# Patient Record
Sex: Female | Born: 1935 | Race: White | Hispanic: Refuse to answer | Smoking: Former smoker
Health system: Southern US, Community
[De-identification: ages and names within clinical notes are randomized; demographics above are authoritative.]

## PROBLEM LIST (undated history)

## (undated) DIAGNOSIS — I509 Heart failure, unspecified: Secondary | ICD-10-CM

## (undated) DIAGNOSIS — I2510 Atherosclerotic heart disease of native coronary artery without angina pectoris: Secondary | ICD-10-CM

## (undated) DIAGNOSIS — I482 Chronic atrial fibrillation, unspecified: Secondary | ICD-10-CM

## (undated) DIAGNOSIS — M792 Neuralgia and neuritis, unspecified: Secondary | ICD-10-CM

## (undated) DIAGNOSIS — I219 Acute myocardial infarction, unspecified: Secondary | ICD-10-CM

## (undated) DIAGNOSIS — I10 Essential (primary) hypertension: Secondary | ICD-10-CM

## (undated) DIAGNOSIS — M1990 Unspecified osteoarthritis, unspecified site: Secondary | ICD-10-CM

## (undated) DIAGNOSIS — E785 Hyperlipidemia, unspecified: Secondary | ICD-10-CM

## (undated) HISTORY — DX: Acute myocardial infarction, unspecified: I21.9

## (undated) HISTORY — PX: LUMBAR SPINE SURGERY: SHX701

## (undated) HISTORY — DX: Chronic atrial fibrillation, unspecified: I48.20

## (undated) HISTORY — DX: Atherosclerotic heart disease of native coronary artery without angina pectoris: I25.10

## (undated) HISTORY — DX: Heart failure, unspecified: I50.9

## (undated) HISTORY — PX: APPENDECTOMY: SHX54

---

## 2003-12-30 ENCOUNTER — Ambulatory Visit: Payer: Self-pay | Admitting: General Practice

## 2004-02-16 ENCOUNTER — Other Ambulatory Visit: Payer: Self-pay

## 2004-03-08 ENCOUNTER — Inpatient Hospital Stay: Payer: Self-pay | Admitting: General Practice

## 2004-06-10 ENCOUNTER — Inpatient Hospital Stay: Payer: Self-pay | Admitting: General Practice

## 2004-07-04 ENCOUNTER — Emergency Department: Payer: Self-pay | Admitting: Emergency Medicine

## 2005-03-04 ENCOUNTER — Ambulatory Visit: Payer: Self-pay | Admitting: Internal Medicine

## 2008-02-27 ENCOUNTER — Emergency Department: Payer: Self-pay | Admitting: Unknown Physician Specialty

## 2008-10-20 ENCOUNTER — Emergency Department: Payer: Self-pay | Admitting: Emergency Medicine

## 2009-03-31 ENCOUNTER — Emergency Department: Payer: Self-pay | Admitting: Emergency Medicine

## 2011-07-11 ENCOUNTER — Emergency Department: Payer: Self-pay | Admitting: Emergency Medicine

## 2011-07-11 LAB — CBC WITH DIFFERENTIAL/PLATELET
Basophil %: 0.5 %
HCT: 39.4 % (ref 35.0–47.0)
HGB: 13.5 g/dL (ref 12.0–16.0)
MCH: 31.4 pg (ref 26.0–34.0)
MCHC: 34.3 g/dL (ref 32.0–36.0)
Monocyte %: 9 %
Neutrophil #: 8 10*3/uL — ABNORMAL HIGH (ref 1.4–6.5)
Neutrophil %: 71.7 %
Platelet: 213 10*3/uL (ref 150–440)
RDW: 14.3 % (ref 11.5–14.5)

## 2011-07-11 LAB — BASIC METABOLIC PANEL
Anion Gap: 6 — ABNORMAL LOW (ref 7–16)
Co2: 28 mmol/L (ref 21–32)
Creatinine: 0.76 mg/dL (ref 0.60–1.30)
EGFR (African American): >60
EGFR (Non-African Amer.): >60
Glucose: 99 mg/dL (ref 65–99)
Osmolality: 279 (ref 275–301)
Potassium: 3.4 mmol/L — ABNORMAL LOW (ref 3.5–5.1)
Sodium: 139 mmol/L (ref 136–145)

## 2013-01-04 ENCOUNTER — Emergency Department: Payer: Self-pay | Admitting: Emergency Medicine

## 2015-04-19 ENCOUNTER — Inpatient Hospital Stay
Admission: EM | Admit: 2015-04-19 | Discharge: 2015-04-25 | DRG: 281 | Disposition: A | Discharge status: Home Health | Payer: Medicare Other | Attending: Internal Medicine | Admitting: Internal Medicine

## 2015-04-19 ENCOUNTER — Encounter: Payer: Self-pay | Admitting: Internal Medicine

## 2015-04-19 ENCOUNTER — Emergency Department: Payer: Medicare Other

## 2015-04-19 DIAGNOSIS — E669 Obesity, unspecified: Secondary | ICD-10-CM | POA: Diagnosis present

## 2015-04-19 DIAGNOSIS — Z66 Do not resuscitate: Secondary | ICD-10-CM | POA: Diagnosis present

## 2015-04-19 DIAGNOSIS — I482 Chronic atrial fibrillation: Secondary | ICD-10-CM | POA: Diagnosis present

## 2015-04-19 DIAGNOSIS — I2510 Atherosclerotic heart disease of native coronary artery without angina pectoris: Secondary | ICD-10-CM | POA: Diagnosis present

## 2015-04-19 DIAGNOSIS — I481 Persistent atrial fibrillation: Secondary | ICD-10-CM | POA: Diagnosis present

## 2015-04-19 DIAGNOSIS — R778 Other specified abnormalities of plasma proteins: Secondary | ICD-10-CM

## 2015-04-19 DIAGNOSIS — I214 Non-ST elevation (NSTEMI) myocardial infarction: Secondary | ICD-10-CM | POA: Diagnosis present

## 2015-04-19 DIAGNOSIS — I110 Hypertensive heart disease with heart failure: Secondary | ICD-10-CM | POA: Diagnosis present

## 2015-04-19 DIAGNOSIS — I5021 Acute systolic (congestive) heart failure: Secondary | ICD-10-CM | POA: Diagnosis present

## 2015-04-19 DIAGNOSIS — I509 Heart failure, unspecified: Secondary | ICD-10-CM | POA: Insufficient documentation

## 2015-04-19 DIAGNOSIS — I5043 Acute on chronic combined systolic (congestive) and diastolic (congestive) heart failure: Secondary | ICD-10-CM | POA: Diagnosis present

## 2015-04-19 DIAGNOSIS — Z87891 Personal history of nicotine dependence: Secondary | ICD-10-CM | POA: Diagnosis not present

## 2015-04-19 DIAGNOSIS — I447 Left bundle-branch block, unspecified: Secondary | ICD-10-CM | POA: Diagnosis present

## 2015-04-19 DIAGNOSIS — G4730 Sleep apnea, unspecified: Secondary | ICD-10-CM | POA: Diagnosis present

## 2015-04-19 DIAGNOSIS — I4819 Other persistent atrial fibrillation: Secondary | ICD-10-CM | POA: Diagnosis present

## 2015-04-19 DIAGNOSIS — E785 Hyperlipidemia, unspecified: Secondary | ICD-10-CM | POA: Diagnosis present

## 2015-04-19 DIAGNOSIS — I480 Paroxysmal atrial fibrillation: Secondary | ICD-10-CM | POA: Diagnosis present

## 2015-04-19 DIAGNOSIS — I5022 Chronic systolic (congestive) heart failure: Secondary | ICD-10-CM

## 2015-04-19 DIAGNOSIS — E876 Hypokalemia: Secondary | ICD-10-CM | POA: Clinically undetermined

## 2015-04-19 DIAGNOSIS — Z7901 Long term (current) use of anticoagulants: Secondary | ICD-10-CM

## 2015-04-19 DIAGNOSIS — I10 Essential (primary) hypertension: Secondary | ICD-10-CM | POA: Diagnosis present

## 2015-04-19 DIAGNOSIS — R7989 Other specified abnormal findings of blood chemistry: Secondary | ICD-10-CM

## 2015-04-19 DIAGNOSIS — Z6833 Body mass index (BMI) 33.0-33.9, adult: Secondary | ICD-10-CM

## 2015-04-19 DIAGNOSIS — I255 Ischemic cardiomyopathy: Secondary | ICD-10-CM | POA: Diagnosis not present

## 2015-04-19 DIAGNOSIS — I5030 Unspecified diastolic (congestive) heart failure: Secondary | ICD-10-CM

## 2015-04-19 DIAGNOSIS — I5033 Acute on chronic diastolic (congestive) heart failure: Secondary | ICD-10-CM

## 2015-04-19 DIAGNOSIS — I5041 Acute combined systolic (congestive) and diastolic (congestive) heart failure: Secondary | ICD-10-CM | POA: Diagnosis present

## 2015-04-19 HISTORY — DX: Essential (primary) hypertension: I10

## 2015-04-19 HISTORY — DX: Unspecified osteoarthritis, unspecified site: M19.90

## 2015-04-19 HISTORY — DX: Neuralgia and neuritis, unspecified: M79.2

## 2015-04-19 HISTORY — DX: Hyperlipidemia, unspecified: E78.5

## 2015-04-19 LAB — FIBRIN DERIVATIVES D-DIMER (ARMC ONLY): Fibrin derivatives D-dimer (ARMC): 708 — ABNORMAL HIGH (ref 0–499)

## 2015-04-19 LAB — COMPREHENSIVE METABOLIC PANEL
ALT: 10 U/L — ABNORMAL LOW (ref 14–54)
ANION GAP: 11 (ref 5–15)
AST: 17 U/L (ref 15–41)
Albumin: 3.8 g/dL (ref 3.5–5.0)
Alkaline Phosphatase: 68 U/L (ref 38–126)
BUN: 13 mg/dL (ref 6–20)
CALCIUM: 9.3 mg/dL (ref 8.9–10.3)
CHLORIDE: 108 mmol/L (ref 101–111)
CO2: 22 mmol/L (ref 22–32)
Creatinine, Ser: 0.64 mg/dL (ref 0.44–1.00)
Glucose, Bld: 108 mg/dL — ABNORMAL HIGH (ref 65–99)
Potassium: 3.3 mmol/L — ABNORMAL LOW (ref 3.5–5.1)
SODIUM: 141 mmol/L (ref 135–145)
Total Bilirubin: 1.2 mg/dL (ref 0.3–1.2)
Total Protein: 7.7 g/dL (ref 6.5–8.1)

## 2015-04-19 LAB — CBC WITH DIFFERENTIAL/PLATELET
BASOS ABS: 0.1 10*3/uL (ref 0–0.1)
BASOS PCT: 1 %
EOS PCT: 2 %
Eosinophils Absolute: 0.2 10*3/uL (ref 0–0.7)
HCT: 42.6 % (ref 35.0–47.0)
Hemoglobin: 14.1 g/dL (ref 12.0–16.0)
LYMPHS PCT: 26 %
Lymphs Abs: 2.5 10*3/uL (ref 1.0–3.6)
MCH: 30.6 pg (ref 26.0–34.0)
MCHC: 33.1 g/dL (ref 32.0–36.0)
MCV: 92.2 fL (ref 80.0–100.0)
MONO ABS: 0.7 10*3/uL (ref 0.2–0.9)
Monocytes Relative: 8 %
NEUTROS ABS: 6.2 10*3/uL (ref 1.4–6.5)
Neutrophils Relative %: 63 %
PLATELETS: 277 10*3/uL (ref 150–440)
RBC: 4.62 MIL/uL (ref 3.80–5.20)
RDW: 16.1 % — AB (ref 11.5–14.5)
WBC: 9.6 10*3/uL (ref 3.6–11.0)

## 2015-04-19 LAB — PROTIME-INR
INR: 3.32
PROTHROMBIN TIME: 33 s — AB (ref 11.4–15.0)

## 2015-04-19 LAB — BRAIN NATRIURETIC PEPTIDE: B NATRIURETIC PEPTIDE 5: 664 pg/mL — AB (ref 0.0–100.0)

## 2015-04-19 LAB — TROPONIN I: TROPONIN I: 0.15 ng/mL — AB (ref <0.031)

## 2015-04-19 LAB — DIGOXIN LEVEL: DIGOXIN LVL: 0.4 ng/mL — AB (ref 0.8–2.0)

## 2015-04-19 MED ORDER — ACETAMINOPHEN 650 MG RE SUPP: 650.0000 mg | Freq: Four times a day (QID) | RECTAL | Status: DC | PRN

## 2015-04-19 MED ORDER — POTASSIUM CHLORIDE CRYS ER 20 MEQ PO TBCR
40.0000 meq | EXTENDED_RELEASE_TABLET | ORAL | Status: AC
  Administered 2015-04-19 – 2015-04-20 (×2): 40 meq via ORAL
  Filled 2015-04-19 (×2): qty 2

## 2015-04-19 MED ORDER — ASPIRIN 81 MG PO CHEW
324.0000 mg | CHEWABLE_TABLET | Freq: Once | ORAL | Status: AC
  Administered 2015-04-19: 324 mg via ORAL
  Filled 2015-04-19: qty 4

## 2015-04-19 MED ORDER — SODIUM CHLORIDE 0.9% FLUSH
3.0000 mL | Freq: Two times a day (BID) | INTRAVENOUS | Status: DC
  Administered 2015-04-19 – 2015-04-21 (×4): 3 mL via INTRAVENOUS

## 2015-04-19 MED ORDER — DIGOXIN 125 MCG PO TABS
0.1250 mg | ORAL_TABLET | Freq: Every day | ORAL | Status: DC
  Administered 2015-04-19 – 2015-04-23 (×5): 0.125 mg via ORAL
  Filled 2015-04-19 (×5): qty 1

## 2015-04-19 MED ORDER — POTASSIUM CHLORIDE CRYS ER 20 MEQ PO TBCR
20.0000 meq | EXTENDED_RELEASE_TABLET | Freq: Two times a day (BID) | ORAL | Status: AC
  Administered 2015-04-20 (×2): 20 meq via ORAL
  Filled 2015-04-19 (×2): qty 1

## 2015-04-19 MED ORDER — SODIUM CHLORIDE 0.9 % IV SOLN: 250.0000 mL | INTRAVENOUS | Status: DC | PRN

## 2015-04-19 MED ORDER — SODIUM CHLORIDE 0.9% FLUSH: 3.0000 mL | INTRAVENOUS | Status: DC | PRN

## 2015-04-19 MED ORDER — WARFARIN SODIUM 2 MG PO TABS: 2.0000 mg | ORAL_TABLET | Freq: Once | ORAL | Status: DC

## 2015-04-19 MED ORDER — FUROSEMIDE 10 MG/ML IJ SOLN
40.0000 mg | Freq: Two times a day (BID) | INTRAMUSCULAR | Status: DC
  Administered 2015-04-20: 40 mg via INTRAVENOUS
  Filled 2015-04-19: qty 4

## 2015-04-19 MED ORDER — POLYETHYLENE GLYCOL 3350 17 G PO PACK: 17.0000 g | PACK | Freq: Every day | ORAL | Status: DC | PRN

## 2015-04-19 MED ORDER — NITROGLYCERIN 2 % TD OINT
0.5000 [in_us] | TOPICAL_OINTMENT | Freq: Once | TRANSDERMAL | Status: AC
  Administered 2015-04-19: 0.5 [in_us] via TOPICAL
  Filled 2015-04-19: qty 1

## 2015-04-19 MED ORDER — ACETAMINOPHEN 325 MG PO TABS
650.0000 mg | ORAL_TABLET | Freq: Four times a day (QID) | ORAL | Status: DC | PRN
  Administered 2015-04-21 – 2015-04-25 (×6): 650 mg via ORAL
  Filled 2015-04-19 (×6): qty 2

## 2015-04-19 MED ORDER — ASPIRIN EC 81 MG PO TBEC: 81.0000 mg | DELAYED_RELEASE_TABLET | Freq: Every day | ORAL | Status: DC

## 2015-04-19 MED ORDER — ONDANSETRON HCL 4 MG PO TABS
4.0000 mg | ORAL_TABLET | Freq: Four times a day (QID) | ORAL | Status: DC | PRN
Start: 2015-04-19 — End: 2015-04-25

## 2015-04-19 MED ORDER — ONDANSETRON HCL 4 MG/2ML IJ SOLN: 4.0000 mg | Freq: Four times a day (QID) | INTRAMUSCULAR | Status: DC | PRN

## 2015-04-19 MED ORDER — TRIAMTERENE-HCTZ 37.5-25 MG PO TABS
2.0000 | ORAL_TABLET | Freq: Every day | ORAL | Status: DC
  Administered 2015-04-19: 2 via ORAL
  Filled 2015-04-19 (×2): qty 2

## 2015-04-19 MED ORDER — FUROSEMIDE 10 MG/ML IJ SOLN
20.0000 mg | Freq: Once | INTRAMUSCULAR | Status: AC
  Administered 2015-04-19: 20 mg via INTRAVENOUS
  Filled 2015-04-19: qty 4

## 2015-04-19 MED ORDER — SODIUM CHLORIDE 0.9% FLUSH
3.0000 mL | Freq: Two times a day (BID) | INTRAVENOUS | Status: DC
Start: 2015-04-19 — End: 2015-04-25
  Administered 2015-04-21 – 2015-04-24 (×5): 3 mL via INTRAVENOUS

## 2015-04-19 MED ORDER — TRIAMTERENE-HCTZ 37.5-25 MG PO TABS: 2.0000 | ORAL_TABLET | Freq: Every day | ORAL | Status: DC

## 2015-04-19 MED ORDER — ALBUTEROL SULFATE (2.5 MG/3ML) 0.083% IN NEBU
5.0000 mg | INHALATION_SOLUTION | Freq: Once | RESPIRATORY_TRACT | Status: AC
  Administered 2015-04-19: 5 mg via RESPIRATORY_TRACT
  Filled 2015-04-19: qty 6

## 2015-04-19 MED ORDER — TRIAMTERENE-HCTZ 37.5-25 MG PO TABS
1.0000 | ORAL_TABLET | Freq: Every day | ORAL | Status: DC
  Filled 2015-04-19: qty 1

## 2015-04-19 MED ORDER — METOPROLOL SUCCINATE ER 50 MG PO TB24
50.0000 mg | ORAL_TABLET | Freq: Every day | ORAL | Status: DC
  Administered 2015-04-20 – 2015-04-22 (×3): 50 mg via ORAL
  Filled 2015-04-19 (×3): qty 1

## 2015-04-19 MED ORDER — BISACODYL 10 MG RE SUPP: 10.0000 mg | Freq: Every day | RECTAL | Status: DC | PRN

## 2015-04-19 NOTE — ED Notes (Signed)
CXR report reviewed.

## 2015-04-19 NOTE — H&P (Signed)
[facility] - [place] at [facility]   PATIENT NAME: Kathy Lowery    MR#:  [id number]  DATE OF BIRTH:  1924-04-03  DATE OF ADMISSION:  04/19/2015  PRIMARY CARE PHYSICIAN: Danella Penton., MD   REQUESTING/REFERRING PHYSICIAN: Dr. Darnelle Catalan  CHIEF COMPLAINT:   Chief Complaint  Patient presents with  . Cough    HISTORY OF PRESENT ILLNESS:  Kathy Lowery  is a 82 y.o. female with a known history of atrial fibrillation, hypertension presents to the emergency room complaining of worsening lower extremity swelling and shortness of breath. Patient has also had cough. She complains of pleuritic chest pain on coughing. Patient has had chronic swelling in her legs left greater than right. Presently the both the same and have worsened over the past few weeks. She has started feeling more short of breath since Thursday. She has been trying to eat healthy with a lot of soups but chose to use canned [employer] which have high sodium content. Not on Lasix at home. Also her blood pressure has been elevated with systolic greater than 200.  PAST MEDICAL HISTORY:   Past Medical History  Diagnosis Date  . HTN (hypertension)   . Hyperlipidemia   . A-fib (HCC)   . Peripheral neuralgia   . Arthritis     PAST SURGICAL HISTORY:   Past Surgical History  Procedure Laterality Date  . Appendectomy    . Lumbar spine surgery      SOCIAL HISTORY:   Social History  Substance Use Topics  . Smoking status: Former Games developer  . Smokeless tobacco: Not on file  . Alcohol Use: Not on file    FAMILY HISTORY:   Family History  Problem Relation Age of Onset  . CAD Father   . Hypertension Father     DRUG ALLERGIES:  No Known Allergies  REVIEW OF SYSTEMS:   Review of Systems  Constitutional: Positive for malaise/fatigue. Negative for fever, chills and weight loss.  HENT: Negative for hearing loss and nosebleeds.   Eyes: Negative for blurred vision, double vision and pain.   Respiratory: Positive for cough and shortness of breath. Negative for hemoptysis, sputum production and wheezing.   Cardiovascular: Positive for orthopnea and leg swelling. Negative for chest pain and palpitations.  Gastrointestinal: Negative for nausea, vomiting, abdominal pain, diarrhea and constipation.  Genitourinary: Negative for dysuria and hematuria.  Musculoskeletal: Negative for myalgias, back pain and falls.  Skin: Negative for rash.  Neurological: Positive for weakness. Negative for dizziness, tremors, sensory change, speech change, focal weakness, seizures and headaches.  Endo/Heme/Allergies: Does not bruise/bleed easily.  Psychiatric/Behavioral: Negative for depression and memory loss. The patient is not nervous/anxious.     MEDICATIONS AT HOME:   Prior to Admission medications   Not on File      VITAL SIGNS:  Blood pressure 155/106, pulse 103, temperature 97.9 F (36.6 C), temperature source Oral, resp. rate 27, height  (1.6 m), weight 81.647 kg (180 lb), SpO2 87 %.  PHYSICAL EXAMINATION:  Physical Exam  GENERAL:  82 y.o.-year-old patient lying in the bed with no acute distress.  EYES: Pupils equal, round, reactive to light and accommodation. No scleral icterus. Extraocular muscles intact.  HEENT: Head atraumatic, normocephalic. Oropharynx and nasopharynx clear. No oropharyngeal erythema, moist oral mucosa  NECK:  Supple, no jugular venous distention. No thyroid enlargement, no tenderness.  LUNGS: Normal breath sounds bilaterally, no wheezing, rales, rhonchi. No use of accessory muscles of respiration.  CARDIOVASCULAR: S1, S2 normal. No  murmurs, rubs, or gallops.  ABDOMEN: Soft, nontender, nondistended. Bowel sounds present. No organomegaly or mass.  EXTREMITIES: No pedal edema, cyanosis, or clubbing. + 2 pedal & radial pulses b/l.   NEUROLOGIC: Cranial nerves II through XII are intact. No focal Motor or sensory deficits appreciated b/l PSYCHIATRIC: The patient  is alert and oriented x 3. Good affect.  SKIN: No obvious rash, lesion, or ulcer.   LABORATORY PANEL:   CBC  Recent Labs Lab 04/19/15 1932  WBC 9.6  HGB 14.1  HCT 42.6  PLT 277   ------------------------------------------------------------------------------------------------------------------  Chemistries   Recent Labs Lab 04/19/15 1932  NA 141  K 3.3*  CL 108  CO2 22  GLUCOSE 108*  BUN 13  CREATININE 0.64  CALCIUM 9.3  AST 17  ALT 10*  ALKPHOS 68  BILITOT 1.2   ------------------------------------------------------------------------------------------------------------------  Cardiac Enzymes  Recent Labs Lab 04/19/15 1932  TROPONINI 0.15*   ------------------------------------------------------------------------------------------------------------------  RADIOLOGY:  Dg Chest 2 View  04/19/2015  CLINICAL DATA:  Dry cough and sinus pressure as well as weakness. EXAM: CHEST  2 VIEW COMPARISON:  None. FINDINGS: Lungs are adequately inflated and demonstrate small bilateral pleural effusions likely with associated bibasilar atelectasis. There is mild prominence of the perihilar markings suggesting mild degree of vascular congestion. Mild to moderate cardiomegaly. Calcified plaque is present over the thoracoabdominal aorta. There are mild degenerate changes of the spine. Minimal anterior wedging of a vertebrae at the thoracolumbar junction likely chronic. IMPRESSION: Moderate cardiomegaly with mild vascular congestion and small bilateral pleural effusions likely with associated bibasilar atelectasis. Electronically Signed   By: Elberta Fortis M.D.   On: 04/19/2015 17:28     IMPRESSION AND PLAN:   * Acute on chronic congestive heart failure. Diastolic versus systolic. Ordered echocardiogram. - IV Lasix, Beta blockers - Input and Output - Counseled to limit fluids and Salt - Monitor Bun/Cr and Potassium -Cardiology follow up after discharge  * Elevated troponin  likely from CHF. No chest pain Echocardiogram ordered. 2 more sets of troponin.  * Accelerated hypertension likely from CHF. Continue patient's metoprolol and Maxzide. Nitropaste applied which will help. May need to add lisinopril if blood pressure is still elevated.  * Atrial fibrillation, rate controlled Continue digoxin. Warfarin. Metoprolol.  * DVT prophylaxis She is on warfarin  All the records are reviewed and case discussed with ED provider. Management plans discussed with the patient, family and they are in agreement.  CODE STATUS: DNR  TOTAL TIME TAKING CARE OF THIS PATIENT: 45 minutes.    Milagros Loll R M.D on 04/19/2015 at 9:08 PM  Between 7am to 6pm - Pager - [phone]  After 6pm go to [email or web address] - password [email or web address]  [place] [facility]  Office  [phone]  CC: Primary care physician; Danella Penton., MD   Note: This dictation was prepared with Dragon dictation along with smaller phrase technology. Any transcriptional errors that result from this process are unintentional.

## 2015-04-19 NOTE — ED Notes (Signed)
Daughter [facility] Kashina Mecum) called for update, her number is [phone]

## 2015-04-19 NOTE — ED Notes (Signed)
Called lab to add on bloodwork per MD order

## 2015-04-19 NOTE — ED Provider Notes (Signed)
[facility] Emergency Department Provider Note  ____________________________________________  Time seen: Approximately 7:27 PM  I have reviewed the triage vital signs and the nursing notes.   HISTORY  Chief Complaint Cough    HPI Kathy Lowery is a 82 y.o. female who complains of a dry cough for several days. She is getting a little bit short of breath as well. In her legs are swelling. The right one particularly has swollen up more than before. They're both swollen about the same amount now. She says she can feel her chest. She denies any chest pain tightness or discomfort.  Past medical history includes hypertension No past medical history on file.  There are no active problems to display for this patient.   No past surgical history on file.  No current outpatient prescriptions on file.  Allergies Review of patient's allergies indicates no known allergies.  No family history on file.  Social History Social History  Substance Use Topics  . Smoking status: Not on file  . Smokeless tobacco: Not on file  . Alcohol Use: Not on file   patient does not smoke  Review of Systems Constitutional: No fever/chills Eyes: No visual changes. ENT: No sore throat. Cardiovascular: Denies chest pain. Respiratory:shortness of breath. Gastrointestinal: No abdominal pain.  No nausea, no vomiting.  No diarrhea.  No constipation. Genitourinary: Negative for dysuria. Musculoskeletal: Negative for back pain. Skin: Negative for rash. Neurological: Negative for headaches, focal weakness or numbness.  10-point ROS otherwise negative.  ____________________________________________   PHYSICAL EXAM:  VITAL SIGNS: ED Triage Vitals  Enc Vitals Group     BP 04/19/15 1641 206/78 mmHg     Pulse Rate 04/19/15 1641 57     Resp 04/19/15 1641 22     Temp 04/19/15 1641 97.9 F (36.6 C)     Temp Source 04/19/15 1641 Oral     SpO2 04/19/15 1641 95 %     Weight 04/19/15  1641 180 lb (81.647 kg)     Height 04/19/15 1641  (1.6 m)     Head Cir --      Peak Flow --      Pain Score 04/19/15 1643 4     Pain Loc --      Pain Edu? --      Excl. in GC? --     Constitutional: Alert and oriented. Well appearing and in no acute distress. Eyes: Conjunctivae are normal. PERRL. EOMI. Head: Atraumatic. Nose: No congestion/rhinnorhea. Mouth/Throat: Mucous membranes are moist.  Oropharynx non-erythematous. Neck: No stridor.  Cardiovascular: Normal rate, regular rhythm. Grossly normal heart sounds.  Good peripheral circulation. Respiratory: Normal respiratory effort.  No retractions. Lungs CTAB. Gastrointestinal: Soft and nontender. No distention. No abdominal bruits. No CVA tenderness. Musculoskeletal: No lower extremity tenderness nor edema.  No joint effusions. Neurologic:  Normal speech and language. No gross focal neurologic deficits are appreciated. No gait instability. Skin:  Skin is warm, dry and intact. No rash noted. Psychiatric: Mood and affect are normal. Speech and behavior are normal.  ____________________________________________   LABS (all labs ordered are listed, but only abnormal results are displayed)  Labs Reviewed  COMPREHENSIVE METABOLIC PANEL - Abnormal; Notable for the following:    Potassium 3.3 (*)    Glucose, Bld 108 (*)    ALT 10 (*)    All other components within normal limits  BRAIN NATRIURETIC PEPTIDE - Abnormal; Notable for the following:    B Natriuretic Peptide 664.0 (*)    All  other components within normal limits  TROPONIN I - Abnormal; Notable for the following:    Troponin I 0.15 (*)    All other components within normal limits  CBC WITH DIFFERENTIAL/PLATELET - Abnormal; Notable for the following:    RDW 16.1 (*)    All other components within normal limits  PROTIME-INR - Abnormal; Notable for the following:    Prothrombin Time 33.0 (*)    All other components within normal limits  FIBRIN DERIVATIVES D-DIMER  ([facility] ONLY)  DIGOXIN LEVEL   ____________________________________________  EKG EKG read and interpreted by me shows A. fib with controlled ventricular response. Heart rate of 59. Left axis. EKG looks similar to prior however there is new ST segment depression with T-wave inversion in V6. ____________________________________________  RADIOLOGY  Chest x-ray read by radiology as pulmonary vascular congestion with some cardiomegaly___________________________________________   PROCEDURES   ____________________________________________   INITIAL IMPRESSION / ASSESSMENT AND PLAN / ED COURSE  Pertinent labs & imaging results that were available during my care of the patient were reviewed by me and considered in my medical decision making (see chart for details).  ____________________________________________   FINAL CLINICAL IMPRESSION(S) / ED DIAGNOSES  Final diagnoses:  Acute systolic congestive heart failure (HCC)  Elevated troponin I level      Arnaldo Natal, MD 04/19/15 2035

## 2015-04-19 NOTE — ED Notes (Signed)
Called Tele nurse for report x 1.

## 2015-04-19 NOTE — ED Notes (Signed)
Pt here via taxi from [facility] [place], states that she has a dry cough, pressure in her sinuses, feeling weak, states that when its time for her discharge she will take the taxi back

## 2015-04-20 ENCOUNTER — Encounter: Payer: Self-pay | Admitting: Physician Assistant

## 2015-04-20 DIAGNOSIS — I509 Heart failure, unspecified: Secondary | ICD-10-CM

## 2015-04-20 DIAGNOSIS — I214 Non-ST elevation (NSTEMI) myocardial infarction: Secondary | ICD-10-CM

## 2015-04-20 LAB — MAGNESIUM: Magnesium: 1.5 mg/dL — ABNORMAL LOW (ref 1.7–2.4)

## 2015-04-20 LAB — BASIC METABOLIC PANEL
ANION GAP: 9 (ref 5–15)
BUN: 13 mg/dL (ref 6–20)
CALCIUM: 9.1 mg/dL (ref 8.9–10.3)
CO2: 23 mmol/L (ref 22–32)
Chloride: 108 mmol/L (ref 101–111)
Creatinine, Ser: 0.77 mg/dL (ref 0.44–1.00)
Glucose, Bld: 120 mg/dL — ABNORMAL HIGH (ref 65–99)
POTASSIUM: 3.2 mmol/L — AB (ref 3.5–5.1)
Sodium: 140 mmol/L (ref 135–145)

## 2015-04-20 LAB — MRSA PCR SCREENING: MRSA BY PCR: NEGATIVE

## 2015-04-20 LAB — TROPONIN I
Troponin I: 1.05 ng/mL — ABNORMAL HIGH (ref <0.031)
Troponin I: 0.95 ng/mL — ABNORMAL HIGH (ref <0.031)

## 2015-04-20 MED ORDER — LISINOPRIL 5 MG PO TABS
5.0000 mg | ORAL_TABLET | Freq: Every day | ORAL | Status: DC
  Administered 2015-04-20 – 2015-04-21 (×2): 5 mg via ORAL
  Filled 2015-04-20 (×2): qty 1

## 2015-04-20 MED ORDER — FUROSEMIDE 10 MG/ML IJ SOLN
20.0000 mg | Freq: Two times a day (BID) | INTRAMUSCULAR | Status: DC
  Administered 2015-04-20 – 2015-04-22 (×5): 20 mg via INTRAVENOUS
  Filled 2015-04-20 (×5): qty 2

## 2015-04-20 MED ORDER — ATORVASTATIN CALCIUM 20 MG PO TABS
20.0000 mg | ORAL_TABLET | Freq: Every day | ORAL | Status: DC
  Administered 2015-04-20 – 2015-04-21 (×2): 20 mg via ORAL
  Filled 2015-04-20 (×2): qty 1

## 2015-04-20 MED ORDER — WARFARIN - PHARMACIST DOSING INPATIENT
Freq: Every day | Status: DC
  Administered 2015-04-20 – 2015-04-21 (×2)

## 2015-04-20 MED ORDER — WARFARIN SODIUM 1 MG PO TABS
1.0000 mg | ORAL_TABLET | Freq: Once | ORAL | Status: AC
  Administered 2015-04-20: 1 mg via ORAL
  Filled 2015-04-20: qty 1

## 2015-04-20 MED ORDER — MAGNESIUM OXIDE 400 (241.3 MG) MG PO TABS
400.0000 mg | ORAL_TABLET | Freq: Two times a day (BID) | ORAL | Status: DC
  Administered 2015-04-20 – 2015-04-25 (×11): 400 mg via ORAL
  Filled 2015-04-20 (×11): qty 1

## 2015-04-20 MED ORDER — MAGNESIUM SULFATE 2 GM/50ML IV SOLN
2.0000 g | Freq: Once | INTRAVENOUS | Status: AC
  Administered 2015-04-20: 2 g via INTRAVENOUS
  Filled 2015-04-20: qty 50

## 2015-04-20 NOTE — Progress Notes (Signed)
Pt admitted to room 242. A&Ox4, VSS, no complaints of pain. Pt oriented to unit and room, educated on call bell, telephone, bed alarm, and need to call nursing before up out of bed. Telemetry verified with [place], [place]. Skin assessed with Deatra James, RN. RN will monitor and continue to treat per MD orders. Syliva Overman, RN

## 2015-04-20 NOTE — Progress Notes (Signed)
Pt's troponin elevated to 1.05. MD Dr. Elpidio Anis notified, orders placed for cardiology consult. RN will continue to monitor. Syliva Overman, RN

## 2015-04-20 NOTE — Consult Note (Signed)
ANTICOAGULATION CONSULT NOTE - Initial Consult  Pharmacy Consult for Warfarin Indication: atrial fibrillation  No Known Allergies  Patient Measurements: Height:  (160 cm) Weight: 194 lb 11.2 oz (88.315 kg) IBW/kg (Calculated) : 52.4  Vital Signs: Temp: 97.7 F (36.5 C) (02/20 1142) Temp Source: Oral (02/20 0408) BP: 171/79 mmHg (02/20 1142) Pulse Rate: 65 (02/20 1142)  Labs:  Recent Labs  04/19/15 1932 04/20/15 0052 04/20/15 0558  HGB 14.1  --   --   HCT 42.6  --   --   PLT 277  --   --   LABPROT 33.0*  --   --   INR 3.32  --   --   CREATININE 0.64 0.77  --   TROPONINI 0.15* 1.05* 0.95*    Estimated Creatinine Clearance: 51.3 mL/min (by C-G formula based on Cr of 0.77).   Medical History: Past Medical History  Diagnosis Date  . HTN (hypertension)   . Hyperlipidemia   . PAF (paroxysmal atrial fibrillation) (HCC)     a. on Coumadin; b. CHADS2VASc = 5 (CHF, HTN, age x 2, female)  . Peripheral neuralgia   . Arthritis     Medications:  Scheduled:  . atorvastatin  20 mg Oral q1800  . digoxin  0.125 mg Oral Daily  . furosemide  20 mg Intravenous BID  . lisinopril  5 mg Oral Daily  . magnesium oxide  400 mg Oral BID  . metoprolol succinate  50 mg Oral Daily  . sodium chloride flush  3 mL Intravenous Q12H  . sodium chloride flush  3 mL Intravenous Q12H  . warfarin  2 mg Oral ONCE-1800  . Warfarin - Pharmacist Dosing Inpatient   Does not apply q1800    Assessment: Kathy Lowery is an 82 yo female admitted for increased SOB and LE edema 2/2 suspected CHF exacerbation. She is on warfarin for Afib. Pharmacy consulted to dose warfarin in this patient.  PTA patient on warfarin  daily.  Goal of Therapy:  INR 2-3 Monitor platelets by anticoagulation protocol: Yes   Plan:  INR on admission is supratherapeutic at 3.32. Patient is not on any antibiotics or other medications that could be causing this elevation in INR.   Will initiate warfarin  for tonight and  check INR in AM.  Cy Blamer 04/20/2015,2:39 PM

## 2015-04-20 NOTE — Consult Note (Signed)
Cardiology Consultation Note  Patient ID: Kathy Lowery, MRN: [id number], DOB/AGE: 1925-02-13 82 y.o. Admit date: 04/19/2015   Date of Consult: 04/20/2015 Primary Physician: Danella Penton., MD Primary Cardiologist: New to [facility]  Chief Complaint: SOB Reason for Consult: Elevated troponin and CHF exacerbation   HPI: 82 y.o. female with h/o PAF on Coumadin, presumed chronic diastolic CHF, HTN, HLD, and obesity who presented to [facility] on 2/19 with worsening SOB.   She has been followed by her PCP for her PAF. She is unable to tell me exactly when she was diagnosed with Afib, but reports when she gets upset she will go into Afib. He follows her INR. No prior echocardiograms, stress tests, or cardiac catheterizations for review. Her last documented weight at her PCP office was 192 pounds. She has been sleeping in a recliner for the past 1-2 years secondary to orthopnea. She notes intermittent increasing lower extremity edema that never fully resolves. She previously ate canned chicken noodle soup almost daily as she reports this to be the Jewish penicillin. She has most recently started to make her own chicken noodle soup, though still adds some salt. She does not eat out at restaurants. She does not weight herself at home. She has not had any chest pain throughout any of the above. Her PCP has been managing her orthopnea and lower extremity swelling with HCTZ. She was prior smoker at 1 ppd from age 59 until age 74. She denies any illegal drug abuse and rarely drank wine, but does not drink etoh any longer. She tries to avoid stairs. She walks with a walker. She denies early satiety. Her lower extremity swelling has been getting worse and she developed a dry, nonproductive cough prompting her to come to [facility]. She also notes unexpected weight loss. She does not check her BP at home.   Upon the patient's arrival to [facility] they were found to have a troponin of 0.15-->1.05-->0.95, BNP 664, D-dimer 708, K+ 3.3-->3.2,  Mg++ 1.5, digoxin 0.4, INR 3.32. Blood pressure was found to be 206/78 upon her arrival tot he ED which has improved to 117/92 at time of cardiology consult. She was admitted with a weight of 180 pounds. ECG showed Afib with slow ventricular response, 59 bpm, LBBB (old), CXR showed moderate cardiomegaly with mild vascular congestion and small bilateral pleural effusion likely associated with bibasilar atelectasis. She received 20 mg of IV Lasix x 2 with a UOP of 2825 mL for the admission thus far. Echo is pending.   Past Medical History  Diagnosis Date  . HTN (hypertension)   . Hyperlipidemia   . PAF (paroxysmal atrial fibrillation) (HCC)     a. on Coumadin; b. CHADS2VASc = 5 (CHF, HTN, age x 2, female)  . Peripheral neuralgia   . Arthritis       Most Recent Cardiac Studies: None.   Surgical History:  Past Surgical History  Procedure Laterality Date  . Appendectomy    . Lumbar spine surgery       Home Meds: Prior to Admission medications   Medication Sig Start Date End Date Taking? Authorizing Provider  digoxin (LANOXIN) 0.125 MG tablet Take 0.125 mg by mouth daily.   Yes Historical Provider, MD  gemfibrozil (LOPID) 600 MG tablet Take 600 mg by mouth 2 (two) times daily before a meal.   Yes Historical Provider, MD  metoprolol succinate (TOPROL-XL) 50 MG 24 hr tablet Take 50 mg by mouth daily. Take with or immediately following a meal.   Yes  Historical Provider, MD  triamterene-hydrochlorothiazide (MAXZIDE-25) 37.5-25 MG tablet Take 1 tablet by mouth daily.   Yes Historical Provider, MD  warfarin (COUMADIN) 2 MG tablet Take 2 mg by mouth daily.   Yes Historical Provider, MD    Inpatient Medications:  . aspirin EC  81 mg Oral Daily  . digoxin  0.125 mg Oral Daily  . furosemide  40 mg Intravenous BID  . metoprolol succinate  50 mg Oral Daily  . potassium chloride  20 mEq Oral BID  . sodium chloride flush  3 mL Intravenous Q12H  . sodium chloride flush  3 mL Intravenous Q12H  .  triamterene-hydrochlorothiazide  2 tablet Oral Daily  . warfarin  2 mg Oral ONCE-1800      Allergies: No Known Allergies  Social History   Social History  . Marital Status: Widowed    Spouse Name: N/A  . Number of Children: N/A  . Years of Education: N/A   Occupational History  . Not on file.   Social History Main Topics  . Smoking status: Former Games developer  . Smokeless tobacco: Not on file  . Alcohol Use: Not on file  . Drug Use: Not on file  . Sexual Activity: Not on file   Other Topics Concern  . Not on file   Social History Narrative     Family History  Problem Relation Age of Onset  . CAD Father   . Hypertension Father   . Tuberculosis Mother      Review of Systems: Review of Systems  Constitutional: Positive for weight loss and malaise/fatigue. Negative for fever, chills and diaphoresis.  HENT: Negative for congestion.   Eyes: Negative for discharge and redness.  Respiratory: Positive for cough, shortness of breath and wheezing. Negative for hemoptysis and sputum production.   Cardiovascular: Positive for palpitations, orthopnea, leg swelling and PND. Negative for chest pain and claudication.  Gastrointestinal: Negative for heartburn, nausea, vomiting and abdominal pain.  Musculoskeletal: Negative for myalgias and falls.  Skin: Negative for rash.  Neurological: Positive for weakness. Negative for dizziness, tingling, tremors, sensory change, speech change, focal weakness, seizures, loss of consciousness and headaches.  Endo/Heme/Allergies: Does not bruise/bleed easily.  Psychiatric/Behavioral: Negative for substance abuse. The patient is not nervous/anxious.   All other systems reviewed and are negative.   Labs:  Recent Labs  04/19/15 1932 04/20/15 0052 04/20/15 0558  TROPONINI 0.15* 1.05* 0.95*   Lab Results  Component Value Date   WBC 9.6 04/19/2015   HGB 14.1 04/19/2015   HCT 42.6 04/19/2015   MCV 92.2 04/19/2015   PLT 277 04/19/2015      Recent Labs Lab 04/19/15 1932 04/20/15 0052  NA 141 140  K 3.3* 3.2*  CL 108 108  CO2 22 23  BUN 13 13  CREATININE 0.64 0.77  CALCIUM 9.3 9.1  PROT 7.7  --   BILITOT 1.2  --   ALKPHOS 68  --   ALT 10*  --   AST 17  --   GLUCOSE 108* 120*   No results found for: CHOL, HDL, LDLCALC, TRIG No results found for: DDIMER  Radiology/Studies:  Dg Chest 2 View  04/19/2015  CLINICAL DATA:  Dry cough and sinus pressure as well as weakness. EXAM: CHEST  2 VIEW COMPARISON:  None. FINDINGS: Lungs are adequately inflated and demonstrate small bilateral pleural effusions likely with associated bibasilar atelectasis. There is mild prominence of the perihilar markings suggesting mild degree of vascular congestion. Mild to moderate cardiomegaly. Calcified plaque  is present over the thoracoabdominal aorta. There are mild degenerate changes of the spine. Minimal anterior wedging of a vertebrae at the thoracolumbar junction likely chronic. IMPRESSION: Moderate cardiomegaly with mild vascular congestion and small bilateral pleural effusions likely with associated bibasilar atelectasis. Electronically Signed   By: Elberta Fortis M.D.   On: 04/19/2015 17:28    EKG: Afib with slow ventricular response, 59 bpm, LBBB,   Weights: Filed Weights   04/19/15 1641 04/19/15 2326 04/20/15 0500  Weight: 180 lb (81.647 kg) 194 lb 11.2 oz (88.315 kg) 194 lb 11.2 oz (88.315 kg)     Physical Exam: Blood pressure 117/92, pulse 66, temperature 97.9 F (36.6 C), temperature source Oral, resp. rate 20, height 5\' 3"  (1.6 m), weight 194 lb 11.2 oz (88.315 kg), SpO2 92 %. Body mass index is 34.5 kg/(m^2). General: Well developed, well nourished, in no acute distress. Head: Normocephalic, atraumatic, sclera non-icteric, no xanthomas, nares are without discharge.  Neck: Negative for carotid bruits. JVD elevated. Lungs: Decreased breath sounds bilaterally with crackles along the bilateral bases. Breathing is  unlabored. Heart: Afib with bradycardic rate, with S1 S2. No murmurs, rubs, or gallops appreciated. Abdomen: Soft, non-tender, non-distended with normoactive bowel sounds. No hepatomegaly. No rebound/guarding. No obvious abdominal masses. Msk:  Strength and tone appear normal for age. Extremities: No clubbing or cyanosis. 1+ bilateral edema.  Distal pedal pulses are 2+ and equal bilaterally. Neuro: Alert and oriented X 3. No facial asymmetry. No focal deficit. Moves all extremities spontaneously. Psych:  Responds to questions appropriately with a normal affect.    Assessment and Plan:   1. Acute on chronic diastolic CHF: -She has diuresed 2825 mL so far with IV Lasix 20 mg x 2. She is scheduled to received IV Lasix 40 mg bid at this time, agree with this dose as she continues to be volume overloaded.  -Echo is pending to evaluate LV systolic function, wall motion, and right-sided pressure -Continue Toprol 50 mg daily -Would avoid Maxzide usage given she is on Lasix in an effort to avoid worsening hypokalemia -Will consult nutrition  -Daily weights and strict Is and Os  2. Elevated troponin: -Peak of 1.05, asymptomatic -No prior ischemic evaluation -Echo pending as above -She will need a cardiac catheterization -She has received full-dose aspirin x 1, can discontinue aspirin 81 mg as she is on Coumadin   3. PAF: -Rate controlled -Likely in the setting of her volume overload  -Continue Toprol-XL 50 mg and low-dose digoxin daily for rate control -Coumadin per pharmacy  -CHADS2VASC at least 5 (CHF, HTN, age x 2, female)  4. Accelerated HTN: -Improved -Avoid Maxzide usage as above. Will replace with lisinopril 5 mg daily -Otherwise continue Toprol-XL as above  5. Hypokalemia: -Replete to 4.0  6. Hypomagnesemia: -Replete to 2.0  7. Obesity: -Possible sleep apnea. She would benefit from an outpatient sleep study -These are likely also driving her PAF   Elinor Dodge,  PA-C Pager: [phone] 04/20/2015, 8:45 AM

## 2015-04-20 NOTE — Care Management Note (Signed)
Case Management Note  Patient Details  Name: Kathy Lowery MRN: [id number] Date of Birth: 09/22/1924  Subjective/Objective:  RNCM assessment for discharge planning. Met with patient at bedside. CHF. Not on home O2. PCP is Dr. Miller, seen in the last 3 weeks. Patient lives at [facility]. She states she is independent, active within the facility. She has a nurse that can come and check on her within the facility and a clinic that is open to the patients. She does not drive. Her daughter is a retired nurse. Offered her a HH RN to come and do further education and follow up on CHF. She declined at this time. RNCM left contact card. Denies issues obtaining medications, copays or transportation.                   Action/Plan: No needs anticipated at this time.  Expected Discharge Date:                  Expected Discharge Plan:  Home/Self Care  In-House Referral:     Discharge planning Services  CM Consult  Post Acute Care Choice:  Home Health Choice offered to:  Patient  DME Arranged:    DME Agency:     HH Arranged:  Patient Refused HH Agency:     Status of Service:  In process, will continue to follow  Medicare Important Message Given:    Date Medicare IM Given:    Medicare IM give by:    Date Additional Medicare IM Given:    Additional Medicare Important Message give by:     If discussed at Long Length of Stay Meetings, dates discussed:    Additional Comments:   M , RN 04/20/2015, 3:21 PM  

## 2015-04-20 NOTE — Progress Notes (Signed)
CSW was informed by RN during progression that patient is from [facility]. CSW contacted Sue Lush- admissions coordinator at [facility]. Per Sue Lush patient is from [facility]. There are currently no CSW needs at this moment. CSW is signing off. CSW is available if a need were to arise.   Woodroe Mode, MSW, LCSW-A Clinical Social Work Department [phone]

## 2015-04-21 ENCOUNTER — Inpatient Hospital Stay (HOSPITAL_COMMUNITY)
Admit: 2015-04-21 | Discharge: 2015-04-21 | Disposition: A | Discharge status: Home / Self Care | Payer: Medicare Other | Attending: Internal Medicine | Admitting: Internal Medicine

## 2015-04-21 DIAGNOSIS — I5021 Acute systolic (congestive) heart failure: Secondary | ICD-10-CM

## 2015-04-21 LAB — BASIC METABOLIC PANEL
ANION GAP: 8 (ref 5–15)
BUN: 19 mg/dL (ref 6–20)
CALCIUM: 9 mg/dL (ref 8.9–10.3)
CO2: 26 mmol/L (ref 22–32)
CREATININE: 0.88 mg/dL (ref 0.44–1.00)
Chloride: 105 mmol/L (ref 101–111)
GFR calc Af Amer: >60 mL/min (ref >60)
GFR, EST NON AFRICAN AMERICAN: 57 mL/min — AB (ref >60)
GLUCOSE: 94 mg/dL (ref 65–99)
Potassium: 2.9 mmol/L — CL (ref 3.5–5.1)
Sodium: 139 mmol/L (ref 135–145)

## 2015-04-21 LAB — MAGNESIUM: MAGNESIUM: 1.9 mg/dL (ref 1.7–2.4)

## 2015-04-21 LAB — POTASSIUM: Potassium: 3.4 mmol/L — ABNORMAL LOW (ref 3.5–5.1)

## 2015-04-21 LAB — PROTIME-INR
INR: 2.26
Prothrombin Time: 24.7 seconds — ABNORMAL HIGH (ref 11.4–15.0)

## 2015-04-21 MED ORDER — POTASSIUM CHLORIDE CRYS ER 20 MEQ PO TBCR
40.0000 meq | EXTENDED_RELEASE_TABLET | Freq: Once | ORAL | Status: AC
  Administered 2015-04-21: 40 meq via ORAL
  Filled 2015-04-21: qty 2

## 2015-04-21 MED ORDER — WARFARIN SODIUM 1 MG PO TABS
1.0000 mg | ORAL_TABLET | Freq: Every day | ORAL | Status: AC
  Administered 2015-04-21: 1 mg via ORAL
  Filled 2015-04-21: qty 1

## 2015-04-21 MED ORDER — LOSARTAN POTASSIUM 25 MG PO TABS
25.0000 mg | ORAL_TABLET | Freq: Every day | ORAL | Status: DC
  Administered 2015-04-21 – 2015-04-25 (×5): 25 mg via ORAL
  Filled 2015-04-21 (×6): qty 1

## 2015-04-21 MED ORDER — POTASSIUM CHLORIDE CRYS ER 20 MEQ PO TBCR
40.0000 meq | EXTENDED_RELEASE_TABLET | Freq: Two times a day (BID) | ORAL | Status: AC
  Administered 2015-04-21 – 2015-04-22 (×3): 40 meq via ORAL
  Filled 2015-04-21 (×3): qty 2

## 2015-04-21 NOTE — Progress Notes (Signed)
Patient had 7 beat run of V-tach. Notified Dr. Elisabeth Pigeon and Dr. Mariah Milling has been made aware. No new orders. Will continue to monitor. Rudean Haskell

## 2015-04-21 NOTE — Progress Notes (Signed)
[facility] [place] [facility]   PATIENT NAME: Diasha Castleman    MR#:  [id number]  DATE OF BIRTH:  06-Apr-1924  SUBJECTIVE:  CHIEF COMPLAINT:   Chief Complaint  Patient presents with  . Cough    SOB, found to have CHF. Elevated troponin. On coumadin.   Feels better now.   Less edema, had some v tech episodes ontele- non sustained - 4,6 beats only.   With minimal ambulation her O2 dropped < 86.  REVIEW OF SYSTEMS:  ROS  Constitutional: Positive for malaise/fatigue. Negative for fever, chills and weight loss.  HENT: Negative for hearing loss and nosebleeds.  Eyes: Negative for blurred vision, double vision and pain.  Respiratory: Positive for cough and shortness of breath. Negative for hemoptysis, sputum production and wheezing.  Cardiovascular: Positive for orthopnea and leg swelling. Negative for chest pain and palpitations.  Gastrointestinal: Negative for nausea, vomiting, abdominal pain, diarrhea and constipation.  Genitourinary: Negative for dysuria and hematuria.  Musculoskeletal: Negative for myalgias, back pain and falls.  Skin: Negative for rash.  Neurological: Positive for weakness. Negative for dizziness, tremors, sensory change, speech change, focal weakness, seizures and headaches.  Endo/Heme/Allergies: Does not bruise/bleed easily.  Psychiatric/Behavioral: Negative for depression and memory loss. The patient is not nervous/anxious.   DRUG ALLERGIES:  No Known Allergies  VITALS:  Blood pressure 138/54, pulse 56, temperature 97.7 F (36.5 C), temperature source Oral, resp. rate 20, height  (1.6 m), weight 85.684 kg (188 lb 14.4 oz), SpO2 93 %.  PHYSICAL EXAMINATION:   GENERAL: 82 y.o.-year-old patient lying in the bed with no acute distress.  EYES: Pupils equal, round, reactive to light and accommodation. No scleral icterus. Extraocular muscles intact.  HEENT: Head atraumatic, normocephalic. Oropharynx and nasopharynx clear.  No oropharyngeal erythema, moist oral mucosa  NECK: Supple, no jugular venous distention. No thyroid enlargement, no tenderness.  LUNGS: Normal breath sounds bilaterally, no wheezing,some crepitations, no use of accessory respi muscles. CARDIOVASCULAR: S1, S2 normal. No murmurs, rubs, or gallops.  ABDOMEN: Soft, nontender, nondistended. Bowel sounds present. No organomegaly or mass.  EXTREMITIES: No cyanosis, or clubbing. + 2 pedal & radial pulses b/l.  NEUROLOGIC: Cranial nerves II through XII are intact. No focal Motor or sensory deficits appreciated b/l PSYCHIATRIC: The patient is alert and oriented x 3. Good affect.  SKIN: No obvious rash, lesion, or ulcer.  Physical Exam LABORATORY PANEL:   CBC  Recent Labs Lab 04/19/15 1932  WBC 9.6  HGB 14.1  HCT 42.6  PLT 277   ------------------------------------------------------------------------------------------------------------------  Chemistries   Recent Labs Lab 04/19/15 1932  04/21/15 0341 04/21/15 1126  NA 141  < > 139  --   K 3.3*  < > 2.9* 3.4*  CL 108  < > 105  --   CO2 22  < > 26  --   GLUCOSE 108*  < > 94  --   BUN 13  < > 19  --   CREATININE 0.64  < > 0.88  --   CALCIUM 9.3  < > 9.0  --   MG  --   < > 1.9  --   AST 17  --   --   --   ALT 10*  --   --   --   ALKPHOS 68  --   --   --   BILITOT 1.2  --   --   --   < > = values in this interval not displayed. ------------------------------------------------------------------------------------------------------------------  Cardiac Enzymes  Recent Labs Lab 04/20/15 0052 04/20/15 0558  TROPONINI 1.05* 0.95*   ------------------------------------------------------------------------------------------------------------------  RADIOLOGY:  No results found.  ASSESSMENT AND PLAN:   Active Problems:   CHF (congestive heart failure) (HCC)   * Acute on chronic congestive heart failure. Diastolic versus systolic. Ordered echocardiogram. - IV Lasix,  Beta blockers - Input and Output - Counseled to limit fluids and Salt - Monitor Bun/Cr and Potassium - appreciated cardio consult. - started lisinopril, but had cough- so changed to low dose losartan.  * Elevated troponin likely from CHF. No chest pain Echocardiogram ordered. 2 more sets of troponin- had significant rise. Cardio suggested cardiac cath- but pt refused for that, and so medical management for now.  * Accelerated hypertension likely from CHF. Continue patient's metoprolol and Maxzide. Nitropaste applied which will help. Added losartan.  * Atrial fibrillation, rate controlled Continue digoxin. Warfarin. Metoprolol.  * DVT prophylaxis She is on warfarin   All the records are reviewed and case discussed with Care Management/Social Workerr. Management plans discussed with the patient, family and they are in agreement.  CODE STATUS: DNR  TOTAL TIME TAKING CARE OF THIS PATIENT: 35inutes.    POSSIBLE D/C IN 1-2 DAYS, DEPENDING ON CLINICAL CONDITION.   Altamese Dilling M.D on 04/21/2015   Between 7am to 6pm - Pager - [phone]  After 6pm go to [email or web address] - password [email or web address] [facility]  [place]  [facility]  Office  [phone]  CC: Primary care physician; Danella Penton., MD  Note: This dictation was prepared with Dragon dictation along with smaller phrase technology. Any transcriptional errors that result from this process are unintentional.

## 2015-04-21 NOTE — Progress Notes (Signed)
Notified physician of low potassium level, received orders for oral replacement.

## 2015-04-21 NOTE — Progress Notes (Signed)
Initial appointment made at the [facility] on May 05, 2015 at 9:00am. Thank you.

## 2015-04-21 NOTE — Care Management Important Message (Signed)
Important Message  Patient Details  Name: Kathy Lowery MRN: [id number] Date of Birth: 06-11-1924   Medicare Important Message Given:  Yes    Olegario Messier A Georgiann Neider 04/21/2015, 10:21 AM

## 2015-04-21 NOTE — Progress Notes (Signed)
*  PRELIMINARY RESULTS* Echocardiogram 2D Echocardiogram has been performed.  Georgann Housekeeper Hege 04/21/2015, 9:45 AM

## 2015-04-21 NOTE — Consult Note (Signed)
ANTICOAGULATION CONSULT NOTE - Initial Consult  Pharmacy Consult for Warfarin Indication: atrial fibrillation  No Known Allergies  Patient Measurements: Height:  (160 cm) Weight: 188 lb 14.4 oz (85.684 kg) IBW/kg (Calculated) : 52.4  Vital Signs: Temp: 97.3 F (36.3 C) (02/21 0519) Temp Source: Oral (02/21 0519) BP: 125/66 mmHg (02/21 0519) Pulse Rate: 57 (02/21 0519)  Labs:  Recent Labs  04/19/15 1932 04/20/15 0052 04/20/15 0558 04/21/15 0341  HGB 14.1  --   --   --   HCT 42.6  --   --   --   PLT 277  --   --   --   LABPROT 33.0*  --   --  24.7*  INR 3.32  --   --  2.26  CREATININE 0.64 0.77  --  0.88  TROPONINI 0.15* 1.05* 0.95*  --     Estimated Creatinine Clearance: 45.8 mL/min (by C-G formula based on Cr of 0.88).   Medical History: Past Medical History  Diagnosis Date  . HTN (hypertension)   . Hyperlipidemia   . PAF (paroxysmal atrial fibrillation) (HCC)     a. on Coumadin; b. CHADS2VASc = 5 (CHF, HTN, age x 2, female)  . Peripheral neuralgia   . Arthritis     Medications:  Scheduled:  . atorvastatin  20 mg Oral q1800  . digoxin  0.125 mg Oral Daily  . furosemide  20 mg Intravenous BID  . lisinopril  5 mg Oral Daily  . magnesium oxide  400 mg Oral BID  . metoprolol succinate  50 mg Oral Daily  . sodium chloride flush  3 mL Intravenous Q12H  . sodium chloride flush  3 mL Intravenous Q12H  . Warfarin - Pharmacist Dosing Inpatient   Does not apply q1800    Assessment: Kathy Lowery is an 82 yo female admitted for increased SOB and LE edema 2/2 suspected CHF exacerbation. She is on warfarin for Afib. Pharmacy consulted to dose warfarin in this patient.  PTA patient on warfarin  daily.  Goal of Therapy:  INR 2-3 Monitor platelets by anticoagulation protocol: Yes   Plan:  INR on admission is supratherapeutic at 3.32. Patient is not on any antibiotics or other medications that could be causing this elevation in INR. Will initiate warfarin  for  tonight and check INR in AM.  02/21 INR 2.26, given warfarin . As INR is therapeutic, continue  for now. If INR begins to trend down, will increase dose back to PTA regimen. Check INR in AM.  Cy Blamer 04/21/2015,7:10 AM

## 2015-04-21 NOTE — Progress Notes (Signed)
[facility] - [place] at [facility]   PATIENT NAME: Kathy Lowery    MR#:  [id number]  DATE OF BIRTH:  Jan 10, 1925  SUBJECTIVE:  CHIEF COMPLAINT:   Chief Complaint  Patient presents with  . Cough    SOB, found to have CHF. Elevated troponin. On coumadin.   Feels better now.  REVIEW OF SYSTEMS:  ROS  Constitutional: Positive for malaise/fatigue. Negative for fever, chills and weight loss.  HENT: Negative for hearing loss and nosebleeds.  Eyes: Negative for blurred vision, double vision and pain.  Respiratory: Positive for cough and shortness of breath. Negative for hemoptysis, sputum production and wheezing.  Cardiovascular: Positive for orthopnea and leg swelling. Negative for chest pain and palpitations.  Gastrointestinal: Negative for nausea, vomiting, abdominal pain, diarrhea and constipation.  Genitourinary: Negative for dysuria and hematuria.  Musculoskeletal: Negative for myalgias, back pain and falls.  Skin: Negative for rash.  Neurological: Positive for weakness. Negative for dizziness, tremors, sensory change, speech change, focal weakness, seizures and headaches.  Endo/Heme/Allergies: Does not bruise/bleed easily.  Psychiatric/Behavioral: Negative for depression and memory loss. The patient is not nervous/anxious.   DRUG ALLERGIES:  No Known Allergies  VITALS:  Blood pressure 125/66, pulse 57, temperature 97.3 F (36.3 C), temperature source Oral, resp. rate 18, height  (1.6 m), weight 85.684 kg (188 lb 14.4 oz), SpO2 93 %.  PHYSICAL EXAMINATION:   GENERAL: 82 y.o.-year-old patient lying in the bed with no acute distress.  EYES: Pupils equal, round, reactive to light and accommodation. No scleral icterus. Extraocular muscles intact.  HEENT: Head atraumatic, normocephalic. Oropharynx and nasopharynx clear. No oropharyngeal erythema, moist oral mucosa  NECK: Supple, no jugular venous distention. No thyroid enlargement, no  tenderness.  LUNGS: Normal breath sounds bilaterally, no wheezing,soem crepitations, no use of accessory respi muscles. CARDIOVASCULAR: S1, S2 normal. No murmurs, rubs, or gallops.  ABDOMEN: Soft, nontender, nondistended. Bowel sounds present. No organomegaly or mass.  EXTREMITIES: No cyanosis, or clubbing. + 2 pedal & radial pulses b/l.  NEUROLOGIC: Cranial nerves II through XII are intact. No focal Motor or sensory deficits appreciated b/l PSYCHIATRIC: The patient is alert and oriented x 3. Good affect.  SKIN: No obvious rash, lesion, or ulcer.  Physical Exam LABORATORY PANEL:   CBC  Recent Labs Lab 04/19/15 1932  WBC 9.6  HGB 14.1  HCT 42.6  PLT 277   ------------------------------------------------------------------------------------------------------------------  Chemistries   Recent Labs Lab 04/19/15 1932 04/20/15 0052 04/21/15 0341  NA 141 140 139  K 3.3* 3.2* 2.9*  CL 108 108 105  CO2 GLUCOSE 108* 120* 94  BUN CREATININE 0.64 0.77 0.88  CALCIUM 9.3 9.1 9.0  MG  --  1.5*  --   AST 17  --   --   ALT 10*  --   --   ALKPHOS 68  --   --   BILITOT 1.2  --   --    ------------------------------------------------------------------------------------------------------------------  Cardiac Enzymes  Recent Labs Lab 04/20/15 0052 04/20/15 0558  TROPONINI 1.05* 0.95*   ------------------------------------------------------------------------------------------------------------------  RADIOLOGY:  Dg Chest 2 View  04/19/2015  CLINICAL DATA:  Dry cough and sinus pressure as well as weakness. EXAM: CHEST  2 VIEW COMPARISON:  None. FINDINGS: Lungs are adequately inflated and demonstrate small bilateral pleural effusions likely with associated bibasilar atelectasis. There is mild prominence of the perihilar markings suggesting mild degree of vascular congestion. Mild to moderate cardiomegaly. Calcified plaque is present over  the thoracoabdominal  aorta. There are mild degenerate changes of the spine. Minimal anterior wedging of a vertebrae at the thoracolumbar junction likely chronic. IMPRESSION: Moderate cardiomegaly with mild vascular congestion and small bilateral pleural effusions likely with associated bibasilar atelectasis. Electronically Signed   By: Elberta Fortis M.D.   On: 04/19/2015 17:28    ASSESSMENT AND PLAN:   Active Problems:   CHF (congestive heart failure) (HCC)   * Acute on chronic congestive heart failure. Diastolic versus systolic. Ordered echocardiogram. - IV Lasix, Beta blockers - Input and Output - Counseled to limit fluids and Salt - Monitor Bun/Cr and Potassium - appreciated cardio consult.  * Elevated troponin likely from CHF. No chest pain Echocardiogram ordered. 2 more sets of troponin- had significant rise. Cardio may plan for cardiac cath  * Accelerated hypertension likely from CHF. Continue patient's metoprolol and Maxzide. Nitropaste applied which will help. May need to add lisinopril if blood pressure is still elevated.  * Atrial fibrillation, rate controlled Continue digoxin. Warfarin. Metoprolol.  * DVT prophylaxis She is on warfarin     All the records are reviewed and case discussed with Care Management/Social Workerr. Management plans discussed with the patient, family and they are in agreement.  CODE STATUS: DNR  TOTAL TIME TAKING CARE OF THIS PATIENT: 35inutes.     POSSIBLE D/C IN 1-2 DAYS, DEPENDING ON CLINICAL CONDITION.   Altamese Dilling M.D on 04/21/2015   Between 7am to 6pm - Pager - [phone]  After 6pm go to [email or web address] - password [email or web address] [facility]  [place]  Office  [phone]  CC: Primary care physician; Danella Penton., MD  Note: This dictation was prepared with Dragon dictation along with smaller phrase technology. Any transcriptional errors that result from this process are unintentional.

## 2015-04-22 DIAGNOSIS — I4819 Other persistent atrial fibrillation: Secondary | ICD-10-CM | POA: Diagnosis present

## 2015-04-22 DIAGNOSIS — Z7901 Long term (current) use of anticoagulants: Secondary | ICD-10-CM

## 2015-04-22 DIAGNOSIS — I214 Non-ST elevation (NSTEMI) myocardial infarction: Secondary | ICD-10-CM | POA: Diagnosis present

## 2015-04-22 DIAGNOSIS — E876 Hypokalemia: Secondary | ICD-10-CM | POA: Clinically undetermined

## 2015-04-22 DIAGNOSIS — I5030 Unspecified diastolic (congestive) heart failure: Secondary | ICD-10-CM

## 2015-04-22 DIAGNOSIS — I10 Essential (primary) hypertension: Secondary | ICD-10-CM | POA: Diagnosis present

## 2015-04-22 DIAGNOSIS — E669 Obesity, unspecified: Secondary | ICD-10-CM | POA: Diagnosis present

## 2015-04-22 DIAGNOSIS — I255 Ischemic cardiomyopathy: Secondary | ICD-10-CM

## 2015-04-22 DIAGNOSIS — I110 Hypertensive heart disease with heart failure: Secondary | ICD-10-CM | POA: Diagnosis present

## 2015-04-22 DIAGNOSIS — I5041 Acute combined systolic (congestive) and diastolic (congestive) heart failure: Secondary | ICD-10-CM | POA: Diagnosis present

## 2015-04-22 HISTORY — DX: Non-ST elevation (NSTEMI) myocardial infarction: I21.4

## 2015-04-22 LAB — BASIC METABOLIC PANEL
Anion gap: 9 (ref 5–15)
BUN: 26 mg/dL — AB (ref 6–20)
CO2: 26 mmol/L (ref 22–32)
Calcium: 8.8 mg/dL — ABNORMAL LOW (ref 8.9–10.3)
Chloride: 106 mmol/L (ref 101–111)
Creatinine, Ser: 0.85 mg/dL (ref 0.44–1.00)
GFR calc Af Amer: >60 mL/min (ref >60)
GFR, EST NON AFRICAN AMERICAN: 59 mL/min — AB (ref >60)
Glucose, Bld: 99 mg/dL (ref 65–99)
POTASSIUM: 3.7 mmol/L (ref 3.5–5.1)
Sodium: 141 mmol/L (ref 135–145)

## 2015-04-22 LAB — PROTIME-INR
INR: 1.76
Prothrombin Time: 20.5 seconds — ABNORMAL HIGH (ref 11.4–15.0)

## 2015-04-22 MED ORDER — ATORVASTATIN CALCIUM 20 MG PO TABS
40.0000 mg | ORAL_TABLET | Freq: Every day | ORAL | Status: DC
  Administered 2015-04-22 – 2015-04-24 (×3): 40 mg via ORAL
  Filled 2015-04-22 (×3): qty 2

## 2015-04-22 MED ORDER — ASPIRIN 81 MG PO CHEW
81.0000 mg | CHEWABLE_TABLET | ORAL | Status: AC
  Administered 2015-04-23: 81 mg via ORAL
  Filled 2015-04-22: qty 1

## 2015-04-22 MED ORDER — ASPIRIN EC 81 MG PO TBEC
81.0000 mg | DELAYED_RELEASE_TABLET | Freq: Every day | ORAL | Status: DC
  Administered 2015-04-22 – 2015-04-25 (×4): 81 mg via ORAL
  Filled 2015-04-22 (×4): qty 1

## 2015-04-22 MED ORDER — SODIUM CHLORIDE 0.9 % IV SOLN
INTRAVENOUS | Status: DC
  Administered 2015-04-23: 06:00:00 via INTRAVENOUS

## 2015-04-22 MED ORDER — SODIUM CHLORIDE 0.9% FLUSH: 3.0000 mL | INTRAVENOUS | Status: DC | PRN

## 2015-04-22 MED ORDER — SODIUM CHLORIDE 0.9% FLUSH
3.0000 mL | Freq: Two times a day (BID) | INTRAVENOUS | Status: DC
  Administered 2015-04-22: 3 mL via INTRAVENOUS

## 2015-04-22 MED ORDER — SPIRONOLACTONE 25 MG PO TABS
12.5000 mg | ORAL_TABLET | Freq: Once | ORAL | Status: AC
  Administered 2015-04-22: 12.5 mg via ORAL
  Filled 2015-04-22: qty 1

## 2015-04-22 MED ORDER — METOPROLOL SUCCINATE ER 25 MG PO TB24: 25.0000 mg | ORAL_TABLET | Freq: Every day | ORAL | Status: DC

## 2015-04-22 MED ORDER — WARFARIN SODIUM 1 MG PO TABS
1.5000 mg | ORAL_TABLET | Freq: Every day | ORAL | Status: DC
Start: 2015-04-22 — End: 2015-04-22

## 2015-04-22 MED ORDER — SODIUM CHLORIDE 0.9 % IV SOLN: 250.0000 mL | INTRAVENOUS | Status: DC | PRN

## 2015-04-22 MED ORDER — HEPARIN (PORCINE) IN NACL 100-0.45 UNIT/ML-% IJ SOLN
850.0000 [IU]/h | INTRAMUSCULAR | Status: DC
  Administered 2015-04-22: 850 [IU]/h via INTRAVENOUS
  Filled 2015-04-22 (×2): qty 250

## 2015-04-22 MED ORDER — SPIRONOLACTONE 25 MG PO TABS
12.5000 mg | ORAL_TABLET | Freq: Every day | ORAL | Status: DC
  Administered 2015-04-23 – 2015-04-25 (×3): 12.5 mg via ORAL
  Filled 2015-04-22 (×3): qty 1

## 2015-04-22 NOTE — Progress Notes (Addendum)
Patient: Kathy Lowery / Admit Date: 04/19/2015 / Date of Encounter: 04/22/2015, 10:14 AM  Principal Problem:   Acute combined systolic and diastolic heart failure (HCC) Active Problems:   NSTEMI (non-ST elevated myocardial infarction) (HCC)   Persistent atrial fibrillation (HCC)   Accelerated hypertension with diastolic congestive heart failure, NYHA class 3 (HCC)   Current use of long term anticoagulation   Essential hypertension   Obesity (BMI 30-39.9)   Hypokalemia  Subjective: No acute event. Echo showed EF of 30-35%, HK of the anterior and anteroseptal myocardium, LV diastolic function was normal, calcified mitral annulus with mild MR, left atrium was mildly dilated, RV systolic function was normal, PASP was mildly elevated at 36 mm Hg. She has diuresed 5500 mL for the admission to date.   Review of Systems: Review of Systems  Constitutional: Positive for weight loss and malaise/fatigue. Negative for fever, chills and diaphoresis.  HENT: Negative for congestion.   Eyes: Negative for discharge and redness.  Respiratory: Positive for cough and shortness of breath. Negative for hemoptysis, sputum production and wheezing.   Cardiovascular: Positive for leg swelling. Negative for chest pain, palpitations, orthopnea, claudication and PND.  Gastrointestinal: Negative for nausea and vomiting.  Musculoskeletal: Negative for falls.  Skin: Negative for rash.  Neurological: Positive for weakness. Negative for dizziness, tingling, tremors, sensory change, speech change, focal weakness and loss of consciousness.  Endo/Heme/Allergies: Does not bruise/bleed easily.  Psychiatric/Behavioral: The patient is not nervous/anxious.     Objective: Telemetry: Afib, 60's Physical Exam: Blood pressure 146/58, pulse 63, temperature 98.2 F (36.8 C), temperature source Oral, resp. rate 18, height 5\' 3"  (1.6 m), weight 185 lb 12.8 oz (84.278 kg), SpO2 90 %. Body mass index is 32.92 kg/(m^2). General:  Well developed, well nourished, in no acute distress. Head: Normocephalic, atraumatic, sclera non-icteric, no xanthomas, nares are without discharge. Neck: Negative for carotid bruits. JVP not elevated. Lungs: Crackles at the bilateral bases. Breathing is unlabored. Heart: Irregularly-irregular, S1 S2 without murmurs, rubs, or gallops.  Abdomen: Soft, non-tender, non-distended with normoactive bowel sounds. No rebound/guarding. Extremities: No clubbing or cyanosis. No edema. Distal pedal pulses are 2+ and equal bilaterally. Neuro: Alert and oriented X 3. Moves all extremities spontaneously. Psych:  Responds to questions appropriately with a normal affect.   Intake/Output Summary (Last 24 hours) at 04/22/15 1014 Last data filed at 04/22/15 0830  Gross per 24 hour  Intake    723 ml  Output    975 ml  Net   -252 ml    Inpatient Medications:  . atorvastatin  20 mg Oral q1800  . digoxin  0.125 mg Oral Daily  . furosemide  20 mg Intravenous BID  . losartan  25 mg Oral Daily  . magnesium oxide  400 mg Oral BID  . metoprolol succinate  50 mg Oral Daily  . sodium chloride flush  3 mL Intravenous Q12H  . spironolactone  12.5 mg Oral Once  . warfarin  1.5 mg Oral q1800  . Warfarin - Pharmacist Dosing Inpatient   Does not apply q1800   Infusions:    Labs:  Recent Labs  04/20/15 0052 04/21/15 0341 04/21/15 1126 04/22/15 0708  NA 140 139  --  141  K 3.2* 2.9* 3.4* 3.7  CL 108 105  --  106  CO2 23 26  --  26  GLUCOSE 120* 94  --  99  BUN 13 19  --  26*  CREATININE 0.77 0.88  --  0.85  CALCIUM 9.1 9.0  --  8.8*  MG 1.5* 1.9  --   --     Recent Labs  04/19/15 1932  AST 17  ALT 10*  ALKPHOS 68  BILITOT 1.2  PROT 7.7  ALBUMIN 3.8    Recent Labs  04/19/15 1932  WBC 9.6  NEUTROABS 6.2  HGB 14.1  HCT 42.6  MCV 92.2  PLT 277    Recent Labs  04/19/15 1932 04/20/15 0052 04/20/15 0558  TROPONINI 0.15* 1.05* 0.95*   Invalid input(s): POCBNP No results for  input(s): HGBA1C in the last 72 hours.   Weights: Filed Weights   04/20/15 0500 04/21/15 0523 04/22/15 0546  Weight: 194 lb 11.2 oz (88.315 kg) 188 lb 14.4 oz (85.684 kg) 185 lb 12.8 oz (84.278 kg)     Radiology/Studies:  Dg Chest 2 View  04/19/2015  CLINICAL DATA:  Dry cough and sinus pressure as well as weakness. EXAM: CHEST  2 VIEW COMPARISON:  None. FINDINGS: Lungs are adequately inflated and demonstrate small bilateral pleural effusions likely with associated bibasilar atelectasis. There is mild prominence of the perihilar markings suggesting mild degree of vascular congestion. Mild to moderate cardiomegaly. Calcified plaque is present over the thoracoabdominal aorta. There are mild degenerate changes of the spine. Minimal anterior wedging of a vertebrae at the thoracolumbar junction likely chronic. IMPRESSION: Moderate cardiomegaly with mild vascular congestion and small bilateral pleural effusions likely with associated bibasilar atelectasis. Electronically Signed   By: Elberta Fortis M.D.   On: 04/19/2015 17:28     Assessment and Plan   1. Acute on chronic combined CHF: -Echo as above -She has diuresed 5500 mL so far -Continue IV Lasix 20 mg bid, possibly change to PO 2/23 -Continue Toprol-XL 50 mg daily and losartan 25 mg daily -Can add spironolactone at discharge  2. NSTEMI: - was not started on IV heparin 2/2 therapeutic INR; Plan has been for Medical Management (pt not wanting Invasive Evaluation). -Peak of 1.05, asymptomatic -No prior ischemic evaluation -Echo as above with notable regional WMA - suggesting Anterior Infarct (however this reduction in EF is potentially not acute given minimal troponin elevation. -She continues to be uncertain if she would want cardiac cath. She wants to discuss this with her daughter who is a Engineer, civil (consulting) at [employer]. If she would want a cardiac cath she would need to come off Coumadin and be placed on heparin gtt. For now given she is uncertain and  leaning towards no cath continue Coumadin -She has received full-dose aspirin x 1 upon admission, not on aspirin 81 mg as she is on Coumadin, but with likely CAD/MI, would add for short term.  3. PAF: Persistent Afib - Rate controlled on BB -Likely in the setting of her volume overload -She remains on Coumadin -Currently INR < 2 -- Warfarin ordered; pending decision re: Cath, will need INR<1.8. -Continue Toprol-XL 50 mg daily and digoxin 0.125 mg daily -Check a digoxin level -CHADS2VASc at least 5 (CHF, HTN, age x 2, female)  4. Accelerated HTN: -Continue current medications  5. Hypokalemia: -Continue to replete to 4.0  6. Hypomagnesemia: -Replete to 2.0  7. Obesity: -Possible sleep apnea. She would benefit from an outpatient sleep study. -These are likely also driving her PAF    Signed, Eula Listen, PA-C Pager: [phone] 04/22/2015, 9:10 AM  I have seen, examined and evaluated the patient this AM along with Mr. Shea Evans, Cordelia Poche.  After reviewing all the available data and chart,  I agree with his  findings, examination as well as impression recommendations.  Very pleasant elderly woman presented with Acute Combined CHF (no data on Old Echo results to determine chronicity of reduced EF with WMA on current Echo) & Troponin elevation to ~1 suggesting NSTEMI. She was also noted to be in Afib (known PAF on Warfarin for [facility] & BB for rate control).  Dr. Kirke Corin discussed potential Med Rx vs. Invasive Evaluation & pt chose to opt for Med Rx.  This was prior to Echo that demonstrates regional WMA - ? LAD disease.  Still not showing interest in invasive evaluation. - Being on warfarin is a sticky subject if Cath is considered, but INR is currently < 2 (would prefer < 1.8 for cath). * would add ASA given likely CAD.  Statin ordered yesterday - was already on BB & ARB. Remains on IV Lasix - agree with continuing IV Rx today & convert to PO in AM (follow Cr & BUN) - is on Digoxin (keep K+ >4 &  Mg ~2) - agree with Spironolactone - will start 12.5 mg today.   In the absence of recurrent Angina, no acute indication for cath for Symptom relief, it would appear that her infarct has completed.  She will discuss +/- Cath with her daughter.   Marykay Lex, M.D., M.S. Interventional Cardiologist   Pager # [phone] Phone # [phone] [address] [place], [place] [address]

## 2015-04-22 NOTE — Progress Notes (Signed)
Subjective/Objective No subjective & objective note has been filed under this hospital service for this encounter.   Scheduled Meds: . atorvastatin  20 mg Oral q1800  . digoxin  0.125 mg Oral Daily  . furosemide  20 mg Intravenous BID  . losartan  25 mg Oral Daily  . magnesium oxide  400 mg Oral BID  . metoprolol succinate  50 mg Oral Daily  . sodium chloride flush  3 mL Intravenous Q12H  . spironolactone  12.5 mg Oral Once  . warfarin  1.5 mg Oral q1800  . Warfarin - Pharmacist Dosing Inpatient   Does not apply q1800   Continuous Infusions:  PRN Meds:sodium chloride, acetaminophen **OR** acetaminophen, bisacodyl, ondansetron **OR** ondansetron (ZOFRAN) IV, polyethylene glycol, sodium chloride flush  Vital signs in last 24 hours: Temp:  [97.7 F (36.5 C)-98.2 F (36.8 C)] 98.2 F (36.8 C) (02/22 0900) Pulse Rate:  [48-63] 63 (02/22 0900) Resp:  [18-20] 18 (02/22 0900) BP: (115-155)/(54-78) 146/58 mmHg (02/22 0900) SpO2:  [90 %-93 %] 90 % (02/22 0900) Weight:  [185 lb 12.8 oz (84.278 kg)] 185 lb 12.8 oz (84.278 kg) (02/22 0546)  Intake/Output last 3 shifts: I/O last 3 completed shifts: In: 483 [P.O.:480; I.V.:3] Out: 2305 [Urine:2305] Intake/Output this shift: Total I/O In: 240 [P.O.:240] Out: 150 [Urine:150]  Problem Assessment/Plan No new assessment & plan notes have been filed under this hospital service since the last note was generated. Service: Cardiology

## 2015-04-22 NOTE — Progress Notes (Signed)
ANTICOAGULATION CONSULT NOTE - Initial Consult  Pharmacy Consult for Heparin  Indication:Atrial fibrillation   No Known Allergies  Patient Measurements: Height:  (160 cm) Weight: 185 lb 12.8 oz (84.278 kg) IBW/kg (Calculated) : 52.4 Heparin Dosing Weight: 70.3 kg   Vital Signs: Temp: 97.6 F (36.4 C) (02/22 1121) Temp Source: Oral (02/22 0900) BP: 152/56 mmHg (02/22 1121) Pulse Rate: 53 (02/22 1121)  Labs:  Recent Labs  04/19/15 1932 04/20/15 0052 04/20/15 0558 04/21/15 0341 04/22/15 0708  HGB 14.1  --   --   --   --   HCT 42.6  --   --   --   --   PLT 277  --   --   --   --   LABPROT 33.0*  --   --  24.7* 20.5*  INR 3.32  --   --  2.26 1.76  CREATININE 0.64 0.77  --  0.88 0.85  TROPONINI 0.15* 1.05* 0.95*  --   --     Estimated Creatinine Clearance: 47.1 mL/min (by C-G formula based on Cr of 0.85).   Medical History: Past Medical History  Diagnosis Date  . HTN (hypertension)   . Hyperlipidemia   . PAF (paroxysmal atrial fibrillation) (HCC)     a. on Coumadin; b. CHADS2VASc = 5 (CHF, HTN, age x 2, female)  . Peripheral neuralgia   . Arthritis     Medications:  Prescriptions prior to admission  Medication Sig Dispense Refill Last Dose  . digoxin (LANOXIN) 0.125 MG tablet Take 0.125 mg by mouth daily.     Marland Kitchen gemfibrozil (LOPID) 600 MG tablet Take 600 mg by mouth 2 (two) times daily before a meal.     . metoprolol succinate (TOPROL-XL) 50 MG 24 hr tablet Take 50 mg by mouth daily. Take with or immediately following a meal.     . triamterene-hydrochlorothiazide (MAXZIDE-25) 37.5-25 MG tablet Take 1 tablet by mouth daily.     Marland Kitchen warfarin (COUMADIN) 2 MG tablet Take 2 mg by mouth daily.       Assessment: Pharmacy consulted to dose heparin in this 82 year old female with Afib, previously treated with warfarin.  Pt scheduled for cardiac cath.  INR on 2/22 = 1.76 .    Goal of Therapy:  Heparin level 0.3-0.7 units/ml Monitor platelets by anticoagulation  protocol: Yes    Plan:  Warfarin d/c'd , will not bolus heparin in this pt.  Start heparin infusion at 850 units/hr. Will check HL 8 hrs after start of drip on 2/23 @ 1:30.    Layla Gramm D 04/22/2015,5:39 PM

## 2015-04-22 NOTE — Progress Notes (Signed)
  The patient discussed her current medical situation with her family and wishes to proceed with a cardiac catheterization. The patient understands that risks included but are not limited to stroke (1 in 1000), death (1 in 1000), kidney failure [usually temporary] (1 in 500), bleeding (1 in 200), allergic reaction [possibly serious] (1 in 200).  Discussed with Dr. Kirke Corin and we will schedule her cardiac catheterization for tomorrow pending availability from the cath lab and review of her lab results. She will need to be NPO after midnight. Orders have been entered.  Signed, Ellsworth Lennox, PA-C 04/22/2015, 6:17 PM

## 2015-04-22 NOTE — Consult Note (Signed)
ANTICOAGULATION CONSULT NOTE - Initial Consult  Pharmacy Consult for Warfarin Indication: atrial fibrillation  No Known Allergies  Patient Measurements: Height:  (160 cm) Weight: 185 lb 12.8 oz (84.278 kg) IBW/kg (Calculated) : 52.4  Vital Signs: Temp: 98.2 F (36.8 C) (02/22 0546) Temp Source: Oral (02/22 0546) BP: 155/78 mmHg (02/22 0546) Pulse Rate: 48 (02/22 0546)  Labs:  Recent Labs  04/19/15 1932 04/20/15 0052 04/20/15 0558 04/21/15 0341 04/22/15 0708  HGB 14.1  --   --   --   --   HCT 42.6  --   --   --   --   PLT 277  --   --   --   --   LABPROT 33.0*  --   --  24.7* 20.5*  INR 3.32  --   --  2.26 1.76  CREATININE 0.64 0.77  --  0.88 0.85  TROPONINI 0.15* 1.05* 0.95*  --   --     Estimated Creatinine Clearance: 47.1 mL/min (by C-G formula based on Cr of 0.85).   Medical History: Past Medical History  Diagnosis Date  . HTN (hypertension)   . Hyperlipidemia   . PAF (paroxysmal atrial fibrillation) (HCC)     a. on Coumadin; b. CHADS2VASc = 5 (CHF, HTN, age x 2, female)  . Peripheral neuralgia   . Arthritis     Medications:  Scheduled:  . atorvastatin  20 mg Oral q1800  . digoxin  0.125 mg Oral Daily  . furosemide  20 mg Intravenous BID  . losartan  25 mg Oral Daily  . magnesium oxide  400 mg Oral BID  . metoprolol succinate  50 mg Oral Daily  . potassium chloride  40 mEq Oral BID  . sodium chloride flush  3 mL Intravenous Q12H  . Warfarin - Pharmacist Dosing Inpatient   Does not apply q1800    Assessment: DP is an 82 yo female admitted for increased SOB and LE edema 2/2 suspected CHF exacerbation. She is on warfarin for Afib. Pharmacy consulted to dose warfarin in this patient.  PTA patient on warfarin  daily.  2/21 INR: 2.26 - warfarin 1 mg given INR today of 1.76  Goal of Therapy:  INR 2-3 Monitor platelets by anticoagulation protocol: Yes   Plan:  INR on admission was supratherapeutic at 3.32. Will increase dose slightly to  1.5 mg po daily as INR decreased overnight.  Will recheck INR in AM.   Pharmacy will continue to follow.  Jalaysha Skilton G 04/22/2015,8:23 AM

## 2015-04-22 NOTE — Progress Notes (Signed)
[facility] - [place] at [facility]   PATIENT NAME: Kathy Lowery    MR#:  [id number]  DATE OF BIRTH:  04-Mar-1924  SUBJECTIVE:  CHIEF COMPLAINT:   Chief Complaint  Patient presents with  . Cough    seems very comfortable at this time, now have decided to go for cardiac catheterization after discussion with family.  REVIEW OF SYSTEMS:  ROS  Constitutional: Positive for malaise/fatigue. Negative for fever, chills and weight loss.  HENT: Negative for hearing loss and nosebleeds.  Eyes: Negative for blurred vision, double vision and pain.  Respiratory: Positive for cough and shortness of breath. Negative for hemoptysis, sputum production and wheezing.  Cardiovascular: Positive for orthopnea and leg swelling. Negative for chest pain and palpitations.  Gastrointestinal: Negative for nausea, vomiting, abdominal pain, diarrhea and constipation.  Genitourinary: Negative for dysuria and hematuria.  Musculoskeletal: Negative for myalgias, back pain and falls.  Skin: Negative for rash.  Neurological: Positive for weakness. Negative for dizziness, tremors, sensory change, speech change, focal weakness, seizures and headaches.  Endo/Heme/Allergies: Does not bruise/bleed easily.  Psychiatric/Behavioral: Negative for depression and memory loss. The patient is not nervous/anxious.   DRUG ALLERGIES:  No Known Allergies  VITALS:  Blood pressure 152/56, pulse 53, temperature 97.6 F (36.4 C), temperature source Oral, resp. rate 20, height  (1.6 m), weight 84.278 kg (185 lb 12.8 oz), SpO2 93 %.  PHYSICAL EXAMINATION:   GENERAL: 82 y.o.-year-old patient lying in the bed with no acute distress.  EYES: Pupils equal, round, reactive to light and accommodation. No scleral icterus. Extraocular muscles intact.  HEENT: Head atraumatic, normocephalic. Oropharynx and nasopharynx clear. No oropharyngeal erythema, moist oral mucosa  NECK: Supple, no jugular venous  distention. No thyroid enlargement, no tenderness.  LUNGS: Normal breath sounds bilaterally, no wheezing,some crepitations, no use of accessory respi muscles. CARDIOVASCULAR: S1, S2 normal. No murmurs, rubs, or gallops.  ABDOMEN: Soft, nontender, nondistended. Bowel sounds present. No organomegaly or mass.  EXTREMITIES: No cyanosis, or clubbing. + 2 pedal & radial pulses b/l.  NEUROLOGIC: Cranial nerves II through XII are intact. No focal Motor or sensory deficits appreciated b/l PSYCHIATRIC: The patient is alert and oriented x 3. Good affect.  SKIN: No obvious rash, lesion, or ulcer.  Physical Exam LABORATORY PANEL:   CBC  Recent Labs Lab 04/19/15 1932  WBC 9.6  HGB 14.1  HCT 42.6  PLT 277   ------------------------------------------------------------------------------------------------------------------  Chemistries   Recent Labs Lab 04/19/15 1932  04/21/15 0341  04/22/15 0708  NA 141  < > 139  --  141  K 3.3*  < > 2.9*  < > 3.7  CL 108  < > 105  --  106  CO2 22  < > 26  --  26  GLUCOSE 108*  < > 94  --  99  BUN 13  < > 19  --  26*  CREATININE 0.64  < > 0.88  --  0.85  CALCIUM 9.3  < > 9.0  --  8.8*  MG  --   < > 1.9  --   --   AST 17  --   --   --   --   ALT 10*  --   --   --   --   ALKPHOS 68  --   --   --   --   BILITOT 1.2  --   --   --   --   < > = values in  this interval not displayed. ------------------------------------------------------------------------------------------------------------------  Cardiac Enzymes  Recent Labs Lab 04/20/15 0052 04/20/15 0558  TROPONINI 1.05* 0.95*   ------------------------------------------------------------------------------------------------------------------  RADIOLOGY:  No results found.  ASSESSMENT AND PLAN:   Principal Problem:   Acute combined systolic and diastolic heart failure (HCC) Active Problems:   NSTEMI (non-ST elevated myocardial infarction) (HCC)   Persistent atrial fibrillation (HCC)    Current use of long term anticoagulation   Essential hypertension   Obesity (BMI 30-39.9)   Accelerated hypertension with diastolic congestive heart failure, NYHA class 3 (HCC)   Hypokalemia   * Acute on chronic congestive heart failure. Diastolic versus systolic. Ordered echocardiogram. - Echo showed EF of 30-35% -Notable regional wall motion abnormality suggesting anterior infarct -Continue IV Lasix, oral metoprolol and losartan - -5.3 L so far  * Non-STEMI: Patient agreeable for cardiac catheterization.  Cardiology made aware. - Stop Coumadin and started on heparin drip - .Start Aspirin, spironolactone  * Accelerated hypertension likely from CHF. Continue patient's metoprolol and Maxzide. Nitropaste applied which will help. - Continue losartan.  * Atrial fibrillation, rate controlled Continue digoxin. Metoprolol. - Hold warfarin and start heparin drip for cardiac catheterization  * DVT prophylaxis She is on warfarin which will be on hold for And will place on full dose heparin   All the records are reviewed and case discussed with Care Management/Social Worker. Management plans discussed with the patient, family and they are in agreement.  CODE STATUS: DO NOT RESUSCITATE  TOTAL TIME TAKING CARE OF THIS PATIENT: 35 minutes.    POSSIBLE D/C IN 2-3 DAYS, DEPENDING ON CLINICAL CONDITION.  And cardiac evaluation   [facility], Jasyah Theurer M.D on 04/22/2015   Between 7am to 6pm - Pager - [phone]  After 6pm go to [email or web address] - password [email or web address] [facility]  [place] [facility]  Office  [phone]  CC: Primary care physician; Danella Penton., MD  Note: This dictation was prepared with Dragon dictation along with smaller phrase technology. Any transcriptional errors that result from this process are unintentional.

## 2015-04-23 ENCOUNTER — Encounter
Admission: EM | Disposition: A | Discharge status: Home Health | Payer: Self-pay | Source: Home / Self Care | Attending: Internal Medicine

## 2015-04-23 DIAGNOSIS — I481 Persistent atrial fibrillation: Secondary | ICD-10-CM

## 2015-04-23 DIAGNOSIS — I2510 Atherosclerotic heart disease of native coronary artery without angina pectoris: Secondary | ICD-10-CM

## 2015-04-23 HISTORY — PX: CARDIAC CATHETERIZATION: SHX172

## 2015-04-23 LAB — HEPARIN LEVEL (UNFRACTIONATED): Heparin Unfractionated: 0.16 IU/mL — ABNORMAL LOW (ref 0.30–0.70)

## 2015-04-23 LAB — PROTIME-INR
INR: 1.54
Prothrombin Time: 18.5 seconds — ABNORMAL HIGH (ref 11.4–15.0)

## 2015-04-23 LAB — CBC
HEMATOCRIT: 38.4 % (ref 35.0–47.0)
HEMOGLOBIN: 13 g/dL (ref 12.0–16.0)
MCH: 30.9 pg (ref 26.0–34.0)
MCHC: 33.8 g/dL (ref 32.0–36.0)
MCV: 91.5 fL (ref 80.0–100.0)
Platelets: 246 10*3/uL (ref 150–440)
RBC: 4.2 MIL/uL (ref 3.80–5.20)
RDW: 15.9 % — AB (ref 11.5–14.5)
WBC: 9.3 10*3/uL (ref 3.6–11.0)

## 2015-04-23 LAB — GLUCOSE, CAPILLARY: Glucose-Capillary: 98 mg/dL (ref 65–99)

## 2015-04-23 SURGERY — LEFT HEART CATH AND CORONARY ANGIOGRAPHY
Anesthesia: Choice

## 2015-04-23 MED ORDER — HYDRALAZINE HCL 20 MG/ML IJ SOLN
INTRAMUSCULAR | Status: DC | PRN
  Administered 2015-04-23: 10 mg via INTRAVENOUS

## 2015-04-23 MED ORDER — METOPROLOL SUCCINATE ER 25 MG PO TB24
12.5000 mg | ORAL_TABLET | Freq: Every day | ORAL | Status: DC
  Administered 2015-04-23: 12.5 mg via ORAL
  Filled 2015-04-23: qty 1

## 2015-04-23 MED ORDER — HEPARIN SODIUM (PORCINE) 1000 UNIT/ML IJ SOLN
INTRAMUSCULAR | Status: AC
  Filled 2015-04-23: qty 1

## 2015-04-23 MED ORDER — SODIUM CHLORIDE 0.9 % IV SOLN: 250.0000 mL | INTRAVENOUS | Status: DC | PRN

## 2015-04-23 MED ORDER — SODIUM CHLORIDE 0.9% FLUSH: 3.0000 mL | INTRAVENOUS | Status: DC | PRN

## 2015-04-23 MED ORDER — WHITE PETROLATUM GEL
Status: AC
  Administered 2015-04-23: 14:00:00
  Filled 2015-04-23: qty 5

## 2015-04-23 MED ORDER — IOHEXOL 300 MG/ML  SOLN
INTRAMUSCULAR | Status: DC | PRN
  Administered 2015-04-23: 125 mL via INTRA_ARTERIAL

## 2015-04-23 MED ORDER — SODIUM CHLORIDE 0.9% FLUSH
3.0000 mL | Freq: Two times a day (BID) | INTRAVENOUS | Status: DC
  Administered 2015-04-23 – 2015-04-25 (×4): 3 mL via INTRAVENOUS

## 2015-04-23 MED ORDER — FENTANYL CITRATE (PF) 100 MCG/2ML IJ SOLN
INTRAMUSCULAR | Status: AC
  Administered 2015-04-23: 25 ug via INTRAVENOUS
  Filled 2015-04-23: qty 2

## 2015-04-23 MED ORDER — WARFARIN SODIUM 1 MG PO TABS
1.5000 mg | ORAL_TABLET | Freq: Every day | ORAL | Status: DC
  Administered 2015-04-23 – 2015-04-24 (×2): 1.5 mg via ORAL
  Filled 2015-04-23 (×3): qty 1

## 2015-04-23 MED ORDER — HEPARIN (PORCINE) IN NACL 2-0.9 UNIT/ML-% IJ SOLN
INTRAMUSCULAR | Status: AC
  Filled 2015-04-23: qty 500

## 2015-04-23 MED ORDER — FUROSEMIDE 40 MG PO TABS
40.0000 mg | ORAL_TABLET | Freq: Every day | ORAL | Status: DC
  Administered 2015-04-24 – 2015-04-25 (×2): 40 mg via ORAL
  Filled 2015-04-23 (×2): qty 1

## 2015-04-23 MED ORDER — FENTANYL CITRATE (PF) 100 MCG/2ML IJ SOLN
INTRAMUSCULAR | Status: DC | PRN
  Administered 2015-04-23 (×2): 25 ug via INTRAVENOUS

## 2015-04-23 MED ORDER — VERAPAMIL HCL 2.5 MG/ML IV SOLN
INTRAVENOUS | Status: DC | PRN
  Administered 2015-04-23: 2.5 mg via INTRAVENOUS

## 2015-04-23 MED ORDER — MIDAZOLAM HCL 2 MG/2ML IJ SOLN
INTRAMUSCULAR | Status: DC | PRN
  Administered 2015-04-23 (×2): 0.5 mg via INTRAVENOUS

## 2015-04-23 MED ORDER — FENTANYL CITRATE (PF) 100 MCG/2ML IJ SOLN
25.0000 ug | Freq: Once | INTRAMUSCULAR | Status: AC
  Administered 2015-04-23: 25 ug via INTRAVENOUS

## 2015-04-23 MED ORDER — FENTANYL CITRATE (PF) 100 MCG/2ML IJ SOLN
INTRAMUSCULAR | Status: AC
  Filled 2015-04-23: qty 2

## 2015-04-23 MED ORDER — VERAPAMIL HCL 2.5 MG/ML IV SOLN
INTRAVENOUS | Status: AC
  Filled 2015-04-23: qty 2

## 2015-04-23 MED ORDER — HYDRALAZINE HCL 20 MG/ML IJ SOLN
INTRAMUSCULAR | Status: AC
  Filled 2015-04-23: qty 1

## 2015-04-23 MED ORDER — SENNOSIDES-DOCUSATE SODIUM 8.6-50 MG PO TABS
2.0000 | ORAL_TABLET | Freq: Two times a day (BID) | ORAL | Status: DC
  Administered 2015-04-23 – 2015-04-25 (×4): 2 via ORAL
  Filled 2015-04-23 (×4): qty 2

## 2015-04-23 MED ORDER — WARFARIN - PHARMACIST DOSING INPATIENT
Freq: Every day | Status: DC
  Administered 2015-04-23 – 2015-04-24 (×2)

## 2015-04-23 MED ORDER — SODIUM CHLORIDE 0.9 % IV SOLN
INTRAVENOUS | Status: AC
  Administered 2015-04-23: 17:00:00 via INTRAVENOUS

## 2015-04-23 MED ORDER — MIDAZOLAM HCL 2 MG/2ML IJ SOLN
INTRAMUSCULAR | Status: AC
  Filled 2015-04-23: qty 2

## 2015-04-23 SURGICAL SUPPLY — 14 items
CATH AMP RT 5F (CATHETERS) ×3 IMPLANT
CATH INFINITI 5FR AL1 (CATHETERS) ×3 IMPLANT
CATH INFINITI 5FR JL4 (CATHETERS) ×3 IMPLANT
CATH INFINITI JR4 5F (CATHETERS) ×3 IMPLANT
CATH OPTITORQUE JACKY 4.0 5F (CATHETERS) ×3 IMPLANT
DEVICE CLOSURE MYNXGRIP 5F (Vascular Products) ×3 IMPLANT
DEVICE RAD TR BAND REGULAR (VASCULAR PRODUCTS) ×3 IMPLANT
GLIDESHEATH SLEND SS 6F .021 (SHEATH) ×3 IMPLANT
KIT MANI 3VAL PERCEP (MISCELLANEOUS) ×3 IMPLANT
NEEDLE PERC 18GX7CM (NEEDLE) ×3 IMPLANT
PACK CARDIAC CATH (CUSTOM PROCEDURE TRAY) ×3 IMPLANT
SHEATH AVANTI 5FR X 11CM (SHEATH) ×3 IMPLANT
WIRE HITORQ VERSACORE ST 145CM (WIRE) ×3 IMPLANT
WIRE SAFE-T 1.5MM-J .035X260CM (WIRE) ×3 IMPLANT

## 2015-04-23 NOTE — Consult Note (Signed)
ANTICOAGULATION CONSULT NOTE - Follow up Consult  Pharmacy Consult for Warfarin Indication: atrial fibrillation  No Known Allergies  Patient Measurements: Height:  (160 cm) Weight: 186 lb 12.8 oz (84.732 kg) IBW/kg (Calculated) : 52.4  Vital Signs: Temp: 97.8 F (36.6 C) (02/23 0446) Temp Source: Oral (02/23 0446) BP: 150/81 mmHg (02/23 1515) Pulse Rate: 61 (02/23 1515)  Labs:  Recent Labs  04/21/15 0341 04/22/15 0708 04/23/15 0130  HGB  --   --  13.0  HCT  --   --  38.4  PLT  --   --  246  LABPROT 24.7* 20.5* 18.5*  INR 2.26 1.76 1.54  HEPARINUNFRC  --   --  0.16*  CREATININE 0.88 0.85  --     Estimated Creatinine Clearance: 47.2 mL/min (by C-G formula based on Cr of 0.85).   Medical History: Past Medical History  Diagnosis Date  . HTN (hypertension)   . Hyperlipidemia   . PAF (paroxysmal atrial fibrillation) (HCC)     a. on Coumadin; b. CHADS2VASc = 5 (CHF, HTN, age x 2, female)  . Peripheral neuralgia   . Arthritis     Medications:  Scheduled:  . [MAR Hold] aspirin EC  81 mg Oral Daily  . [MAR Hold] atorvastatin  40 mg Oral q1800  . [MAR Hold] digoxin  0.125 mg Oral Daily  . [MAR Hold] losartan  25 mg Oral Daily  . [MAR Hold] magnesium oxide  400 mg Oral BID  . [MAR Hold] metoprolol succinate  12.5 mg Oral Daily  . [MAR Hold] sodium chloride flush  3 mL Intravenous Q12H  . sodium chloride flush  3 mL Intravenous Q12H  . sodium chloride flush  3 mL Intravenous Q12H  . [MAR Hold] spironolactone  12.5 mg Oral Daily  . warfarin  1.5 mg Oral q1800  . Warfarin - Pharmacist Dosing Inpatient   Does not apply q1800  . white petrolatum        Assessment: DP is an 82 yo female admitted for increased SOB and LE edema 2/2 suspected CHF exacerbation. She is on warfarin for Afib. Pharmacy consulted to dose warfarin in this patient.  PTA patient on warfarin  daily.  Upon admission patient's INR was supratherapeutic at 3.32.  2/22: INR: 1.76-warfarin  held for cardiac cath and heparin drip started 2/23: INR: 1.54   Goal of Therapy:  INR 2-3 Monitor platelets by anticoagulation protocol: Yes   Plan:  Per MD Sherryll Burger, restart patient on warfarin today.  Will order warfarin 1.5 mg po daily as INR supratherapeutic on home dosing of 2 mg daily.  Will check INR in AM.   Pharmacy will continue to follow.  Yaquelin Langelier G 04/23/2015,3:36 PM

## 2015-04-23 NOTE — Progress Notes (Signed)
[facility] - [place] at [facility]   PATIENT NAME: Kathy Lowery    MR#:  [id number]  DATE OF BIRTH:  1924-11-19  SUBJECTIVE:  CHIEF COMPLAINT:   Chief Complaint  Patient presents with  . Cough  seen in specials s/p cath - loopy.  REVIEW OF SYSTEMS:  ROS  Constitutional: Positive for malaise/fatigue. Negative for fever, chills and weight loss.  HENT: Negative for hearing loss and nosebleeds.  Eyes: Negative for blurred vision, double vision and pain.  Respiratory: Positive for cough and shortness of breath. Negative for hemoptysis, sputum production and wheezing.  Cardiovascular: Positive for orthopnea and leg swelling. Negative for chest pain and palpitations.  Gastrointestinal: Negative for nausea, vomiting, abdominal pain, diarrhea and constipation.  Genitourinary: Negative for dysuria and hematuria.  Musculoskeletal: Negative for myalgias, back pain and falls.  Skin: Negative for rash.  Neurological: Positive for weakness. Negative for dizziness, tremors, sensory change, speech change, focal weakness, seizures and headaches.  Endo/Heme/Allergies: Does not bruise/bleed easily.  Psychiatric/Behavioral: Negative for depression and memory loss. The patient is not nervous/anxious.   DRUG ALLERGIES:  No Known Allergies  VITALS:  Blood pressure 156/77, pulse 65, temperature 97.8 F (36.6 C), temperature source Oral, resp. rate 22, height  (1.6 m), weight 84.732 kg (186 lb 12.8 oz), SpO2 91 %.  PHYSICAL EXAMINATION:   GENERAL: 82 y.o.-year-old patient lying in the bed with no acute distress.  EYES: Pupils equal, round, reactive to light and accommodation. No scleral icterus. Extraocular muscles intact.  HEENT: Head atraumatic, normocephalic. Oropharynx and nasopharynx clear. No oropharyngeal erythema, moist oral mucosa  NECK: Supple, no jugular venous distention. No thyroid enlargement, no tenderness.  LUNGS: Normal breath sounds  bilaterally, no wheezing,some crepitations, no use of accessory respi muscles. CARDIOVASCULAR: S1, S2 normal. No murmurs, rubs, or gallops.  ABDOMEN: Soft, nontender, nondistended. Bowel sounds present. No organomegaly or mass.  EXTREMITIES: No cyanosis, or clubbing. + 2 pedal & radial pulses b/l.  NEUROLOGIC: Cranial nerves II through XII are intact. No focal Motor or sensory deficits appreciated b/l PSYCHIATRIC: The patient is alert and oriented x 3. Good affect.  SKIN: No obvious rash, lesion, or ulcer.  Physical Exam LABORATORY PANEL:   CBC  Recent Labs Lab 04/23/15 0130  WBC 9.3  HGB 13.0  HCT 38.4  PLT 246   ------------------------------------------------------------------------------------------------------------------  Chemistries   Recent Labs Lab 04/19/15 1932  04/21/15 0341  04/22/15 0708  NA 141  < > 139  --  141  K 3.3*  < > 2.9*  < > 3.7  CL 108  < > 105  --  106  CO2 22  < > 26  --  26  GLUCOSE 108*  < > 94  --  99  BUN 13  < > 19  --  26*  CREATININE 0.64  < > 0.88  --  0.85  CALCIUM 9.3  < > 9.0  --  8.8*  MG  --   < > 1.9  --   --   AST 17  --   --   --   --   ALT 10*  --   --   --   --   ALKPHOS 68  --   --   --   --   BILITOT 1.2  --   --   --   --   < > = values in this interval not displayed. ------------------------------------------------------------------------------------------------------------------  Cardiac Enzymes  Recent Labs Lab 04/20/15  4403 04/20/15 0558  TROPONINI 1.05* 0.95*   ------------------------------------------------------------------------------------------------------------------  RADIOLOGY:  No results found.  ASSESSMENT AND PLAN:   Principal Problem:   Acute combined systolic and diastolic heart failure (HCC) Active Problems:   NSTEMI (non-ST elevated myocardial infarction) (HCC)   Persistent atrial fibrillation (HCC)   Current use of long term anticoagulation   Essential hypertension   Obesity  (BMI 30-39.9)   Accelerated hypertension with diastolic congestive heart failure, NYHA class 3 (HCC)   Hypokalemia   * Acute on chronic congestive heart failure. Diastolic versus systolic. Ordered echocardiogram. - Echo showed EF of 30-35% -Notable regional wall motion abnormality suggesting anterior infarct -Continue IV Lasix, oral metoprolol and losartan - -6.4 L so far  * Non-STEMI: s/p cardiac catheterization - stress induced cardiomyopathy per cardio. - Stop heparin drip ad resume coumadin - continue Aspirin, spironolactone  * Accelerated hypertension likely from CHF. Continue patient's metoprolol and Maxzide. Nitropaste applied which will help. - Continue losartan.  * Atrial fibrillation, rate controlled Continue digoxin. Metoprolol. - resume coumadin  * DVT prophylaxis She is on warfarin  PT eval    All the records are reviewed and case discussed with Care Management/Social Worker. Management plans discussed with the patient, family and they are in agreement.  CODE STATUS: DO NOT RESUSCITATE  TOTAL TIME TAKING CARE OF THIS PATIENT: 35 minutes.    POSSIBLE D/C IN AM, DEPENDING ON CLINICAL CONDITION   [facility], Elliott Lasecki M.D on 04/23/2015   Between 7am to 6pm - Pager - [phone]  After 6pm go to [email or web address] - password [email or web address] [facility]  [place] [facility]  Office  [phone]  CC: Primary care physician; Danella Penton., MD  Note: This dictation was prepared with Dragon dictation along with smaller phrase technology. Any transcriptional errors that result from this process are unintentional.

## 2015-04-23 NOTE — Progress Notes (Signed)
ANTICOAGULATION CONSULT NOTE - Follow Up Consult  Pharmacy Consult for Heparin  Indication:Atrial fibrillation   No Known Allergies  Patient Measurements: Height:  (160 cm) Weight: 186 lb 12.8 oz (84.732 kg) IBW/kg (Calculated) : 52.4 Heparin Dosing Weight: 70.3 kg   Vital Signs: Temp: 97.8 F (36.6 C) (02/23 0446) Temp Source: Oral (02/23 0446) BP: 156/60 mmHg (02/23 0446) Pulse Rate: 48 (02/23 0446)  Labs:  Recent Labs  04/21/15 0341 04/22/15 0708 04/23/15 0130  HGB  --   --  13.0  HCT  --   --  38.4  PLT  --   --  246  LABPROT 24.7* 20.5* 18.5*  INR 2.26 1.76 1.54  HEPARINUNFRC  --   --  0.16*  CREATININE 0.88 0.85  --     Estimated Creatinine Clearance: 47.2 mL/min (by C-G formula based on Cr of 0.85).   Medical History: Past Medical History  Diagnosis Date  . HTN (hypertension)   . Hyperlipidemia   . PAF (paroxysmal atrial fibrillation) (HCC)     a. on Coumadin; b. CHADS2VASc = 5 (CHF, HTN, age x 2, female)  . Peripheral neuralgia   . Arthritis     Medications:  Prescriptions prior to admission  Medication Sig Dispense Refill Last Dose  . digoxin (LANOXIN) 0.125 MG tablet Take 0.125 mg by mouth daily.     Marland Kitchen gemfibrozil (LOPID) 600 MG tablet Take 600 mg by mouth 2 (two) times daily before a meal.     . metoprolol succinate (TOPROL-XL) 50 MG 24 hr tablet Take 50 mg by mouth daily. Take with or immediately following a meal.     . triamterene-hydrochlorothiazide (MAXZIDE-25) 37.5-25 MG tablet Take 1 tablet by mouth daily.     Marland Kitchen warfarin (COUMADIN) 2 MG tablet Take 2 mg by mouth daily.       Assessment: Pharmacy consulted to dose heparin in this 82 year old female with Afib, previously treated with warfarin.  Pt scheduled for cardiac cath.  INR on 2/22 = 1.76 .    HL at 0130 of 0.16-no adjustment made to heparin drip  Goal of Therapy:  Heparin level 0.3-0.7 units/ml Monitor platelets by anticoagulation protocol: Yes    Plan:  Will order Stat  HL for now as it has been ~8 hours since last level.  Will adjust from this level.  Clarisa Schools, PharmD Clinical Pharmacist 04/23/2015

## 2015-04-23 NOTE — Care Management Important Message (Signed)
Important Message  Patient Details  Name: Kathy Lowery MRN: [id number] Date of Birth: 1924-03-01   Medicare Important Message Given:  Yes    Olegario Messier A Kasiyah Platter 04/23/2015, 12:03 PM

## 2015-04-23 NOTE — Progress Notes (Signed)
Patient: Kathy Lowery / Admit Date: 04/19/2015 / Date of Encounter: 04/23/2015, 8:15 AM   Subjective: She is for cardiac catheterization today. Risks and benefits have been explained, she understands and agrees to proceed. No acute events overnight. INR 1.54. She is on IV heparin gtt.   Review of Systems: Review of Systems  Constitutional: Positive for weight loss and malaise/fatigue. Negative for fever, chills and diaphoresis.  HENT: Negative for congestion.   Eyes: Negative for discharge and redness.  Respiratory: Negative for cough, hemoptysis, sputum production, shortness of breath and wheezing.   Cardiovascular: Negative for chest pain, palpitations, orthopnea, claudication, leg swelling and PND.  Gastrointestinal: Negative for nausea, vomiting and abdominal pain.  Musculoskeletal: Negative for myalgias and falls.  Skin: Negative for rash.  Neurological: Positive for weakness. Negative for dizziness, tingling, tremors, sensory change, speech change, focal weakness and loss of consciousness.  Endo/Heme/Allergies: Does not bruise/bleed easily.  Psychiatric/Behavioral: The patient is not nervous/anxious.      Objective: Telemetry: Afib with bradycardic rate in the 40's to 50's Physical Exam: Blood pressure 156/60, pulse 48, temperature 97.8 F (36.6 C), temperature source Oral, resp. rate 16, height  (1.6 m), weight 186 lb 12.8 oz (84.732 kg), SpO2 93 %. Body mass index is 33.1 kg/(m^2). General: Well developed, well nourished, in no acute distress. Head: Normocephalic, atraumatic, sclera non-icteric, no xanthomas, nares are without discharge. Neck: Negative for carotid bruits. JVP not elevated. Lungs: Clear bilaterally to auscultation without wheezes, rales, or rhonchi. Breathing is unlabored. Heart: Irregularly-irregular, bradycardic, S1 S2 without murmurs, rubs, or gallops.  Abdomen: Obese, soft, non-tender, non-distended with normoactive bowel sounds. No  rebound/guarding. Extremities: No clubbing or cyanosis. No edema. Distal pedal pulses are 2+ and equal bilaterally. Neuro: Alert and oriented X 3. Moves all extremities spontaneously. Psych:  Responds to questions appropriately with a normal affect.   Intake/Output Summary (Last 24 hours) at 04/23/15 0815 Last data filed at 04/23/15 0447  Gross per 24 hour  Intake    720 ml  Output   1500 ml  Net   -780 ml    Inpatient Medications:  . aspirin EC  81 mg Oral Daily  . atorvastatin  40 mg Oral q1800  . digoxin  0.125 mg Oral Daily  . furosemide  20 mg Intravenous BID  . losartan  25 mg Oral Daily  . magnesium oxide  400 mg Oral BID  . metoprolol succinate  12.5 mg Oral Daily  . sodium chloride flush  3 mL Intravenous Q12H  . sodium chloride flush  3 mL Intravenous Q12H  . spironolactone  12.5 mg Oral Daily   Infusions:  . sodium chloride 10 mL/hr at 04/23/15 0539  . heparin 850 Units/hr (04/22/15 1724)    Labs:  Recent Labs  04/21/15 0341 04/21/15 1126 04/22/15 0708  NA 139  --  141  K 2.9* 3.4* 3.7  CL 105  --  106  CO2 26  --  26  GLUCOSE 94  --  99  BUN 19  --  26*  CREATININE 0.88  --  0.85  CALCIUM 9.0  --  8.8*  MG 1.9  --   --    No results for input(s): AST, ALT, ALKPHOS, BILITOT, PROT, ALBUMIN in the last 72 hours.  Recent Labs  04/23/15 0130  WBC 9.3  HGB 13.0  HCT 38.4  MCV 91.5  PLT 246   No results for input(s): CKTOTAL, CKMB, TROPONINI in the last 72 hours. Invalid  input(s): POCBNP No results for input(s): HGBA1C in the last 72 hours.   Weights: Filed Weights   04/21/15 0523 04/22/15 0546 04/23/15 0446  Weight: 188 lb 14.4 oz (85.684 kg) 185 lb 12.8 oz (84.278 kg) 186 lb 12.8 oz (84.732 kg)     Radiology/Studies:  Dg Chest 2 View  04/19/2015  CLINICAL DATA:  Dry cough and sinus pressure as well as weakness. EXAM: CHEST  2 VIEW COMPARISON:  None. FINDINGS: Lungs are adequately inflated and demonstrate small bilateral pleural effusions  likely with associated bibasilar atelectasis. There is mild prominence of the perihilar markings suggesting mild degree of vascular congestion. Mild to moderate cardiomegaly. Calcified plaque is present over the thoracoabdominal aorta. There are mild degenerate changes of the spine. Minimal anterior wedging of a vertebrae at the thoracolumbar junction likely chronic. IMPRESSION: Moderate cardiomegaly with mild vascular congestion and small bilateral pleural effusions likely with associated bibasilar atelectasis. Electronically Signed   By: Elberta Fortis M.D.   On: 04/19/2015 17:28     Assessment and Plan   1. Acute on chronic combined CHF: -Echo with EF of 30-35%, anterior and anteroseptal wall HK -She has diuresed 6100 mL so far -IV Lasix held this morning in preparation for cardiac catheterization  -Toprol-XL decreased to 12.5 mg daily given her bradycardic rates -Continue losartan 25 mg daily -Can add spironolactone at discharge  2. NSTEMI:  -Peak of 1.05, asymptomatic -She has spoken with her daughter and would like for cardiac cath at this time. Her Coumadin has been held and she has an INR of 1.5 at this time and was started on heparin gtt on 2/22. She is for cardiac cath 2/23. Risks and benefits of cardiac catheterization have been discussed with the patient including risks of bleeding, bruising, infection, kidney damage, stroke, heart attack, and death. The patient understands these risks and is willing to proceed with the procedure. All questions have been answered and concerns listened to.  -No prior ischemic evaluation -Echo as above with notable regional WMA - suggesting Anterior Infarct (however this reduction in EF is potentially not acute given minimal troponin elevation. -She has received full-dose aspirin x 1 upon admission, not on aspirin 81 mg as she is on Coumadin, but with likely CAD/MI would add for short term  3. Persistent Afib: -Bradycardic rates this morning with heart  rates in the 40's to 50's -Decrease Toprol-XL to 12.5 mg daily, consider holding digoxin if needed -Likely in the setting of her volume overload -She remains on Coumadin -Currently INR < 2 -- Warfarin ordered; pending decision re: Cath, will need INR<1.8. -Continue Toprol-XL 50 mg daily and digoxin 0.125 mg daily -Check a digoxin level -CHADS2VASc at least 5 (CHF, HTN, age x 2, female)  4. Accelerated HTN: -Continue current medications  5. Hypokalemia: -Continue to replete to 4.0  6. Hypomagnesemia: -Replete to 2.0  7. Obesity: -Possible sleep apnea. She would benefit from an outpatient sleep study. -These are likely also driving her PAF  Elinor Dodge, PA-C Pager: [phone] 04/23/2015, 8:15 AM

## 2015-04-24 ENCOUNTER — Encounter: Payer: Self-pay | Admitting: Cardiovascular Disease

## 2015-04-24 LAB — PROTIME-INR
INR: 1.47
Prothrombin Time: 17.9 seconds — ABNORMAL HIGH (ref 11.4–15.0)

## 2015-04-24 MED ORDER — METOPROLOL SUCCINATE ER 25 MG PO TB24
12.5000 mg | ORAL_TABLET | Freq: Every day | ORAL | Status: DC
  Administered 2015-04-24 – 2015-04-25 (×2): 12.5 mg via ORAL
  Filled 2015-04-24 (×2): qty 1

## 2015-04-24 NOTE — Progress Notes (Signed)
[facility] [place] at [facility]   PATIENT NAME: Kathy Lowery    MR#:  [id number]  DATE OF BIRTH:  12-Aug-1924  SUBJECTIVE:  CHIEF COMPLAINT:   Chief Complaint  Patient presents with  . Cough   heart rate dropped in 30s, weak,  REVIEW OF SYSTEMS:  Review of Systems  Constitutional: Positive for malaise/fatigue. Negative for fever, weight loss and diaphoresis.  HENT: Negative for ear discharge, ear pain, hearing loss, nosebleeds, sore throat and tinnitus.   Eyes: Negative for blurred vision and pain.  Respiratory: Positive for cough. Negative for hemoptysis, shortness of breath and wheezing.   Cardiovascular: Negative for chest pain, palpitations, orthopnea and leg swelling.  Gastrointestinal: Negative for heartburn, nausea, vomiting, abdominal pain, diarrhea, constipation and blood in stool.  Genitourinary: Negative for dysuria, urgency and frequency.  Musculoskeletal: Negative for myalgias and back pain.  Skin: Negative for itching and rash.  Neurological: Negative for dizziness, tingling, tremors, focal weakness, seizures, weakness and headaches.  Psychiatric/Behavioral: Negative for depression. The patient is not nervous/anxious.     DRUG ALLERGIES:  No Known Allergies  VITALS:  Blood pressure 135/49, pulse 55, temperature 97.8 F (36.6 C), temperature source Oral, resp. rate 20, height  (1.6 m), weight 84.687 kg (186 lb 11.2 oz), SpO2 91 %.  PHYSICAL EXAMINATION:   GENERAL: 82 y.o.-year-old patient lying in the bed with no acute distress.  EYES: Pupils equal, round, reactive to light and accommodation. No scleral icterus. Extraocular muscles intact.  HEENT: Head atraumatic, normocephalic. Oropharynx and nasopharynx clear. No oropharyngeal erythema, moist oral mucosa  NECK: Supple, no jugular venous distention. No thyroid enlargement, no tenderness.  LUNGS: Normal breath sounds bilaterally, no wheezing,some crepitations, no use of  accessory respi muscles. CARDIOVASCULAR: Irregularly irregular heart rate. No murmurs, rubs, or gallops.  ABDOMEN: Soft, nontender, nondistended. Bowel sounds present. No organomegaly or mass.  EXTREMITIES: No cyanosis, or clubbing. + 2 pedal & radial pulses b/l.  NEUROLOGIC: Cranial nerves II through XII are intact. No focal Motor or sensory deficits appreciated b/l PSYCHIATRIC: The patient is alert and oriented x 3. Good affect.  SKIN: No obvious rash, lesion, or ulcer.  Physical Exam LABORATORY PANEL:   CBC  Recent Labs Lab 04/23/15 0130  WBC 9.3  HGB 13.0  HCT 38.4  PLT 246   ------------------------------------------------------------------------------------------------------------------  Chemistries   Recent Labs Lab 04/19/15 1932  04/21/15 0341  04/22/15 0708  NA 141  < > 139  --  141  K 3.3*  < > 2.9*  < > 3.7  CL 108  < > 105  --  106  CO2 22  < > 26  --  26  GLUCOSE 108*  < > 94  --  99  BUN 13  < > 19  --  26*  CREATININE 0.64  < > 0.88  --  0.85  CALCIUM 9.3  < > 9.0  --  8.8*  MG  --   < > 1.9  --   --   AST 17  --   --   --   --   ALT 10*  --   --   --   --   ALKPHOS 68  --   --   --   --   BILITOT 1.2  --   --   --   --   < > = values in this interval not displayed. ------------------------------------------------------------------------------------------------------------------  Cardiac Enzymes  Recent Labs Lab 04/20/15 0052 04/20/15  0558  TROPONINI 1.05* 0.95*   ------------------------------------------------------------------------------------------------------------------  RADIOLOGY:  No results found.  ASSESSMENT AND PLAN:   Principal Problem:   Acute combined systolic and diastolic heart failure (HCC) Active Problems:   NSTEMI (non-ST elevated myocardial infarction) (HCC)   Persistent atrial fibrillation (HCC)   Current use of long term anticoagulation   Essential hypertension   Obesity (BMI 30-39.9)   Accelerated  hypertension with diastolic congestive heart failure, NYHA class 3 (HCC)   Hypokalemia   * Acute on chronic systolic congestive heart failure.  - Echo showed EF of 30-35% -Notable regional wall motion abnormality suggesting anterior infarct -Continue IV Lasix, oral metoprolol and losartan - -6.9 L so far  * Non-STEMI: s/p cardiac catheterization - significant two-vessel coronary artery disease.  No obstructive disease in LAD, EF of 30-35% with wall motion abnormality suggestive of stress-induced myocardial myopathy.   - continue coumadin - continue Aspirin, spironolactone - Her heart rate dropped last night, so digoxin and metoprolol were held  * Accelerated hypertension likely from CHF. Continue patient's metoprolol and Maxzide. Nitropaste applied which will help. - Continue losartan.  * Atrial fibrillation, rate controlled Held digoxin. Metoprolol due to Bradycardia last night - Continue coumadin  * DVT prophylaxis She is on warfarin  PT eval - went home health physical therapy, which patient initially refused, but family really wants her to have that.  We will set up   All the records are reviewed and case discussed with Care Management/Social Worker. Management plans discussed with the patient, family and they are in agreement.  CODE STATUS: DO NOT RESUSCITATE  TOTAL TIME TAKING CARE OF THIS PATIENT: 35 minutes.    POSSIBLE D/C TOMORROW, DEPENDING ON CLINICAL CONDITION.  Monitor her heart rate overnight   [facility], Raidyn Breiner M.D on 04/24/2015   Between 7am to 6pm - Pager - [phone]  After 6pm go to [email or web address] - password [email or web address] [email or web address]  [facility]  Office  [phone]  CC: Primary care physician; Danella Penton., MD  Note: This dictation was prepared with Dragon dictation along with smaller phrase technology. Any transcriptional errors that result from this process are unintentional.

## 2015-04-24 NOTE — Progress Notes (Signed)
RN notified of pt's heart rate dropping into the 30's. MD notified. Order given to discontinue daily Metoprolol. Will continue to monitor.   Kathy Lowery

## 2015-04-24 NOTE — Evaluation (Signed)
Physical Therapy Evaluation Patient Details Name: Kathy Lowery MRN: [id number] DOB: 03/28/1924 Today's Date: 04/24/2015   History of Present Illness  Pt admitted for acute systolic/diastolic heart failure. Pt with complaints of cough, increased B LE swelling and SOB. Pt with history of Afib and HTN. Pt is now s/p cardiac cath on 04/23/15.   Clinical Impression  Pt is a pleasant 82 year old female who was admitted for acute systolic/diastolic heart failure. Pt is now s/p cardiac cath on 04/23/15. Needs are planned to be met through medical management for CAD and cardiomyopathy. Pt performs bed mobility/transfers with mod I and ambulation with cga and rw. Safe technique performed with all mobility. Pt demonstrates deficits with strength in B LE/endurance. Would benefit from skilled PT to address above deficits and promote optimal return to PLOF. Pt reports she is at baseline level at this time. Would benefit from continued HHPT for further strengthening/endurance training, however pt refuses at this time.      Follow Up Recommendations Home health PT (pt refusing at this time)    Equipment Recommendations  None recommended by PT    Recommendations for Other Services       Precautions / Restrictions Precautions Precautions: Fall Restrictions Weight Bearing Restrictions: No      Mobility  Bed Mobility Overal bed mobility: Modified Independent             General bed mobility comments: safe technique performed  Transfers Overall transfer level: Modified independent Equipment used: Rolling walker (2 wheeled)             General transfer comment: safe technique with correct hand placement noted. Once standing, no unsteadiness noted. Safe technique  Ambulation/Gait Ambulation/Gait assistance: Min guard Ambulation Distance (Feet): 70 Feet Assistive device: Rolling walker (2 wheeled) Gait Pattern/deviations: Step-through pattern     General Gait Details: ambulated laps in  room demonstrating reciprocal gait pattern. Pt able to navigate/turn RW for obstacle avoidance and change gait speeds on command. Pt with no SOB symptoms, however slight dizziness noted, limiting further distance. At end of ambulation, HR: 57 and O2 sats at 91% on RA.  Stairs            Wheelchair Mobility    Modified Rankin (Stroke Patients Only)       Balance Overall balance assessment: Modified Independent                                           Pertinent Vitals/Pain Pain Assessment: Faces Faces Pain Scale: Hurts a little bit Pain Location: R rib cage with movement Pain Descriptors / Indicators: Dull;Discomfort Pain Intervention(s): Limited activity within patient's tolerance    Home Living Family/patient expects to be discharged to:: Private residence Living Arrangements: Alone Available Help at Discharge:  (can call RN if needed) Type of Home: Independent living facility Home Access: Level entry     Home Layout: One level Home Equipment: Walker - 2 wheels;Walker - 4 wheels      Prior Function Level of Independence: Independent with assistive device(s)         Comments: ambulates with RW at all time     Hand Dominance        Extremity/Trunk Assessment   Upper Extremity Assessment: Overall WFL for tasks assessed           Lower Extremity Assessment: Generalized weakness (grossly 4+/5)  Communication   Communication: No difficulties  Cognition Arousal/Alertness: Awake/alert Behavior During Therapy: WFL for tasks assessed/performed Overall Cognitive Status: Within Functional Limits for tasks assessed                      General Comments      Exercises Other Exercises Other Exercises: Pt ambulated to bathroom with cga and able to perform self hygiene with safe technique and supervision for set up. Pt then able to ambulate to sink near door to room for hand cleaning with safe technique including weight  shifting in anterior direction for washing.      Assessment/Plan    PT Assessment Patient needs continued PT services  PT Diagnosis Generalized weakness   PT Problem List Decreased strength;Decreased mobility  PT Treatment Interventions Gait training;Therapeutic exercise   PT Goals (Current goals can be found in the Care Plan section) Acute Rehab PT Goals Patient Stated Goal: to go back to ILF PT Goal Formulation: With patient Time For Goal Achievement: 05/08/15 Potential to Achieve Goals: Good    Frequency Min 2X/week   Barriers to discharge        Co-evaluation               End of Session Equipment Utilized During Treatment: Gait belt Activity Tolerance: Patient tolerated treatment well Patient left: in chair;with chair alarm set Nurse Communication: Mobility status         Time: 1219-7588 PT Time Calculation (min) (ACUTE ONLY): 25 min   Charges:   PT Evaluation $PT Eval Moderate Complexity: 1 Procedure PT Treatments $Therapeutic Activity: 8-22 mins   PT G Codes:        Annakate Soulier 2015/05/18, 10:02 AM Greggory Stallion, PT, DPT [phone]

## 2015-04-24 NOTE — Care Management (Signed)
No discharge needs per care team

## 2015-04-24 NOTE — Consult Note (Signed)
ANTICOAGULATION CONSULT NOTE - Follow up Consult  Pharmacy Consult for Warfarin Indication: atrial fibrillation  No Known Allergies  Patient Measurements: Height:  (160 cm) Weight: 186 lb 11.2 oz (84.687 kg) IBW/kg (Calculated) : 52.4  Vital Signs: Temp: 97.8 F (36.6 C) (02/24 1104) Temp Source: Oral (02/24 1104) BP: 135/49 mmHg (02/24 1104) Pulse Rate: 55 (02/24 1104)  Labs:  Recent Labs  04/22/15 0708 04/23/15 0130 04/24/15 0456  HGB  --  13.0  --   HCT  --  38.4  --   PLT  --  246  --   LABPROT 20.5* 18.5* 17.9*  INR 1.76 1.54 1.47  HEPARINUNFRC  --  0.16*  --   CREATININE 0.85  --   --     Estimated Creatinine Clearance: 47.2 mL/min (by C-G formula based on Cr of 0.85).   Medical History: Past Medical History  Diagnosis Date  . HTN (hypertension)   . Hyperlipidemia   . PAF (paroxysmal atrial fibrillation) (HCC)     a. on Coumadin; b. CHADS2VASc = 5 (CHF, HTN, age x 2, female)  . Peripheral neuralgia   . Arthritis     Medications:  Scheduled:  . aspirin EC  81 mg Oral Daily  . atorvastatin  40 mg Oral q1800  . furosemide  40 mg Oral Daily  . losartan  25 mg Oral Daily  . magnesium oxide  400 mg Oral BID  . senna-docusate  2 tablet Oral BID  . sodium chloride flush  3 mL Intravenous Q12H  . sodium chloride flush  3 mL Intravenous Q12H  . spironolactone  12.5 mg Oral Daily  . warfarin  1.5 mg Oral q1800  . Warfarin - Pharmacist Dosing Inpatient   Does not apply q1800    Assessment: DP is an 82 yo female admitted for increased SOB and LE edema 2/2 suspected CHF exacerbation. She is on warfarin for Afib. Pharmacy consulted to dose warfarin in this patient.  PTA patient on warfarin  daily.  Upon admission patient's INR was supratherapeutic at 3.32.  2/22: INR: 1.76-warfarin held for cardiac cath and heparin drip started  2/23: INR: 1.54- warfarin 1.5 mg  2/24: INR: 1.47   Goal of Therapy:  INR 2-3 Monitor platelets by  anticoagulation protocol: Yes   Plan:  Continue current orders for warfarin 1.5 mg po daily.  Patient resumed therapy on 2/23 therefore may take 3-5 days to see full effects on INR.  Will follow with INR in AM.  Pharmacy will continue to follow.  Chyane Greer G 04/24/2015,3:08 PM

## 2015-04-24 NOTE — Progress Notes (Signed)
Pt daughter, Kerrie Buffalo updated about pts status, also given information about discharge process. Mayra Neer M

## 2015-04-24 NOTE — Care Management (Signed)
Discharge was delayed for continued observation of cardiac status due to bradycardia and recent medication changes. Patient had declined home health physical therapy earlier today but after speaking with her daughter she is now in agreement.  Says that she had home health in 2006 after hip replacement but she does not remember the name of the agency.  She was discharged from [facility] to a skilled facility at that time.  Provided patient with list of agencies and marked the agencies that would most likely be in her insurance network.   She will discuss it with her daughter and make a choice.  Will have weekend CM follow up.  Anticipate discharge 2/25.  will need SN to monitor cp status and physical therapy

## 2015-04-24 NOTE — Progress Notes (Signed)
Patient Name: Kathy Lowery Date of Encounter: 04/24/2015  Hospital Problem List     Principal Problem:   Acute combined systolic and diastolic heart failure [facility]) Active Problems:   NSTEMI (non-ST elevated myocardial infarction) (HCC)   Persistent atrial fibrillation (HCC)   Essential hypertension   Obesity (BMI 30-39.9)   Accelerated hypertension with diastolic congestive heart failure, NYHA class 3 (HCC)   Current use of long term anticoagulation   Hypokalemia    Subjective   Feels well.  Good spirits.  No c/p, sob, or groin/cath site complaints.  Inpatient Medications    . aspirin EC  81 mg Oral Daily  . atorvastatin  40 mg Oral q1800  . furosemide  40 mg Oral Daily  . losartan  25 mg Oral Daily  . magnesium oxide  400 mg Oral BID  . senna-docusate  2 tablet Oral BID  . sodium chloride flush  3 mL Intravenous Q12H  . sodium chloride flush  3 mL Intravenous Q12H  . spironolactone  12.5 mg Oral Daily  . warfarin  1.5 mg Oral q1800  . Warfarin - Pharmacist Dosing Inpatient   Does not apply q1800    Vital Signs    Filed Vitals:   04/23/15 2041 04/24/15 0320 04/24/15 0852 04/24/15 1104  BP: 145/72 162/57 159/54 135/49  Pulse: 61 48 70 55  Temp: 98.1 F (36.7 C) 97.5 F (36.4 C) 97.7 F (36.5 C) 97.8 F (36.6 C)  TempSrc: Oral Oral Oral Oral  Resp: Height:      Weight:  186 lb 11.2 oz (84.687 kg)    SpO2: 94% 94% 93% 91%    Intake/Output Summary (Last 24 hours) at 04/24/15 1436 Last data filed at 04/24/15 1200  Gross per 24 hour  Intake    435 ml  Output    700 ml  Net   -265 ml   Filed Weights   04/22/15 0546 04/23/15 0446 04/24/15 0320  Weight: 185 lb 12.8 oz (84.278 kg) 186 lb 12.8 oz (84.732 kg) 186 lb 11.2 oz (84.687 kg)    Physical Exam    General: Pleasant, NAD. Neuro: Alert and oriented X 3. Moves all extremities spontaneously. Psych: Normal affect. HEENT:  Normal  Neck: Supple without bruits or JVD. Lungs:  Resp regular and  unlabored, CTA. Heart: IR, IR, brady, no s3, s4, or murmurs. Abdomen: Soft, non-tender, non-distended, BS + x 4.  Extremities: No clubbing, cyanosis or edema. DP/PT/Radials 2+ and equal bilaterally. R groin site w/o bleeding/bruit/hematoma.  Labs    CBC  Recent Labs  04/23/15 0130  WBC 9.3  HGB 13.0  HCT 38.4  MCV 91.5  PLT 246   Basic Metabolic Panel  Recent Labs  04/22/15 0708  NA 141  K 3.7  CL 106  CO2 26  GLUCOSE 99  BUN 26*  CREATININE 0.85  CALCIUM 8.8*   Lab Results  Component Value Date   INR 1.47 04/24/2015   INR 1.54 04/23/2015   INR 1.76 04/22/2015    Telemetry    AF 40-60.  Radiology    Dg Chest 2 View  04/19/2015  CLINICAL DATA:  Dry cough and sinus pressure as well as weakness. EXAM: CHEST  2 VIEW COMPARISON:  None. FINDINGS: Lungs are adequately inflated and demonstrate small bilateral pleural effusions likely with associated bibasilar atelectasis. There is mild prominence of the perihilar markings suggesting mild degree of vascular congestion. Mild to moderate cardiomegaly. Calcified plaque is present  over the thoracoabdominal aorta. There are mild degenerate changes of the spine. Minimal anterior wedging of a vertebrae at the thoracolumbar junction likely chronic. IMPRESSION: Moderate cardiomegaly with mild vascular congestion and small bilateral pleural effusions likely with associated bibasilar atelectasis. Electronically Signed   By: Elberta Fortis M.D.   On: 04/19/2015 17:28   2D Echocardiogram 04/21/2015  Study Conclusions  - Left ventricle: The cavity size was normal. There was mild concentric hypertrophy. Systolic function was moderately to severely reduced. The estimated ejection fraction was in the range of 30% to 35%. Hypokinesis of the anterior myocardium. Hypokinesis of the anteroseptal myocardium. Hypokinesis of the apical myocardium. Left ventricular diastolic function parameters were normal. - Mitral valve:  Calcified annulus. There was mild regurgitation. - Left atrium: The atrium was mildly dilated. - Right ventricle: Systolic function was normal. - Pulmonary arteries: Systolic pressure was mildly dilated. PA peak pressure: 36 mm Hg (S). _____________   Cardiac Catheterization 2.23.2017          Assessment & Plan    1. Acute on chronic combined CHF: -Echo with EF of 30-35%, anterior and anteroseptal wall HK -She has diuresed 6.6L this admission with 8 lbs wt loss.   -EDP 10-12 on cath 2/23. -Now on PO lasix. -Toprol-XL d/c'd 2/2 bradycardia down into the 40's overnight. -Continue losartan 25 mg daily -Spiro added. -Will need f/u echo in 40 days to reeval and consider ICD.  2. NSTEMI:   -Peak of 1.05, asymptomatic -Cath 2/23 w/ moderate RCA/LCX dzs  Med Rx. -No chest pain. -BB d/c'd 2/2 bradycardia. -Cont asa, arb, statin.  3. Persistent Afib: -Rate controlled.   -Toprol and Digoxin have been held 2/2 bradycarida and were d/c'd this AM. -She remains on Coumadin - INR 1.47. -CHADS2VASc at least 5 (CHF, HTN, age x 2, female)  4. Accelerated HTN: -Stable on ARB/spiro.  5. Hypokalemia: -improved.  6. Obesity: -Possible sleep apnea. She would benefit from an outpatient sleep study. -Cardiac rehab.  Signed, Nicolasa Ducking NP

## 2015-04-24 NOTE — Care Management (Signed)
No recommendations by physical therapy.  Anticipate discharge back to [facility] at discharge

## 2015-04-24 NOTE — Progress Notes (Signed)
HR down into 30s when sleeping. Digoxin stopped earlier i day and Toporol dose reduced. Will stop Toprol XL. Monitor on Tele.

## 2015-04-25 LAB — BASIC METABOLIC PANEL
ANION GAP: 7 (ref 5–15)
BUN: 22 mg/dL — ABNORMAL HIGH (ref 6–20)
CALCIUM: 8.4 mg/dL — AB (ref 8.9–10.3)
CO2: 24 mmol/L (ref 22–32)
Chloride: 106 mmol/L (ref 101–111)
Creatinine, Ser: 0.73 mg/dL (ref 0.44–1.00)
Glucose, Bld: 95 mg/dL (ref 65–99)
Potassium: 3.3 mmol/L — ABNORMAL LOW (ref 3.5–5.1)
Sodium: 137 mmol/L (ref 135–145)

## 2015-04-25 LAB — CBC
HEMATOCRIT: 37 % (ref 35.0–47.0)
Hemoglobin: 12.8 g/dL (ref 12.0–16.0)
MCH: 31.3 pg (ref 26.0–34.0)
MCHC: 34.5 g/dL (ref 32.0–36.0)
MCV: 90.6 fL (ref 80.0–100.0)
PLATELETS: 211 10*3/uL (ref 150–440)
RBC: 4.08 MIL/uL (ref 3.80–5.20)
RDW: 15.7 % — AB (ref 11.5–14.5)
WBC: 7.2 10*3/uL (ref 3.6–11.0)

## 2015-04-25 LAB — PROTIME-INR
INR: 1.38
Prothrombin Time: 17.1 seconds — ABNORMAL HIGH (ref 11.4–15.0)

## 2015-04-25 MED ORDER — ASPIRIN 81 MG PO TBEC: 81.0000 mg | DELAYED_RELEASE_TABLET | Freq: Every day | ORAL | Status: DC

## 2015-04-25 MED ORDER — LOSARTAN POTASSIUM 50 MG PO TABS: 50.0000 mg | ORAL_TABLET | Freq: Every day | ORAL | Status: DC

## 2015-04-25 MED ORDER — POTASSIUM CHLORIDE CRYS ER 20 MEQ PO TBCR
40.0000 meq | EXTENDED_RELEASE_TABLET | Freq: Once | ORAL | Status: AC
  Administered 2015-04-25: 40 meq via ORAL
  Filled 2015-04-25: qty 2

## 2015-04-25 MED ORDER — LOSARTAN POTASSIUM 25 MG PO TABS
25.0000 mg | ORAL_TABLET | Freq: Once | ORAL | Status: AC
  Administered 2015-04-25: 25 mg via ORAL
  Filled 2015-04-25: qty 1

## 2015-04-25 MED ORDER — ATORVASTATIN CALCIUM 40 MG PO TABS: 40.0000 mg | ORAL_TABLET | Freq: Every day | ORAL | Status: AC

## 2015-04-25 MED ORDER — METOPROLOL SUCCINATE ER 25 MG PO TB24
12.5000 mg | ORAL_TABLET | Freq: Every day | ORAL | Status: DC
Start: 2015-04-25 — End: 2015-06-15

## 2015-04-25 MED ORDER — POTASSIUM CHLORIDE ER 10 MEQ PO TBCR: 10.0000 meq | EXTENDED_RELEASE_TABLET | Freq: Every day | ORAL | Status: DC

## 2015-04-25 MED ORDER — SPIRONOLACTONE 25 MG PO TABS: 12.5000 mg | ORAL_TABLET | Freq: Every day | ORAL | Status: DC

## 2015-04-25 MED ORDER — MAGNESIUM OXIDE 400 (241.3 MG) MG PO TABS: 400.0000 mg | ORAL_TABLET | Freq: Two times a day (BID) | ORAL | Status: DC

## 2015-04-25 MED ORDER — WARFARIN SODIUM 1 MG PO TABS: 2.0000 mg | ORAL_TABLET | Freq: Every day | ORAL | Status: DC

## 2015-04-25 MED ORDER — FUROSEMIDE 40 MG PO TABS: 40.0000 mg | ORAL_TABLET | Freq: Every day | ORAL | Status: DC

## 2015-04-25 NOTE — Discharge Instructions (Signed)
Heart Failure Clinic appointment on May 05, 2015 at 9:00am with Clarisa Kindred, FNP. Please call [phone] to reschedule.    DIET:  Cardiac diet  DISCHARGE CONDITION:  Stable  ACTIVITY:  Activity as tolerated  OXYGEN:  Home Oxygen: No.   Oxygen Delivery: room air  DISCHARGE LOCATION:  [facility]    ADDITIONAL DISCHARGE INSTRUCTION:   If you experience worsening of your admission symptoms, develop shortness of breath, life threatening emergency, suicidal or homicidal thoughts you must seek medical attention immediately by calling 911 or calling your MD immediately  if symptoms less severe.  You Must read complete instructions/literature along with all the possible adverse reactions/side effects for all the Medicines you take and that have been prescribed to you. Take any new Medicines after you have completely understood and accpet all the possible adverse reactions/side effects.   Please note  You were cared for by a hospitalist during your hospital stay. If you have any questions about your discharge medications or the care you received while you were in the hospital after you are discharged, you can call the unit and asked to speak with the hospitalist on call if the hospitalist that took care of you is not available. Once you are discharged, your primary care physician will handle any further medical issues. Please note that NO REFILLS for any discharge medications will be authorized once you are discharged, as it is imperative that you return to your primary care physician (or establish a relationship with a primary care physician if you do not have one) for your aftercare needs so that they can reassess your need for medications and monitor your lab values.

## 2015-04-25 NOTE — Consult Note (Signed)
ANTICOAGULATION CONSULT NOTE - Follow up Consult  Pharmacy Consult for Warfarin Indication: atrial fibrillation  No Known Allergies  Patient Measurements: Height:  (160 cm) Weight: 189 lb 1.6 oz (85.775 kg) IBW/kg (Calculated) : 52.4  Vital Signs: Temp: 97.8 F (36.6 C) (02/25 0409) Temp Source: Oral (02/25 0409) BP: 167/67 mmHg (02/25 0409) Pulse Rate: 44 (02/25 0409)  Labs:  Recent Labs  04/23/15 0130 04/24/15 0456 04/25/15 0449  HGB 13.0  --  12.8  HCT 38.4  --  37.0  PLT 246  --  211  LABPROT 18.5* 17.9* 17.1*  INR 1.54 1.47 1.38  HEPARINUNFRC 0.16*  --   --   CREATININE  --   --  0.73    Estimated Creatinine Clearance: 50.5 mL/min (by C-G formula based on Cr of 0.73).   Medical History: Past Medical History  Diagnosis Date  . HTN (hypertension)   . Hyperlipidemia   . PAF (paroxysmal atrial fibrillation) (HCC)     a. on Coumadin; b. CHADS2VASc = 5 (CHF, HTN, age x 2, female)  . Peripheral neuralgia   . Arthritis     Medications:  Scheduled:  . aspirin EC  81 mg Oral Daily  . atorvastatin  40 mg Oral q1800  . furosemide  40 mg Oral Daily  . losartan  25 mg Oral Daily  . magnesium oxide  400 mg Oral BID  . metoprolol succinate  12.5 mg Oral Daily  . senna-docusate  2 tablet Oral BID  . sodium chloride flush  3 mL Intravenous Q12H  . sodium chloride flush  3 mL Intravenous Q12H  . spironolactone  12.5 mg Oral Daily  . warfarin  1.5 mg Oral q1800  . Warfarin - Pharmacist Dosing Inpatient   Does not apply q1800    Assessment: DP is an 82 yo female admitted for increased SOB and LE edema 2/2 suspected CHF exacerbation. She is on warfarin for Afib. Pharmacy consulted to dose warfarin in this patient.  PTA patient on warfarin  daily.  Upon admission patient's INR was supratherapeutic at 3.32.  2/22: INR: 1.76-warfarin held for cardiac cath and heparin drip started  2/23: INR: 1.54- warfarin 1.5 mg  2/24: INR: 1.47- warfarin 1.5mg   2/25:  INR: 1.38   Goal of Therapy:  INR 2-3 Monitor platelets by anticoagulation protocol: Yes   Plan:  INR continues to trend down, increase patient back up to home dose of warfarin  daily.  Will follow with INR in AM.  Pharmacy will continue to follow.  Roque Cash, PharmD Pharmacy Resident 04/25/2015

## 2015-04-25 NOTE — Discharge Summary (Signed)
Kathy Lowery, 82 y.o., DOB 1924-09-21, MRN [id number]. Admission date: 04/19/2015 Discharge Date 04/25/2015 Primary MD Danella Penton., MD Admitting Physician Milagros Loll, MD  Admission Diagnosis  Acute systolic congestive heart failure (HCC) [I50.21] Elevated troponin I level [R79.89]  Discharge Diagnosis   Principal Problem:   Acute combined systolic and diastolic heart failure (HCC) Active Problems:   NSTEMI (non-ST elevated myocardial infarction) (HCC)   Persistent atrial fibrillation (HCC)   Current use of long term anticoagulation   Essential hypertension   Obesity (BMI 30-39.9)   Accelerated hypertension with diastolic congestive heart failure, NYHA class 3 (HCC)   Hypokalemia    Bradycardia        Hospital Course  Kathy Lowery is a 82 y.o. female with a known history of atrial fibrillation, hypertension presents to the emergency room complaining of worsening lower extremity swelling and shortness of breath. Patient has also had cough. She complains of pleuritic chest pain on coughing. Patient was admitted with acute CHF subsequently noted to be mixed systolic and diastolic congestive heart failure. She was treated with Lasix. Patient was also noticed to have elevated troponin and with her chest pain she underwent a cardiac catheterization. The cardiac catheter showed significant two-vessel coronary artery disease involving the left circumflex and right coronary artery. LAD was patent. Medical therapy was recommended. Patient also was started on digoxin. Heart rate subsequently was noted to be in the 30s stage oxygen has been discontinued her beta blocker is at much lower dose. Patient is stable. She'll be discharged back to [facility] [place] where she stays. With physical therapy and nursing.             Consults  cardiology  Significant Tests:  See full reports for all details   Dg Chest 2 View  04/19/2015  CLINICAL DATA:  Dry cough and sinus pressure as well as weakness.  EXAM: CHEST  2 VIEW COMPARISON:  None. FINDINGS: Lungs are adequately inflated and demonstrate small bilateral pleural effusions likely with associated bibasilar atelectasis. There is mild prominence of the perihilar markings suggesting mild degree of vascular congestion. Mild to moderate cardiomegaly. Calcified plaque is present over the thoracoabdominal aorta. There are mild degenerate changes of the spine. Minimal anterior wedging of a vertebrae at the thoracolumbar junction likely chronic. IMPRESSION: Moderate cardiomegaly with mild vascular congestion and small bilateral pleural effusions likely with associated bibasilar atelectasis. Electronically Signed   By: Elberta Fortis M.D.   On: 04/19/2015 17:28       Today   Subjective:   Kathy Lowery  feels well denies any chest pain or shortness of breath  Objective:   Blood pressure 167/67, pulse 44, temperature 97.8 F (36.6 C), temperature source Oral, resp. rate 19, height 5\' 3"  (1.6 m), weight 85.775 kg (189 lb 1.6 oz), SpO2 96 %.  .  Intake/Output Summary (Last 24 hours) at 04/25/15 0908 Last data filed at 04/25/15 1324  Gross per 24 hour  Intake    480 ml  Output   1000 ml  Net   -520 ml    Exam VITAL SIGNS: Blood pressure 167/67, pulse 44, temperature 97.8 F (36.6 C), temperature source Oral, resp. rate 19, height 5\' 3"  (1.6 m), weight 85.775 kg (189 lb 1.6 oz), SpO2 96 %.  GENERAL:  82 y.o.-year-old patient lying in the bed with no acute distress.  EYES: Pupils equal, round, reactive to light and accommodation. No scleral icterus. Extraocular muscles intact.  HEENT: Head atraumatic, normocephalic. Oropharynx and nasopharynx clear.  NECK:  Supple, no jugular venous distention. No thyroid enlargement, no tenderness.  LUNGS: Normal breath sounds bilaterally, no wheezing, rales,rhonchi or crepitation. No use of accessory muscles of respiration.  CARDIOVASCULAR: S1, S2 normal. No murmurs, rubs, or gallops.  ABDOMEN: Soft,  nontender, nondistended. Bowel sounds present. No organomegaly or mass.  EXTREMITIES: No pedal edema, cyanosis, or clubbing.  NEUROLOGIC: Cranial nerves II through XII are intact. Muscle strength 5/5 in all extremities. Sensation intact. Gait not checked.  PSYCHIATRIC: The patient is alert and oriented x 3.  SKIN: No obvious rash, lesion, or ulcer.   Data Review     CBC w Diff:  Lab Results  Component Value Date   WBC 7.2 04/25/2015   WBC 11.1* 07/11/2011   HGB 12.8 04/25/2015   HGB 13.5 07/11/2011   HCT 37.0 04/25/2015   HCT 39.4 07/11/2011   PLT 211 04/25/2015   PLT 213 07/11/2011   LYMPHOPCT 26 04/19/2015   LYMPHOPCT 17.1 07/11/2011   MONOPCT 8 04/19/2015   MONOPCT 9.0 07/11/2011   EOSPCT 2 04/19/2015   EOSPCT 1.7 07/11/2011   BASOPCT 1 04/19/2015   BASOPCT 0.5 07/11/2011   CMP:  Lab Results  Component Value Date   NA 137 04/25/2015   NA 139 07/11/2011   K 3.3* 04/25/2015   K 3.4* 07/11/2011   CL 106 04/25/2015   CL 105 07/11/2011   CO2 24 04/25/2015   CO2 28 07/11/2011   BUN 22* 04/25/2015   BUN 18 07/11/2011   CREATININE 0.73 04/25/2015   CREATININE 0.76 07/11/2011   PROT 7.7 04/19/2015   ALBUMIN 3.8 04/19/2015   BILITOT 1.2 04/19/2015   ALKPHOS 68 04/19/2015   AST 17 04/19/2015   ALT 10* 04/19/2015  .  Micro Results Recent Results (from the past 240 hour(s))  MRSA PCR Screening     Status: None   Collection Time: 04/19/15  7:12 PM  Result Value Ref Range Status   MRSA by PCR NEGATIVE NEGATIVE Final    Comment:        The GeneXpert MRSA Assay (FDA approved for NASAL specimens only), is one component of a comprehensive MRSA colonization surveillance program. It is not intended to diagnose MRSA infection nor to guide or monitor treatment for MRSA infections.         Code Status Orders        Start     Ordered   04/19/15 2109  Do not attempt resuscitation (DNR)   Continuous    Question Answer Comment  In the event of cardiac or  respiratory ARREST Do not call a "code blue"   In the event of cardiac or respiratory ARREST Do not perform Intubation, CPR, defibrillation or ACLS   In the event of cardiac or respiratory ARREST Use medication by any route, position, wound care, and other measures to relive pain and suffering. May use oxygen, suction and manual treatment of airway obstruction as needed for comfort.      04/19/15 2108    Code Status History    Date Active Date Inactive Code Status Order ID Comments User Context   04/19/2015  8:51 PM 04/19/2015  9:08 PM Full Code [id number]  Milagros Loll, MD ED              Follow-up Information    Follow up with Delma Freeze, FNP. Go on 05/05/2015.   Specialty:  Family Medicine   Why:  at 9:00am , to the [facility]   Contact  information:   [address] [place] [address] [phone]       Follow up with Danella Penton., MD In 5 days.   Specialty:  Internal Medicine   Contact information:   [phone] [facility] [address] [facility] [place] [address] [phone]       Follow up with Lorine Bears, MD In 7 days.   Specialty:  Cardiology   Contact information:   [address] [place] [address] [phone]       Discharge Medications     Medication List    STOP taking these medications        digoxin 0.125 MG tablet  Commonly known as:  LANOXIN     triamterene-hydrochlorothiazide 37.5-25 MG tablet  Commonly known as:  MAXZIDE-25      TAKE these medications        aspirin 81 MG EC tablet  Take 1 tablet (81 mg total) by mouth daily.     atorvastatin 40 MG tablet  Commonly known as:  LIPITOR  Take 1 tablet (40 mg total) by mouth daily at 6 PM.     furosemide 40 MG tablet  Commonly known as:  LASIX  Take 1 tablet (40 mg total) by mouth daily.     gemfibrozil 600 MG tablet  Commonly known as:  LOPID  Take 600 mg by mouth 2 (two) times daily before a meal.      losartan 50 MG tablet  Commonly known as:  COZAAR  Take 1 tablet (50 mg total) by mouth daily.     magnesium oxide 400 (241.3 Mg) MG tablet  Commonly known as:  MAG-OX  Take 1 tablet (400 mg total) by mouth 2 (two) times daily.     metoprolol succinate 25 MG 24 hr tablet  Commonly known as:  TOPROL-XL  Take 0.5 tablets (12.5 mg total) by mouth daily.     potassium chloride 10 MEQ tablet  Commonly known as:  K-DUR  Take 1 tablet (10 mEq total) by mouth daily.     spironolactone 25 MG tablet  Commonly known as:  ALDACTONE  Take 0.5 tablets (12.5 mg total) by mouth daily.     warfarin 2 MG tablet  Commonly known as:  COUMADIN  Take 2 mg by mouth daily.           Total Time in preparing paper work, data evaluation and todays exam - 35 minutes  Auburn Bilberry M.D on 04/25/2015 at 9:08 AM  [facility]   Office  [phone]

## 2015-04-25 NOTE — Progress Notes (Signed)
Discharge instructions along with home medication list and follow up gone over with patient and daughter. Both verbalized that they understood instructions printed rx given to patient. Patient to be discharged home with home health. Patient on ra. No distress noted. Iv and telemetry removed. Patient taken out with personal wheelchair.

## 2015-04-25 NOTE — Care Management Note (Signed)
Case Management Note  Patient Details  Name: Kathy Lowery MRN: [id number] Date of Birth: [birth year]  Subjective/Objective:   Discussed discharge planning with daughter Kerrie Buffalo [phone] on the phone this morning. Family chose [facility] to be [name]'s home health provider. Family is requesting start of care to be Sunday 04/26/15. This request was called to [facility] this morning by this Clinical research associate. A home health referral for home health PT, RN, Aid was called and faxed to [facility]. [name]'s nurse Aggie Cosier will review discharge instructions with [name] and her daughter Clydie Braun who will transport her home to [facility] after 12 noon today. Please call Lyn CM at [phone] if discharge questions by family arise.                  Action/Plan:   Expected Discharge Date:                  Expected Discharge Plan:  Home/Self Care  In-House Referral:     Discharge planning Services  CM Consult  Post Acute Care Choice:  Home Health Choice offered to:  Patient  DME Arranged:    DME Agency:     HH Arranged:  Patient Refused HH Agency:     Status of Service:  In process, will continue to follow  Medicare Important Message Given:  Yes Date Medicare IM Given:    Medicare IM give by:    Date Additional Medicare IM Given:    Additional Medicare Important Message give by:     If discussed at Long Length of Stay Meetings, dates discussed:    Additional Comments:  Robi Mitter A, RN 04/25/2015, 9:40 AM

## 2015-04-25 NOTE — Progress Notes (Signed)
Spoke with dr. Allena Katz to make aware patient blood pressure was sbp 160's. Per md will place order for losartan  po once. Patient still okay to be discharged home.

## 2015-04-27 ENCOUNTER — Telehealth: Payer: Self-pay

## 2015-04-27 NOTE — Telephone Encounter (Signed)
Patient contacted regarding discharge from [facility] on 04/27/15.  Patient understands to follow up with Dr. Kirke Corin on 05/04/15 at 10:30 at [facility]. Patient understands discharge instructions? yes Patient understands medications and regiment? yes Patient understands to bring all medications to this visit? yes

## 2015-04-27 NOTE — Telephone Encounter (Signed)
-----   Message from Coralee Rud sent at 04/27/2015 12:39 PM EST ----- Regarding: tcm/ph 3/6 10:30 Dr. Kirke Corin

## 2015-05-02 ENCOUNTER — Emergency Department
Admission: EM | Admit: 2015-05-02 | Discharge: 2015-05-02 | Disposition: A | Discharge status: Home / Self Care | Payer: Medicare Other | Attending: Student | Admitting: Student

## 2015-05-02 ENCOUNTER — Emergency Department: Payer: Medicare Other

## 2015-05-02 DIAGNOSIS — Z7982 Long term (current) use of aspirin: Secondary | ICD-10-CM | POA: Insufficient documentation

## 2015-05-02 DIAGNOSIS — W07XXXA Fall from chair, initial encounter: Secondary | ICD-10-CM | POA: Diagnosis not present

## 2015-05-02 DIAGNOSIS — S63502A Unspecified sprain of left wrist, initial encounter: Secondary | ICD-10-CM | POA: Diagnosis not present

## 2015-05-02 DIAGNOSIS — Y998 Other external cause status: Secondary | ICD-10-CM | POA: Diagnosis not present

## 2015-05-02 DIAGNOSIS — I10 Essential (primary) hypertension: Secondary | ICD-10-CM | POA: Insufficient documentation

## 2015-05-02 DIAGNOSIS — Y9389 Activity, other specified: Secondary | ICD-10-CM | POA: Diagnosis not present

## 2015-05-02 DIAGNOSIS — Z7901 Long term (current) use of anticoagulants: Secondary | ICD-10-CM | POA: Insufficient documentation

## 2015-05-02 DIAGNOSIS — Y92009 Unspecified place in unspecified non-institutional (private) residence as the place of occurrence of the external cause: Secondary | ICD-10-CM | POA: Diagnosis not present

## 2015-05-02 DIAGNOSIS — S6992XA Unspecified injury of left wrist, hand and finger(s), initial encounter: Secondary | ICD-10-CM | POA: Diagnosis present

## 2015-05-02 DIAGNOSIS — Z79899 Other long term (current) drug therapy: Secondary | ICD-10-CM | POA: Diagnosis not present

## 2015-05-02 DIAGNOSIS — Z87891 Personal history of nicotine dependence: Secondary | ICD-10-CM | POA: Insufficient documentation

## 2015-05-02 MED ORDER — ACETAMINOPHEN-CODEINE #3 300-30 MG PO TABS: 1.0000 | ORAL_TABLET | ORAL | Status: DC | PRN

## 2015-05-02 MED ORDER — ACETAMINOPHEN-CODEINE #3 300-30 MG PO TABS
1.0000 | ORAL_TABLET | Freq: Once | ORAL | Status: AC
  Administered 2015-05-02: 1 via ORAL
  Filled 2015-05-02: qty 1

## 2015-05-02 NOTE — ED Notes (Signed)
Pt's daughter called for update, phone given to pt to speak with her.

## 2015-05-02 NOTE — ED Notes (Signed)
Pt states she was trying to get out of chair, L wrist slipped and got jammed on seat. L wrist reddened, swollen. Pt states she has been using ice with no relief.

## 2015-05-02 NOTE — ED Notes (Signed)
Patient reports on Thursday getting up from her recliner and her left hand slipped between the cushion and the frame.  Patient reports pain to left wrist since then.

## 2015-05-02 NOTE — ED Notes (Signed)
Discussed discharge instructions, prescriptions, and follow-up care with patient. No questions or concerns at this time. Pt stable at discharge.  

## 2015-05-02 NOTE — ED Notes (Signed)
Yellow fall braclet placed on patients right wrist. 

## 2015-05-02 NOTE — ED Provider Notes (Signed)
[facility] Emergency Department Provider Note  ____________________________________________  Time seen: Approximately 7:18 AM  I have reviewed the triage vital signs and the nursing notes.   HISTORY  Chief Complaint Wrist Pain    HPI Kathy Lowery is a 82 y.o. female presents for evaluation of left wrist pain. She reports that she tripped and fell at home off her chair 2 days ago. Denies any loss consciousness or any other injury. States that the all she's been taking is Tylenol for the pain. Is concerned about fracture. Pain is worse with lifting or moving and eased off were better with Tylenol and rest.   Past Medical History  Diagnosis Date  . HTN (hypertension)   . Hyperlipidemia   . PAF (paroxysmal atrial fibrillation) (HCC)     a. on Coumadin; b. CHADS2VASc = 5 (CHF, HTN, age x 2, female)  . Peripheral neuralgia   . Arthritis     Patient Active Problem List   Diagnosis Date Noted  . Acute combined systolic and diastolic heart failure (HCC) 04/22/2015  . NSTEMI (non-ST elevated myocardial infarction) (HCC) 04/22/2015  . Persistent atrial fibrillation (HCC) 04/22/2015  . Current use of long term anticoagulation 04/22/2015  . Essential hypertension 04/22/2015  . Obesity (BMI 30-39.9) 04/22/2015  . Accelerated hypertension with diastolic congestive heart failure, NYHA class 3 (HCC) 04/22/2015  . Hypokalemia 04/22/2015  . CHF (congestive heart failure) (HCC) 04/19/2015    Past Surgical History  Procedure Laterality Date  . Appendectomy    . Lumbar spine surgery    . Cardiac catheterization N/A 04/23/2015    Procedure: Left Heart Cath and Coronary Angiography;  Surgeon: Iran Ouch, MD;  Location: [facility] INVASIVE CV LAB;  Service: Cardiovascular;  Laterality: N/A;    Current Outpatient Rx  Name  Route  Sig  Dispense  Refill  . acetaminophen-codeine (TYLENOL #3) 300-30 MG tablet   Oral   Take 1 tablet by mouth every 4 (four) hours as  needed for moderate pain.   30 tablet   0   . aspirin EC 81 MG EC tablet   Oral   Take 1 tablet (81 mg total) by mouth daily.         Marland Kitchen atorvastatin (LIPITOR) 40 MG tablet   Oral   Take 1 tablet (40 mg total) by mouth daily at 6 PM.   30 tablet   0   . furosemide (LASIX) 40 MG tablet   Oral   Take 1 tablet (40 mg total) by mouth daily.   30 tablet   0   . gemfibrozil (LOPID) 600 MG tablet   Oral   Take 600 mg by mouth 2 (two) times daily before a meal.         . losartan (COZAAR) 50 MG tablet   Oral   Take 1 tablet (50 mg total) by mouth daily.   30 tablet   0   . magnesium oxide (MAG-OX) 400 (241.3 Mg) MG tablet   Oral   Take 1 tablet (400 mg total) by mouth 2 (two) times daily.   60 tablet   0   . metoprolol succinate (TOPROL-XL) 25 MG 24 hr tablet   Oral   Take 0.5 tablets (12.5 mg total) by mouth daily.   30 tablet   0   . potassium chloride (K-DUR) 10 MEQ tablet   Oral   Take 1 tablet (10 mEq total) by mouth daily.   30 tablet   0   .  spironolactone (ALDACTONE) 25 MG tablet   Oral   Take 0.5 tablets (12.5 mg total) by mouth daily.   30 tablet   0   . warfarin (COUMADIN) 2 MG tablet   Oral   Take 2 mg by mouth daily.           Allergies Review of patient's allergies indicates no known allergies.  Family History  Problem Relation Age of Onset  . CAD Father   . Hypertension Father   . Tuberculosis Mother     Social History Social History  Substance Use Topics  . Smoking status: Former Games developermoker  . Smokeless tobacco: Not on file  . Alcohol Use: Not on file    Review of Systems Cardiovascular: Denies chest pain. Respiratory: Denies shortness of breath. Genitourinary: Negative for dysuria. Musculoskeletal: Positive for left wrist pain Skin: Negative for rash. Neurological: Negative for headaches, focal weakness or numbness.  10-point ROS otherwise negative.  ____________________________________________   PHYSICAL  EXAM:  VITAL SIGNS: ED Triage Vitals  Enc Vitals Group     BP 05/02/15 0642 179/98 mmHg     Pulse Rate 05/02/15 0642 77     Resp 05/02/15 0642 20     Temp 05/02/15 0642 98.2 F (36.8 C)     Temp Source 05/02/15 0642 Oral     SpO2 05/02/15 0642 99 %     Weight 05/02/15 0642 188 lb (85.276 kg)     Height 05/02/15 0642 5' 3.5" (1.613 m)     Head Cir --      Peak Flow --      Pain Score 05/02/15 0643 8     Pain Loc --      Pain Edu? --      Excl. in GC? --     Constitutional: Alert and oriented. Well appearing and in no acute distress. Musculoskeletal: No lower extremity tenderness nor edema.  No joint effusions. Left wrist with limited range of motion distally neurovascularly intact positive edema noted both laterally and medially. Capillary refill. Good pulses. No obvious deformity Neurologic:  Normal speech and language. No gross focal neurologic deficits are appreciated. No gait instability. Skin:  Skin is warm, dry and intact. No rash noted. Psychiatric: Mood and affect are normal. Speech and behavior are normal.  ____________________________________________   LABS (all labs ordered are listed, but only abnormal results are displayed)  Labs Reviewed - No data to display ____________________________________________   RADIOLOGY  No acute osseous findings. Negative for fracture dislocation. ____________________________________________   PROCEDURES  Procedure(s) performed: None  Critical Care performed: No  ____________________________________________   INITIAL IMPRESSION / ASSESSMENT AND PLAN / ED COURSE  Pertinent labs & imaging results that were available during my care of the patient were reviewed by me and considered in my medical decision making (see chart for details).  Acute left wrist sprain. Rx given for Tylenol 3 one every 4-6 hours as needed for pain. Keep current provider appointment is follow-up with PCP or return here with any worsening  symptomology. Patient was given a referral to Dr. Martha ClanKrasinski, orthopedics on call to follow-up if needed. She voices no other emergency medical complaints at this time. ____________________________________________   FINAL CLINICAL IMPRESSION(S) / ED DIAGNOSES  Final diagnoses:  Wrist sprain, left, initial encounter     Evangeline Dakinharles M Sarim Rothman, PA-C 05/02/15 0749  Evangeline Dakinharles M Anwitha Mapes, PA-C 05/02/15 [id number]  Gayla DossEryka A Gayle, MD 05/02/15 [phone]

## 2015-05-02 NOTE — Discharge Instructions (Signed)
Cryotherapy °Cryotherapy means treatment with cold. Ice or gel packs can be used to reduce both pain and swelling. Ice is the most helpful within the first 24 to 48 hours after an injury or flare-up from overusing a muscle or joint. Sprains, strains, spasms, burning pain, shooting pain, and aches can all be eased with ice. Ice can also be used when recovering from surgery. Ice is effective, has very few side effects, and is safe for most people to use. °PRECAUTIONS  °Ice is not a safe treatment option for people with: °· Raynaud phenomenon. This is a condition affecting small blood vessels in the extremities. Exposure to cold may cause your problems to return. °· Cold hypersensitivity. There are many forms of cold hypersensitivity, including: °· Cold urticaria. Red, itchy hives appear on the skin when the tissues begin to warm after being iced. °· Cold erythema. This is a red, itchy rash caused by exposure to cold. °· Cold hemoglobinuria. Red blood cells break down when the tissues begin to warm after being iced. The hemoglobin that carry oxygen are passed into the urine because they cannot combine with blood proteins fast enough. °· Numbness or altered sensitivity in the area being iced. °If you have any of the following conditions, do not use ice until you have discussed cryotherapy with your caregiver: °· Heart conditions, such as arrhythmia, angina, or chronic heart disease. °· High blood pressure. °· Healing wounds or open skin in the area being iced. °· Current infections. °· Rheumatoid arthritis. °· Poor circulation. °· Diabetes. °Ice slows the blood flow in the region it is applied. This is beneficial when trying to stop inflamed tissues from spreading irritating chemicals to surrounding tissues. However, if you expose your skin to cold temperatures for too long or without the proper protection, you can damage your skin or nerves. Watch for signs of skin damage due to cold. °HOME CARE INSTRUCTIONS °Follow  these tips to use ice and cold packs safely. °· Place a dry or damp towel between the ice and skin. A damp towel will cool the skin more quickly, so you may need to shorten the time that the ice is used. °· For a more rapid response, add gentle compression to the ice. °· Ice for no more than 10 to 20 minutes at a time. The bonier the area you are icing, the less time it will take to get the benefits of ice. °· Check your skin after 5 minutes to make sure there are no signs of a poor response to cold or skin damage. °· Rest 20 minutes or more between uses. °· Once your skin is numb, you can end your treatment. You can test numbness by very lightly touching your skin. The touch should be so light that you do not see the skin dimple from the pressure of your fingertip. When using ice, most people will feel these normal sensations in this order: cold, burning, aching, and numbness. °· Do not use ice on someone who cannot communicate their responses to pain, such as small children or people with dementia. °HOW TO MAKE AN ICE PACK °Ice packs are the most common way to use ice therapy. Other methods include ice massage, ice baths, and cryosprays. Muscle creams that cause a cold, tingly feeling do not offer the same benefits that ice offers and should not be used as a substitute unless recommended by your caregiver. °To make an ice pack, do one of the following: °· Place crushed ice or a   bag of frozen vegetables in a sealable plastic bag. Squeeze out the excess air. Place this bag inside another plastic bag. Slide the bag into a pillowcase or place a damp towel between your skin and the bag. °· Mix 3 parts water with 1 part rubbing alcohol. Freeze the mixture in a sealable plastic bag. When you remove the mixture from the freezer, it will be slushy. Squeeze out the excess air. Place this bag inside another plastic bag. Slide the bag into a pillowcase or place a damp towel between your skin and the bag. °SEEK MEDICAL CARE  IF: °· You develop white spots on your skin. This may give the skin a blotchy (mottled) appearance. °· Your skin turns blue or pale. °· Your skin becomes waxy or hard. °· Your swelling gets worse. °MAKE SURE YOU:  °· Understand these instructions. °· Will watch your condition. °· Will get help right away if you are not doing well or get worse. °  °This information is not intended to replace advice given to you by your health care provider. Make sure you discuss any questions you have with your health care provider. °  °Document Released: 10/11/2010 Document Revised: 03/07/2014 Document Reviewed: 10/11/2010 °Elsevier Interactive Patient Education ©2016 [employer] °Wrist Sprain °A wrist sprain is a stretch or tear in the strong, fibrous tissues (ligaments) that connect your wrist bones. The ligaments of your wrist may be easily sprained. There are three types of wrist sprains. °· Grade 1. The ligament is not stretched or torn, but the sprain causes pain. °· Grade 2. The ligament is stretched or partially torn. You may be able to move your wrist, but not very much. °· Grade 3. The ligament or muscle completely tears. You may find it difficult or extremely painful to move your wrist even a little. °CAUSES °Often, wrist sprains are a result of a fall or an injury. The force of the impact causes the fibers of your ligament to stretch too much or tear. Common causes of wrist sprains include: °· Overextending your wrist while catching a ball with your hands. °· Repetitive or strenuous extension or bending of your wrist. °· Landing on your hand during a fall. °RISK FACTORS °· Having previous wrist injuries. °· Playing contact sports, such as boxing or wrestling. °· Participating in activities in which falling is common. °· Having poor wrist strength and flexibility. °SIGNS AND SYMPTOMS °· Wrist pain. °· Wrist tenderness. °· Inflammation or bruising of the wrist area. °· Hearing a "pop" or feeling a tear at the time of the  injury. °· Decreased wrist movement due to pain, stiffness, or weakness. °DIAGNOSIS °Your health care provider will examine your wrist. In some cases, an X-ray will be taken to make sure you did not break any bones. If your health care provider thinks that you tore a ligament, he or she may order an MRI of your wrist. °TREATMENT °Treatment involves resting and icing your wrist. You may also need to take pain medicines to help lessen pain and inflammation. Your health care provider may recommend keeping your wrist still (immobilized) with a splint to help your sprain heal. When the splint is no longer necessary, you may need to perform strengthening and stretching exercises. These exercises help you to regain strength and full range of motion in your wrist. Surgery is not usually needed for wrist sprains unless the ligament completely tears. °HOME CARE INSTRUCTIONS °· Rest your wrist. Do not do things that cause pain. °· Wear   your wrist splint as directed by your health care provider. °· Take medicines only as directed by your health care provider. °· To ease pain and swelling, apply ice to the injured area. °¨ Put ice in a plastic bag. °¨ Place a towel between your skin and the bag. °¨ Leave the ice on for 20 minutes, 2-3 times a day. °SEEK MEDICAL CARE IF: °· Your pain, discomfort, or swelling gets worse even with treatment. °· You feel sudden numbness in your hand. °  °This information is not intended to replace advice given to you by your health care provider. Make sure you discuss any questions you have with your health care provider. °  °Document Released: 10/18/2013 Document Reviewed: 10/18/2013 °Elsevier Interactive Patient Education ©2016 Elsevier Inc. ° °

## 2015-05-04 ENCOUNTER — Ambulatory Visit (INDEPENDENT_AMBULATORY_CARE_PROVIDER_SITE_OTHER): Payer: Medicare Other | Admitting: Cardiovascular Disease

## 2015-05-04 ENCOUNTER — Encounter: Payer: Self-pay | Admitting: Cardiovascular Disease

## 2015-05-04 ENCOUNTER — Telehealth: Payer: Self-pay | Admitting: Cardiovascular Disease

## 2015-05-04 VITALS — BP 96/60 | HR 73 | Ht 63.5 in | Wt 189.0 lb

## 2015-05-04 DIAGNOSIS — I481 Persistent atrial fibrillation: Secondary | ICD-10-CM

## 2015-05-04 DIAGNOSIS — I5022 Chronic systolic (congestive) heart failure: Secondary | ICD-10-CM | POA: Diagnosis not present

## 2015-05-04 DIAGNOSIS — I4819 Other persistent atrial fibrillation: Secondary | ICD-10-CM

## 2015-05-04 DIAGNOSIS — I2510 Atherosclerotic heart disease of native coronary artery without angina pectoris: Secondary | ICD-10-CM | POA: Insufficient documentation

## 2015-05-04 DIAGNOSIS — I482 Chronic atrial fibrillation, unspecified: Secondary | ICD-10-CM | POA: Insufficient documentation

## 2015-05-04 NOTE — Patient Instructions (Signed)
Medication Instructions:  Your physician has recommended you make the following change in your medication:  STOP aspirin   Labwork: BMET  Testing/Procedures: none  Follow-Up: Your physician recommends that you schedule a follow-up appointment in: one month with Dr. Kirke CorinArida.    Any Other Special Instructions Will Be Listed Below (If Applicable).     If you need a refill on your cardiac medications before your next appointment, please call your pharmacy.

## 2015-05-04 NOTE — Telephone Encounter (Signed)
Pt daughter calling stating pt mother is coming to see us today but wanted to let us know if we can please take a look at patients medication list.  She states since pt has been out of hospital she is not doing well, thinks the medications needs to be adjusted.  Also thinks that patient may need further care. Pt is stating to not understand a lot so she is asking if we have any other options to help her outside of nursing home.  Please advise.

## 2015-05-04 NOTE — Progress Notes (Signed)
Cardiology Office Note   Date:  05/04/2015   ID:  Kathy Lowery, DOB 1924-07-02, MRN [id number]  PCP:  Danella Penton, MD  Cardiologist:   Lorine Bears, MD   Chief Complaint  Patient presents with  . other    F/u heart cath no complaints. Meds reviewed verbally with pt.      History of Present Illness: Kathy Lowery is a 82 y.o. female who presents for  a post hospitalization follow-up visit. She has known history of chronic atrial fibrillation treated with rate control and warfarin anticoagulation. She presented recently to [facility] with shortness of breath and chest pain and was found to have mildly elevated troponin. She was found to be in heart failure and required diuresis. Echocardiogram showed an ejection fraction of 30-35% with wall motion abnormalities suggestive of an LAD infarct. I proceeded with cardiac catheterization via the right radial artery which showed significant two-vessel coronary artery disease affecting the right coronary artery and left circumflex with no significant disease affecting the LAD. She had wall motion abnormality suggestive of stress-induced cardiomyopathy. She was treated medically. Digoxin was discontinued due to bradycardia and she was continued on small dose Toprol. She improved with diuresis and was started on spironolactone. [facility] lives at [facility]. Her daughter Billee Cashing is her power of attorney. She lives in [place]. Since hospital discharge, the patient has been feeling weak overall but overall she denies chest pain or significant dyspnea. She reports significant improvement in leg edema with furosemide. She fell recently and sprained her left wrist. She went to the emergency room and there was no evidence of fractures.    Past Medical History  Diagnosis Date  . HTN (hypertension)   . Hyperlipidemia   . Peripheral neuralgia   . Arthritis   . MI (myocardial infarction) (HCC)   . Chronic atrial fibrillation (HCC)   .  Coronary artery disease     Non-ST elevation myocardial infarction in February 2017. Cardiac catheterization showed significant two-vessel coronary artery disease affecting the right coronary artery and left circumflex with no significant disease involving the LAD. Ejection fraction was 30-35% with akinesis of the mid to distal and apical segments consistent with stress-induced cardiomyopathy.    Past Surgical History  Procedure Laterality Date  . Appendectomy    . Lumbar spine surgery    . Cardiac catheterization N/A 04/23/2015    Procedure: Left Heart Cath and Coronary Angiography;  Surgeon: Iran Ouch, MD;  Location: [facility] INVASIVE CV LAB;  Service: Cardiovascular;  Laterality: N/A;     Current Outpatient Prescriptions  Medication Sig Dispense Refill  . acetaminophen-codeine (TYLENOL #3) 300-30 MG tablet Take 1 tablet by mouth every 4 (four) hours as needed for moderate pain. 30 tablet 0  . atorvastatin (LIPITOR) 40 MG tablet Take 1 tablet (40 mg total) by mouth daily at 6 PM. 30 tablet 0  . furosemide (LASIX) 40 MG tablet Take 1 tablet (40 mg total) by mouth daily. 30 tablet 0  . gemfibrozil (LOPID) 600 MG tablet Take 600 mg by mouth 2 (two) times daily before a meal.    . losartan (COZAAR) 50 MG tablet Take 1 tablet (50 mg total) by mouth daily. 30 tablet 0  . magnesium oxide (MAG-OX) 400 (241.3 Mg) MG tablet Take 1 tablet (400 mg total) by mouth 2 (two) times daily. 60 tablet 0  . metoprolol succinate (TOPROL-XL) 25 MG 24 hr tablet Take 0.5 tablets (12.5 mg total) by mouth daily. 30  tablet 0  . potassium chloride (K-DUR) 10 MEQ tablet Take 1 tablet (10 mEq total) by mouth daily. 30 tablet 0  . spironolactone (ALDACTONE) 25 MG tablet Take 0.5 tablets (12.5 mg total) by mouth daily. 30 tablet 0  . warfarin (COUMADIN) 2 MG tablet Take 2 mg by mouth daily.     No current facility-administered medications for this visit.    Allergies:   Review of patient's allergies indicates no  known allergies.    Social History:  The patient  reports that she has quit smoking. She does not have any smokeless tobacco history on file. She reports that she does not drink alcohol or use illicit drugs.   Family History:  The patient's family history includes CAD in her father; Hypertension in her father; Tuberculosis in her mother.    ROS:  Please see the history of present illness.   Otherwise, review of systems are positive for none.   All other systems are reviewed and negative.    PHYSICAL EXAM: VS:  BP 96/60 mmHg  Pulse 73  Ht 5' 3.5" (1.613 m)  Wt 189 lb (85.73 kg)  BMI 32.95 kg/m2 , BMI Body mass index is 32.95 kg/(m^2). GEN: Well nourished, well developed, in no acute distress HEENT: normal Neck: no JVD, carotid bruits, or masses Cardiac: Irregularly irregular; no murmurs, rubs, or gallops,no edema  Respiratory:  clear to auscultation bilaterally, normal work of breathing GI: soft, nontender, nondistended, + BS MS: no deformity or atrophy Skin: warm and dry, no rash Neuro:  Strength and sensation are intact Psych: euthymic mood, full affect Right radial pulse is normal with no hematoma  EKG:  EKG is ordered today. The ekg ordered today demonstrates : Atrial fibrillation with ventricular rate of 73 bpm. Left bundle branch block.   Recent Labs: 04/19/2015: ALT 10*; B Natriuretic Peptide 664.0* 04/21/2015: Magnesium 1.9 04/25/2015: BUN 22*; Creatinine, Ser 0.73; Hemoglobin 12.8; Platelets 211; Potassium 3.3*; Sodium 137    Lipid Panel No results found for: CHOL, TRIG, HDL, CHOLHDL, VLDL, LDLCALC, LDLDIRECT    Wt Readings from Last 3 Encounters:  05/04/15 189 lb (85.73 kg)  05/02/15 188 lb (85.276 kg)  04/25/15 189 lb 1.6 oz (85.775 kg)        ASSESSMENT AND PLAN:  1.  Chronic systolic heart failure: This was likely due to stress-induced cardiomyopathy. Continue small dose Toprol, losartan and spironolactone. She appears to be euvolemic on current dose of  furosemide. I requested basic metabolic profile today. I am hoping that LV systolic function will improve in the next few weeks.  2. Coronary artery disease involving native coronary arteries without angina: The patient was found to have significant 2 vessel coronary artery disease affecting the left circumflex and right coronary artery. However, these were not responsible for her recent myocardial infarction which was caused by stress-induced cardiomyopathy. Given her age and lack of anginal symptoms, I recommend continuing aggressive medical therapy. Given that she is on anticoagulation with warfarin, I stopped aspirin to minimize the risk of bleeding.  3. Chronic atrial fibrillation: Ventricular rate is well controlled on small dose Toprol. The patient does have underlying left bundle branch block and she is at high risk for regression for high-grade AV block requiring pacemaker. Thus, digoxin was recently discontinued. Continue anticoagulation with warfarin which is managed by Dr. Hyacinth MeekerMiller.      Disposition:   FU with me in 1 month  Signed, Lorine BearsMuhammad Gearlene Godsil, MD  05/04/2015 11:33 AM    [facility]

## 2015-05-04 NOTE — Telephone Encounter (Signed)
Noted  

## 2015-05-05 ENCOUNTER — Ambulatory Visit: Payer: Medicare Other | Attending: Family | Admitting: Family

## 2015-05-05 ENCOUNTER — Encounter: Payer: Self-pay | Admitting: Family

## 2015-05-05 ENCOUNTER — Other Ambulatory Visit: Payer: Self-pay

## 2015-05-05 VITALS — BP 126/61 | HR 62 | Resp 20 | Ht 63.0 in | Wt 187.0 lb

## 2015-05-05 DIAGNOSIS — I2510 Atherosclerotic heart disease of native coronary artery without angina pectoris: Secondary | ICD-10-CM | POA: Insufficient documentation

## 2015-05-05 DIAGNOSIS — I252 Old myocardial infarction: Secondary | ICD-10-CM | POA: Diagnosis not present

## 2015-05-05 DIAGNOSIS — I5022 Chronic systolic (congestive) heart failure: Secondary | ICD-10-CM | POA: Insufficient documentation

## 2015-05-05 DIAGNOSIS — I482 Chronic atrial fibrillation: Secondary | ICD-10-CM | POA: Diagnosis not present

## 2015-05-05 DIAGNOSIS — E785 Hyperlipidemia, unspecified: Secondary | ICD-10-CM | POA: Diagnosis not present

## 2015-05-05 DIAGNOSIS — M1990 Unspecified osteoarthritis, unspecified site: Secondary | ICD-10-CM | POA: Diagnosis not present

## 2015-05-05 DIAGNOSIS — I4891 Unspecified atrial fibrillation: Secondary | ICD-10-CM | POA: Insufficient documentation

## 2015-05-05 DIAGNOSIS — I10 Essential (primary) hypertension: Secondary | ICD-10-CM | POA: Insufficient documentation

## 2015-05-05 DIAGNOSIS — R002 Palpitations: Secondary | ICD-10-CM | POA: Diagnosis not present

## 2015-05-05 DIAGNOSIS — N179 Acute kidney failure, unspecified: Secondary | ICD-10-CM

## 2015-05-05 DIAGNOSIS — Z7901 Long term (current) use of anticoagulants: Secondary | ICD-10-CM | POA: Diagnosis not present

## 2015-05-05 DIAGNOSIS — Z9889 Other specified postprocedural states: Secondary | ICD-10-CM | POA: Insufficient documentation

## 2015-05-05 DIAGNOSIS — Z87891 Personal history of nicotine dependence: Secondary | ICD-10-CM | POA: Insufficient documentation

## 2015-05-05 DIAGNOSIS — I4819 Other persistent atrial fibrillation: Secondary | ICD-10-CM

## 2015-05-05 DIAGNOSIS — Z79899 Other long term (current) drug therapy: Secondary | ICD-10-CM | POA: Insufficient documentation

## 2015-05-05 DIAGNOSIS — Z8249 Family history of ischemic heart disease and other diseases of the circulatory system: Secondary | ICD-10-CM | POA: Diagnosis not present

## 2015-05-05 LAB — BASIC METABOLIC PANEL
BUN/Creatinine Ratio: 26 (ref 11–26)
BUN: 36 mg/dL — ABNORMAL HIGH (ref 8–27)
CO2: 21 mmol/L (ref 18–29)
CREATININE: 1.41 mg/dL — AB (ref 0.57–1.00)
Calcium: 9 mg/dL (ref 8.7–10.3)
Chloride: 94 mmol/L — ABNORMAL LOW (ref 96–106)
GFR calc Af Amer: 38 mL/min/{1.73_m2} — ABNORMAL LOW (ref >59)
GFR calc non Af Amer: 33 mL/min/{1.73_m2} — ABNORMAL LOW (ref >59)
GLUCOSE: 86 mg/dL (ref 65–99)
Potassium: 3.8 mmol/L (ref 3.5–5.2)
SODIUM: 136 mmol/L (ref 134–144)

## 2015-05-05 NOTE — Progress Notes (Signed)
Subjective:    Patient ID: Kathy Lowery, female    DOB: 13-Sep-1924, 82 y.o.   MRN: [id number]  Congestive Heart Failure Presents for initial visit. The disease course has been improving. Associated symptoms include edema, fatigue and palpitations (at times). Pertinent negatives include no abdominal pain, chest pain, chest pressure, orthopnea or shortness of breath. The symptoms have been improving. Past treatments include angiotensin receptor blockers, aldosterone receptor blockers, beta blockers and salt and fluid restriction. The treatment provided moderate relief. Compliance with prior treatments has been good. Her past medical history is significant for CAD and HTN. There is no history of CVA or DM. She has one 1st degree relative with heart disease. Compliance with total regimen is 51-75%. Compliance with exercise is 51-75%.  Hypertension This is a chronic problem. The current episode started more than 1 year ago. The problem is unchanged. The problem is controlled. Associated symptoms include palpitations (at times) and peripheral edema. Pertinent negatives include no chest pain, headaches or shortness of breath. There are no associated agents to hypertension. Risk factors for coronary artery disease include family history, dyslipidemia, post-menopausal state and sedentary lifestyle. Past treatments include beta blockers, angiotensin blockers, diuretics and lifestyle changes. The current treatment provides moderate improvement. Compliance problems include exercise.  Hypertensive end-organ damage includes CAD/MI and heart failure.   Past Medical History  Diagnosis Date  . HTN (hypertension)   . Hyperlipidemia   . Peripheral neuralgia   . Arthritis   . MI (myocardial infarction) (HCC)   . Chronic atrial fibrillation (HCC)   . Coronary artery disease     Non-ST elevation myocardial infarction in February 2017. Cardiac catheterization showed significant two-vessel coronary artery disease  affecting the right coronary artery and left circumflex with no significant disease involving the LAD. Ejection fraction was 30-35% with akinesis of the mid to distal and apical segments consistent with stress-induced cardiomyopathy.  . CHF (congestive heart failure) [facility])     Past Surgical History  Procedure Laterality Date  . Appendectomy    . Lumbar spine surgery    . Cardiac catheterization N/A 04/23/2015    Procedure: Left Heart Cath and Coronary Angiography;  Surgeon: Iran Ouch, MD;  Location: [facility] INVASIVE CV LAB;  Service: Cardiovascular;  Laterality: N/A;    Family History  Problem Relation Age of Onset  . CAD Father   . Hypertension Father   . Tuberculosis Mother     Social History  Substance Use Topics  . Smoking status: Former Smoker -- 16 years  . Smokeless tobacco: Never Used  . Alcohol Use: No    No Known Allergies  Prior to Admission medications   Medication Sig Start Date End Date Taking? Authorizing Provider  acetaminophen-codeine (TYLENOL #3) 300-30 MG tablet Take 1 tablet by mouth every 4 (four) hours as needed for moderate pain. 05/02/15  Yes Charmayne Sheer Beers, PA-C  atorvastatin (LIPITOR) 40 MG tablet Take 1 tablet (40 mg total) by mouth daily at 6 PM. 04/25/15  Yes Auburn Bilberry, MD  furosemide (LASIX) 40 MG tablet Take 1 tablet (40 mg total) by mouth daily. 04/25/15  Yes Auburn Bilberry, MD  gemfibrozil (LOPID) 600 MG tablet Take 600 mg by mouth 2 (two) times daily before a meal.   Yes Historical Provider, MD  losartan (COZAAR) 50 MG tablet Take 1 tablet (50 mg total) by mouth daily. 04/25/15  Yes Auburn Bilberry, MD  magnesium oxide (MAG-OX) 400 (241.3 Mg) MG tablet Take 1 tablet (400 mg total) by mouth  2 (two) times daily. 04/25/15  Yes Auburn BilberryShreyang Patel, MD  metoprolol succinate (TOPROL-XL) 25 MG 24 hr tablet Take 0.5 tablets (12.5 mg total) by mouth daily. 04/25/15  Yes Auburn BilberryShreyang Patel, MD  potassium chloride (K-DUR) 10 MEQ tablet Take 1 tablet (10 mEq total)  by mouth daily. 04/25/15  Yes Auburn BilberryShreyang Patel, MD  spironolactone (ALDACTONE) 25 MG tablet Take 0.5 tablets (12.5 mg total) by mouth daily. 04/25/15  Yes Auburn BilberryShreyang Patel, MD  warfarin (COUMADIN) 2 MG tablet Take 2 mg by mouth daily.   Yes Historical Provider, MD      Review of Systems  Constitutional: Positive for fatigue. Negative for appetite change.  HENT: Negative for congestion, postnasal drip and sore throat.   Eyes: Negative.   Respiratory: Positive for cough (dry cough). Negative for chest tightness, shortness of breath and wheezing.   Cardiovascular: Positive for palpitations (at times) and leg swelling. Negative for chest pain.  Gastrointestinal: Negative for abdominal pain and abdominal distention.  Endocrine: Negative.   Genitourinary: Negative.   Musculoskeletal: Positive for back pain (lower back) and arthralgias (left wrist injury).  Skin: Negative.   Allergic/Immunologic: Negative.   Neurological: Positive for light-headedness (if change positions too quickly). Negative for dizziness and headaches.  Hematological: Negative for adenopathy. Does not bruise/bleed easily.  Psychiatric/Behavioral: Negative for sleep disturbance (sleep in recliner or sleeping in the bed with 2 pillows under the head) and dysphoric mood. The patient is nervous/anxious (little bit today).        Objective:   Physical Exam  Constitutional: She is oriented to person, place, and time. She appears well-developed and well-nourished.  HENT:  Head: Normocephalic and atraumatic.  Eyes: Conjunctivae are normal. Pupils are equal, round, and reactive to light.  Neck: Normal range of motion. Neck supple.  Cardiovascular: An irregular rhythm present. Bradycardia present.   Pulmonary/Chest: Effort normal. She has no wheezes. She has no rales.  Abdominal: Soft. She exhibits no distension. There is no tenderness.  Musculoskeletal: She exhibits edema (1+ pitting edema in left lower leg). She exhibits no  tenderness.  Neurological: She is alert and oriented to person, place, and time.  Skin: Skin is warm and dry.  Psychiatric: She has a normal mood and affect. Her behavior is normal. Thought content normal.  Nursing note and vitals reviewed.   BP 126/61 mmHg  Pulse 62  Resp 20  Ht 5\' 3"  (1.6 m)  Wt 187 lb (84.823 kg)  BMI 33.13 kg/m2  SpO2 95%       Assessment & Plan:  1: Chronic heart failure with reduced ejection fraction- Patient presents with fatigue upon exertion. She says that when she gets tired, she will stop what she's doing to rest until her symptoms improve. She says that she will be starting physical therapy tomorrow. She is already weighing herself on a daily basis and she was instructed to call for an overnight weight gain of >2 pounds or a weekly weight gain of >5 pounds. She is not adding any salt to her food and already reads food labels closely. Written information regarding a 2000mg  sodium diet was given to her. She does elevate her legs during the day as she normally sits in a recliner. She is currently taking 40mg  furosemide daily with the option of taking additional for weight gain or symptom management. [facility] and reviewed medications with the patient. Could consider changing her cozaar to entresto at future visits. Doubt her metoprolol could be titrated due do her bradycardia.  2: HTN- Blood pressure looks good today. Continue medications. Has an appointment with her PCP on 05/19/15. 3: Atrial fibrillation- Currently rate controlled at this time. Takes toprol along with warfarin daily. Sees her cardiologist on 06/15/15.  Return here in 1 month or sooner for any questions/problems before then.

## 2015-05-05 NOTE — Patient Instructions (Signed)
Continue weighing daily and call for an overnight weight gain of > 2 pounds or a weekly weight gain of >5 pounds. 

## 2015-05-11 ENCOUNTER — Other Ambulatory Visit
Admission: RE | Admit: 2015-05-11 | Discharge: 2015-05-11 | Disposition: A | Discharge status: Home / Self Care | Payer: Medicare Other | Source: Ambulatory Visit | Attending: Cardiovascular Disease | Admitting: Cardiovascular Disease

## 2015-05-11 ENCOUNTER — Encounter: Payer: Self-pay | Admitting: Cardiovascular Disease

## 2015-05-11 DIAGNOSIS — N179 Acute kidney failure, unspecified: Secondary | ICD-10-CM | POA: Insufficient documentation

## 2015-05-11 LAB — BASIC METABOLIC PANEL
ANION GAP: 7 (ref 5–15)
BUN: 22 mg/dL — ABNORMAL HIGH (ref 6–20)
CHLORIDE: 107 mmol/L (ref 101–111)
CO2: 25 mmol/L (ref 22–32)
Calcium: 9.1 mg/dL (ref 8.9–10.3)
Creatinine, Ser: 0.86 mg/dL (ref 0.44–1.00)
GFR calc non Af Amer: 59 mL/min — ABNORMAL LOW (ref >60)
Glucose, Bld: 103 mg/dL — ABNORMAL HIGH (ref 65–99)
POTASSIUM: 3.9 mmol/L (ref 3.5–5.1)
Sodium: 139 mmol/L (ref 135–145)

## 2015-06-02 ENCOUNTER — Encounter: Payer: Self-pay | Admitting: Family

## 2015-06-02 ENCOUNTER — Ambulatory Visit: Payer: Medicare Other | Attending: Family | Admitting: Family

## 2015-06-02 VITALS — BP 123/58 | HR 77 | Resp 18 | Ht 63.0 in | Wt 184.0 lb

## 2015-06-02 DIAGNOSIS — Z7901 Long term (current) use of anticoagulants: Secondary | ICD-10-CM | POA: Diagnosis not present

## 2015-06-02 DIAGNOSIS — Z9889 Other specified postprocedural states: Secondary | ICD-10-CM | POA: Diagnosis not present

## 2015-06-02 DIAGNOSIS — I482 Chronic atrial fibrillation, unspecified: Secondary | ICD-10-CM

## 2015-06-02 DIAGNOSIS — Z79899 Other long term (current) drug therapy: Secondary | ICD-10-CM | POA: Diagnosis not present

## 2015-06-02 DIAGNOSIS — M1990 Unspecified osteoarthritis, unspecified site: Secondary | ICD-10-CM | POA: Diagnosis not present

## 2015-06-02 DIAGNOSIS — I10 Essential (primary) hypertension: Secondary | ICD-10-CM | POA: Diagnosis not present

## 2015-06-02 DIAGNOSIS — R5383 Other fatigue: Secondary | ICD-10-CM | POA: Diagnosis not present

## 2015-06-02 DIAGNOSIS — I5022 Chronic systolic (congestive) heart failure: Secondary | ICD-10-CM | POA: Diagnosis present

## 2015-06-02 DIAGNOSIS — I2510 Atherosclerotic heart disease of native coronary artery without angina pectoris: Secondary | ICD-10-CM | POA: Insufficient documentation

## 2015-06-02 DIAGNOSIS — Z8249 Family history of ischemic heart disease and other diseases of the circulatory system: Secondary | ICD-10-CM | POA: Insufficient documentation

## 2015-06-02 DIAGNOSIS — Z87891 Personal history of nicotine dependence: Secondary | ICD-10-CM | POA: Diagnosis not present

## 2015-06-02 DIAGNOSIS — I252 Old myocardial infarction: Secondary | ICD-10-CM | POA: Insufficient documentation

## 2015-06-02 DIAGNOSIS — E785 Hyperlipidemia, unspecified: Secondary | ICD-10-CM | POA: Diagnosis not present

## 2015-06-02 NOTE — Patient Instructions (Signed)
Continue weighing daily and call for an overnight weight gain of > 2 pounds or a weekly weight gain of >5 pounds. 

## 2015-06-02 NOTE — Progress Notes (Signed)
Subjective:    Patient ID: Kathy Lowery, female    DOB: 1924-04-12, 82 y.o.   MRN: [id number]  Congestive Heart Failure Presents for follow-up visit. The disease course has been improving. Associated symptoms include edema and fatigue. Pertinent negatives include no abdominal pain, chest pain, chest pressure, muscle weakness, orthopnea, palpitations or shortness of breath. The symptoms have been improving. Past treatments include angiotensin receptor blockers, aldosterone receptor blockers, beta blockers and salt and fluid restriction. The treatment provided moderate relief. Compliance with prior treatments has been good. Her past medical history is significant for arrhythmia, CAD and HTN. There is no history of CVA or DM. She has one 1st degree relative with heart disease.  Hypertension This is a chronic problem. The current episode started more than 1 year ago. The problem is unchanged. The problem is controlled. Associated symptoms include peripheral edema. Pertinent negatives include no chest pain, headaches, neck pain, palpitations or shortness of breath. There are no associated agents to hypertension. Risk factors for coronary artery disease include dyslipidemia, family history, post-menopausal state and sedentary lifestyle. Past treatments include angiotensin blockers, beta blockers, diuretics and lifestyle changes. The current treatment provides significant improvement. Compliance problems include exercise.  Hypertensive end-organ damage includes CAD/MI and heart failure.    Past Medical History  Diagnosis Date  . HTN (hypertension)   . Hyperlipidemia   . Peripheral neuralgia   . Arthritis   . MI (myocardial infarction) (HCC)   . Chronic atrial fibrillation (HCC)   . Coronary artery disease     Non-ST elevation myocardial infarction in February 2017. Cardiac catheterization showed significant two-vessel coronary artery disease affecting the right coronary artery and left circumflex with  no significant disease involving the LAD. Ejection fraction was 30-35% with akinesis of the mid to distal and apical segments consistent with stress-induced cardiomyopathy.  . CHF (congestive heart failure) [facility])     Past Surgical History  Procedure Laterality Date  . Appendectomy    . Lumbar spine surgery    . Cardiac catheterization N/A 04/23/2015    Procedure: Left Heart Cath and Coronary Angiography;  Surgeon: Iran Ouch, MD;  Location: [facility] INVASIVE CV LAB;  Service: Cardiovascular;  Laterality: N/A;    Family History  Problem Relation Age of Onset  . CAD Father   . Hypertension Father   . Tuberculosis Mother     Social History  Substance Use Topics  . Smoking status: Former Smoker -- 16 years  . Smokeless tobacco: Never Used  . Alcohol Use: No    No Known Allergies  Prior to Admission medications   Medication Sig Start Date End Date Taking? Authorizing Provider  acetaminophen-codeine (TYLENOL #3) 300-30 MG tablet Take 1 tablet by mouth every 4 (four) hours as needed for moderate pain. 05/02/15  Yes Charmayne Sheer Beers, PA-C  atorvastatin (LIPITOR) 40 MG tablet Take 1 tablet (40 mg total) by mouth daily at 6 PM. 04/25/15  Yes Auburn Bilberry, MD  furosemide (LASIX) 40 MG tablet Take 1 tablet (40 mg total) by mouth daily. 04/25/15  Yes Auburn Bilberry, MD  gemfibrozil (LOPID) 600 MG tablet Take 600 mg by mouth 2 (two) times daily before a meal.   Yes Historical Provider, MD  losartan (COZAAR) 50 MG tablet Take 1 tablet (50 mg total) by mouth daily. 04/25/15  Yes Auburn Bilberry, MD  magnesium oxide (MAG-OX) 400 (241.3 Mg) MG tablet Take 1 tablet (400 mg total) by mouth 2 (two) times daily. 04/25/15  Yes Auburn Bilberry, MD  metoprolol succinate (TOPROL-XL) 25 MG 24 hr tablet Take 0.5 tablets (12.5 mg total) by mouth daily. 04/25/15  Yes Auburn Bilberry, MD  potassium chloride (K-DUR) 10 MEQ tablet Take 1 tablet (10 mEq total) by mouth daily. 04/25/15  Yes Auburn Bilberry, MD   spironolactone (ALDACTONE) 25 MG tablet Take 0.5 tablets (12.5 mg total) by mouth daily. 04/25/15  Yes Auburn Bilberry, MD  warfarin (COUMADIN) 2 MG tablet Take 2 mg by mouth daily.   Yes Historical Provider, MD     Review of Systems  Constitutional: Positive for fatigue. Negative for appetite change.  HENT: Positive for congestion. Negative for postnasal drip and sore throat.   Eyes: Negative.   Respiratory: Negative for cough, chest tightness, shortness of breath and wheezing.   Cardiovascular: Positive for leg swelling. Negative for chest pain and palpitations.  Gastrointestinal: Negative for abdominal pain and abdominal distention.  Endocrine: Negative.   Genitourinary: Positive for frequency. Negative for dysuria.  Musculoskeletal: Positive for arthralgias (left wrist at times). Negative for back pain, muscle weakness and neck pain.  Skin: Negative.   Allergic/Immunologic: Negative.   Neurological: Positive for light-headedness (if doesn't eat with medications). Negative for dizziness and headaches.  Hematological: Negative for adenopathy. Does not bruise/bleed easily.  Psychiatric/Behavioral: Positive for sleep disturbance (waking up often to urinate). Negative for dysphoric mood. The patient is not nervous/anxious.        Objective:   Physical Exam  Constitutional: She is oriented to person, place, and time. She appears well-developed and well-nourished.  HENT:  Head: Normocephalic and atraumatic.  Eyes: Conjunctivae are normal. Pupils are equal, round, and reactive to light.  Neck: Normal range of motion. Neck supple.  Cardiovascular: Normal rate.  An irregular rhythm present.  No murmur heard. Pulmonary/Chest: Effort normal. She has no wheezes. She has no rales.  Abdominal: Soft. She exhibits no distension. There is no tenderness.  Musculoskeletal: She exhibits edema (trace pitting edema in bilateral lower legs). She exhibits no tenderness.  Neurological: She is alert and  oriented to person, place, and time.  Skin: Skin is warm and dry.  Psychiatric: She has a normal mood and affect. Her behavior is normal. Thought content normal.  Nursing note and vitals reviewed.   BP 123/58 mmHg  Pulse 77  Resp 18  Ht  (1.6 m)  Wt 184 lb (83.462 kg)  BMI 32.60 kg/m2  SpO2 95%       Assessment & Plan:  1: Chronic heart failure with reduced ejection fraction- Patient presents with fatigue upon exertion along with some chronic edema in her lower legs. When she does get tired, she will stop what she's doing to rest until her energy level improves. She just finished receiving physical therapy at home and has the exercises for her to continue at home. She does elevate her legs when she's sitting for long periods of time. She continues to weigh herself and says that her home weight has been stable. By our scale, she's lost 3 pounds since she was here last. Reminded her to call for an overnight weight gain of >2 pounds or a weekly weight gain of >5 pounds. She is not adding any salt to her food and tries to eat low sodium foods. Discussed changing her cozaar to entresto but patient would like to discuss with her cardiologist first. Written information given to her about this and possible benefits of switching but also told patient that it would probably cost more than the cozaar. She sees her  cardiologist on 06/15/15. 2: Atrial fibrillation- Currently rate controlled at this time with metoprolol along with taking warfarin.  3: HTN- Blood pressure looks good. She sees her PCP towards the end of May 2017.  Return here in 3 months or sooner for any questions/problems before the next office visit.

## 2015-06-05 ENCOUNTER — Encounter: Payer: Self-pay | Admitting: Cardiovascular Disease

## 2015-06-05 ENCOUNTER — Encounter: Payer: Self-pay | Admitting: Family

## 2015-06-15 ENCOUNTER — Ambulatory Visit (INDEPENDENT_AMBULATORY_CARE_PROVIDER_SITE_OTHER): Payer: Medicare Other | Admitting: Cardiovascular Disease

## 2015-06-15 ENCOUNTER — Telehealth: Payer: Self-pay | Admitting: Cardiovascular Disease

## 2015-06-15 ENCOUNTER — Encounter: Payer: Self-pay | Admitting: Cardiovascular Disease

## 2015-06-15 VITALS — BP 180/90 | HR 75 | Ht 63.5 in | Wt 186.5 lb

## 2015-06-15 DIAGNOSIS — I482 Chronic atrial fibrillation, unspecified: Secondary | ICD-10-CM

## 2015-06-15 MED ORDER — SPIRONOLACTONE 25 MG PO TABS: 25.0000 mg | ORAL_TABLET | Freq: Every day | ORAL | Status: DC

## 2015-06-15 MED ORDER — METOPROLOL SUCCINATE ER 25 MG PO TB24: 25.0000 mg | ORAL_TABLET | Freq: Every day | ORAL | Status: DC

## 2015-06-15 NOTE — Telephone Encounter (Signed)
Pt requests I contact daughter, [name], to review medication changes and appointments.  S/w [name] to review. She verbalized understanding and asks to speak to scheduling to change f/u appt to earlier time. Pt has home assistance until 4:30pm each day. Appt w/Dr. Kirke CorinArida scheduled for 3:45pm. Daughter is appreciative of the call. Transferred to scheduling.

## 2015-06-15 NOTE — Progress Notes (Signed)
Cardiology Office Note   Date:  06/15/2015   ID:  Kathy Lowery, DOB 30-May-1924, MRN [id number]  PCP:  Danella Penton, MD  Cardiologist:   Lorine Bears, MD   Chief Complaint  Patient presents with  . other    1 month follow up. Meds reviewed by the patient's bottles. "doing well."      History of Present Illness: Kathy Lowery is a 82 y.o. female who presents for a follow-up visit regarding chronic systolic heart failure felt to be due to stress-induced cardiomyopathy and chronic atrial fibrillation.   She was hospitalized in February for heart failure and non-ST elevation myocardial infarction.Echocardiogram showed an ejection fraction of 30-35% with wall motion abnormalities suggestive of an LAD infarct. I proceeded with cardiac catheterization which showed significant two-vessel coronary artery disease affecting the right coronary artery and left circumflex with no significant disease affecting the LAD. She had wall motion abnormality suggestive of stress-induced cardiomyopathy. She was treated medically. Digoxin was discontinued due to bradycardia and she was continued on small dose Toprol. She improved with diuresis and was started on spironolactone. [facility] at [facility]. Her daughter [name] is her power of attorney. She lives in [place]. Furosemide was discontinued after her labs showed worsening kidney function. Follow-up labs showed that the creatinine was back to baseline. She reports getting better every day with increased strength. No chest pain. Shortness of breath is improved. Her weight has been stable and she has not gained any weight since her last visit.    Past Medical History  Diagnosis Date  . HTN (hypertension)   . Hyperlipidemia   . Peripheral neuralgia   . Arthritis   . MI (myocardial infarction) (HCC)   . Chronic atrial fibrillation (HCC)   . Coronary artery disease     Non-ST elevation myocardial infarction in February 2017.  Cardiac catheterization showed significant two-vessel coronary artery disease affecting the right coronary artery and left circumflex with no significant disease involving the LAD. Ejection fraction was 30-35% with akinesis of the mid to distal and apical segments consistent with stress-induced cardiomyopathy.  . CHF (congestive heart failure) [facility])     Past Surgical History  Procedure Laterality Date  . Appendectomy    . Lumbar spine surgery    . Cardiac catheterization N/A 04/23/2015    Procedure: Left Heart Cath and Coronary Angiography;  Surgeon: Iran Ouch, MD;  Location: [facility] INVASIVE CV LAB;  Service: Cardiovascular;  Laterality: N/A;     Current Outpatient Prescriptions  Medication Sig Dispense Refill  . acetaminophen-codeine (TYLENOL #3) 300-30 MG tablet Take 1 tablet by mouth every 4 (four) hours as needed for moderate pain. 30 tablet 0  . atorvastatin (LIPITOR) 40 MG tablet Take 1 tablet (40 mg total) by mouth daily at 6 PM. 30 tablet 0  . gemfibrozil (LOPID) 600 MG tablet Take 600 mg by mouth 2 (two) times daily before a meal.    . losartan (COZAAR) 50 MG tablet Take 1 tablet (50 mg total) by mouth daily. 30 tablet 0  . magnesium oxide (MAG-OX) 400 (241.3 Mg) MG tablet Take 1 tablet (400 mg total) by mouth 2 (two) times daily. 60 tablet 0  . metoprolol succinate (TOPROL-XL) 25 MG 24 hr tablet Take 0.5 tablets (12.5 mg total) by mouth daily. 30 tablet 0  . potassium chloride (K-DUR) 10 MEQ tablet Take 1 tablet (10 mEq total) by mouth daily. 30 tablet 0  . spironolactone (ALDACTONE) 25 MG tablet  Take 0.5 tablets (12.5 mg total) by mouth daily. 30 tablet 0  . warfarin (COUMADIN) 2 MG tablet Take 2 mg by mouth daily.    . furosemide (LASIX) 40 MG tablet Take 1 tablet (40 mg total) by mouth daily. (Patient not taking: Reported on 06/15/2015) 30 tablet 0   No current facility-administered medications for this visit.    Allergies:   Review of patient's allergies indicates no  known allergies.    Social History:  The patient  reports that she has quit smoking. She has never used smokeless tobacco. She reports that she does not drink alcohol or use illicit drugs.   Family History:  The patient's family history includes CAD in her father; Hypertension in her father; Tuberculosis in her mother.    ROS:  Please see the history of present illness.   Otherwise, review of systems are positive for none.   All other systems are reviewed and negative.    PHYSICAL EXAM: VS:  BP 180/90 mmHg  Pulse 75  Ht 5' 3.5" (1.613 m)  Wt 186 lb 8 oz (84.596 kg)  BMI 32.51 kg/m2 , BMI Body mass index is 32.51 kg/(m^2). GEN: Well nourished, well developed, in no acute distress HEENT: normal Neck: no JVD, carotid bruits, or masses Cardiac: Irregularly irregular; no murmurs, rubs, or gallops. +1 edema slightly worse on the right side with some redness Respiratory:  clear to auscultation bilaterally, normal work of breathing GI: soft, nontender, nondistended, + BS MS: no deformity or atrophy Skin: warm and dry, no rash Neuro:  Strength and sensation are intact Psych: euthymic mood, full affect   EKG:  EKG is ordered today. The ekg ordered today demonstrates : Atrial fibrillation with ventricular rate of 75 bpm. Left bundle branch block.   Recent Labs: 04/19/2015: ALT 10*; B Natriuretic Peptide 664.0* 04/21/2015: Magnesium 1.9 04/25/2015: Hemoglobin 12.8; Platelets 211 05/11/2015: BUN 22*; Creatinine, Ser 0.86; Potassium 3.9; Sodium 139    Lipid Panel No results found for: CHOL, TRIG, HDL, CHOLHDL, VLDL, LDLCALC, LDLDIRECT    Wt Readings from Last 3 Encounters:  06/15/15 186 lb 8 oz (84.596 kg)  06/02/15 184 lb (83.462 kg)  05/05/15 187 lb (84.823 kg)        ASSESSMENT AND PLAN:  1.  Chronic systolic heart failure: This was likely due to stress-induced cardiomyopathy.  She is hypertensive today but she reports being stressed because she was late to her appointment.  Blood pressure is usually not this time. I elected to increase the dose of Toprol to 25 mg once daily and spironolactone 25 mg once daily. Continue losartan. I discontinued potassium supplement. Furosemide was discontinued due to worsening kidney function. She has not shown significant fluid overload since then. Her weight has been around 185 pounds. Check basic metabolic profile in one week  2. Coronary artery disease involving native coronary arteries without angina: The patient was found to have significant 2 vessel coronary artery disease affecting the left circumflex and right coronary artery. However, these were not responsible for her recent myocardial infarction which was caused by stress-induced cardiomyopathy. Given her age and lack of anginal symptoms, I recommend continuing aggressive medical therapy.   3. Chronic atrial fibrillation: Ventricular rate is well controlled on small dose Toprol. The patient does have underlying left bundle branch block . Continue anticoagulation with warfarin which is managed by Dr. Hyacinth MeekerMiller.   Disposition:   FU with me in 1 month  Signed,  Lorine BearsMuhammad Arida, MD  06/15/2015 11:07 AM  [facility]

## 2015-06-15 NOTE — Patient Instructions (Signed)
Medication Instructions:  Your physician has recommended you make the following change in your medication:  STOP taking lasix STOP taking potassium INCREASE metoprolol to 25mg  once daily (take one pill daily) INCREASE spironolactone to 25 mg once daily (take one pill daily)    Labwork: BMET in one week  Testing/Procedures: none  Follow-Up: Your physician recommends that you schedule a follow-up appointment in: 1 month with Dr. Kirke CorinArida.    Any Other Special Instructions Will Be Listed Below (If Applicable).     If you need a refill on your cardiac medications before your next appointment, please call your pharmacy.

## 2015-06-18 ENCOUNTER — Emergency Department: Payer: Medicare Other

## 2015-06-18 ENCOUNTER — Emergency Department
Admission: EM | Admit: 2015-06-18 | Discharge: 2015-06-19 | Disposition: A | Discharge status: Home / Self Care | Payer: Medicare Other | Attending: Emergency Medicine | Admitting: Emergency Medicine

## 2015-06-18 DIAGNOSIS — I509 Heart failure, unspecified: Secondary | ICD-10-CM | POA: Insufficient documentation

## 2015-06-18 DIAGNOSIS — M1990 Unspecified osteoarthritis, unspecified site: Secondary | ICD-10-CM | POA: Insufficient documentation

## 2015-06-18 DIAGNOSIS — I482 Chronic atrial fibrillation: Secondary | ICD-10-CM | POA: Insufficient documentation

## 2015-06-18 DIAGNOSIS — S90921A Unspecified superficial injury of right foot, initial encounter: Secondary | ICD-10-CM | POA: Diagnosis present

## 2015-06-18 DIAGNOSIS — I2510 Atherosclerotic heart disease of native coronary artery without angina pectoris: Secondary | ICD-10-CM | POA: Insufficient documentation

## 2015-06-18 DIAGNOSIS — Z87891 Personal history of nicotine dependence: Secondary | ICD-10-CM | POA: Insufficient documentation

## 2015-06-18 DIAGNOSIS — I110 Hypertensive heart disease with heart failure: Secondary | ICD-10-CM | POA: Insufficient documentation

## 2015-06-18 DIAGNOSIS — X501XXA Overexertion from prolonged static or awkward postures, initial encounter: Secondary | ICD-10-CM | POA: Diagnosis not present

## 2015-06-18 DIAGNOSIS — Y929 Unspecified place or not applicable: Secondary | ICD-10-CM | POA: Insufficient documentation

## 2015-06-18 DIAGNOSIS — Y999 Unspecified external cause status: Secondary | ICD-10-CM | POA: Insufficient documentation

## 2015-06-18 DIAGNOSIS — Z7901 Long term (current) use of anticoagulants: Secondary | ICD-10-CM | POA: Insufficient documentation

## 2015-06-18 DIAGNOSIS — S93401A Sprain of unspecified ligament of right ankle, initial encounter: Secondary | ICD-10-CM | POA: Insufficient documentation

## 2015-06-18 DIAGNOSIS — Y9301 Activity, walking, marching and hiking: Secondary | ICD-10-CM | POA: Insufficient documentation

## 2015-06-18 DIAGNOSIS — I252 Old myocardial infarction: Secondary | ICD-10-CM | POA: Diagnosis not present

## 2015-06-18 NOTE — ED Notes (Signed)
Pt in with co right foot pain states twisted it a few days ago, co persistent pain.

## 2015-06-19 MED ORDER — OXYCODONE-ACETAMINOPHEN 5-325 MG PO TABS: 1.0000 | ORAL_TABLET | Freq: Once | ORAL | Status: DC

## 2015-06-19 MED ORDER — OXYCODONE-ACETAMINOPHEN 5-325 MG PO TABS
1.0000 | ORAL_TABLET | Freq: Once | ORAL | Status: AC
Start: 2015-06-19 — End: 2015-06-19
  Administered 2015-06-19: 1 via ORAL
  Filled 2015-06-19: qty 1

## 2015-06-19 NOTE — Discharge Instructions (Signed)
Ankle Sprain  An ankle sprain is an injury to the strong, fibrous tissues (ligaments) that hold the bones of your ankle joint together.   CAUSES  An ankle sprain is usually caused by a fall or by twisting your ankle. Ankle sprains most commonly occur when you step on the outer edge of your foot, and your ankle turns inward. People who participate in sports are more prone to these types of injuries.   SYMPTOMS    Pain in your ankle. The pain may be present at rest or only when you are trying to stand or walk.   Swelling.   Bruising. Bruising may develop immediately or within 1 to 2 days after your injury.   Difficulty standing or walking, particularly when turning corners or changing directions.  DIAGNOSIS   Your caregiver will ask you details about your injury and perform a physical exam of your ankle to determine if you have an ankle sprain. During the physical exam, your caregiver will press on and apply pressure to specific areas of your foot and ankle. Your caregiver will try to move your ankle in certain ways. An X-ray exam may be done to be sure a bone was not broken or a ligament did not separate from one of the bones in your ankle (avulsion fracture).   TREATMENT   Certain types of braces can help stabilize your ankle. Your caregiver can make a recommendation for this. Your caregiver may recommend the use of medicine for pain. If your sprain is severe, your caregiver may refer you to a surgeon who helps to restore function to parts of your skeletal system (orthopedist) or a physical therapist.  HOME CARE INSTRUCTIONS    Apply ice to your injury for 1-2 days or as directed by your caregiver. Applying ice helps to reduce inflammation and pain.    Put ice in a plastic bag.    Place a towel between your skin and the bag.    Leave the ice on for 15-20 minutes at a time, every 2 hours while you are awake.   Only take over-the-counter or prescription medicines for pain, discomfort, or fever as directed by  your caregiver.   Elevate your injured ankle above the level of your heart as much as possible for 2-3 days.   If your caregiver recommends crutches, use them as instructed. Gradually put weight on the affected ankle. Continue to use crutches or a cane until you can walk without feeling pain in your ankle.   If you have a plaster splint, wear the splint as directed by your caregiver. Do not rest it on anything harder than a pillow for the first 24 hours. Do not put weight on it. Do not get it wet. You may take it off to take a shower or bath.   You may have been given an elastic bandage to wear around your ankle to provide support. If the elastic bandage is too tight (you have numbness or tingling in your foot or your foot becomes cold and blue), adjust the bandage to make it comfortable.   If you have an air splint, you may blow more air into it or let air out to make it more comfortable. You may take your splint off at night and before taking a shower or bath. Wiggle your toes in the splint several times per day to decrease swelling.  SEEK MEDICAL CARE IF:    You have rapidly increasing bruising or swelling.   Your toes feel   extremely cold or you lose feeling in your foot.   Your pain is not relieved with medicine.  SEEK IMMEDIATE MEDICAL CARE IF:   Your toes are numb or blue.   You have severe pain that is increasing.  MAKE SURE YOU:    Understand these instructions.   Will watch your condition.   Will get help right away if you are not doing well or get worse.     This information is not intended to replace advice given to you by your health care provider. Make sure you discuss any questions you have with your health care provider.     Document Released: 02/14/2005 Document Revised: 03/07/2014 Document Reviewed: 02/26/2011  [employer] Interactive Patient Education 2016 [employer]

## 2015-06-19 NOTE — ED Provider Notes (Signed)
[facility] Emergency Department Provider Note  ____________________________________________  Time seen: 12:45 AM  I have reviewed the triage vital signs and the nursing notes.   HISTORY  Chief Complaint Foot Injury     HPI Kathy Lowery is a 82 y.o. female history of axillary rolling her ankle yesterday while walking with her walker area patient states she currently have 9 out of 10 right ankle pain and swelling associated with it.     Past Medical History  Diagnosis Date  . HTN (hypertension)   . Hyperlipidemia   . Peripheral neuralgia   . Arthritis   . MI (myocardial infarction) (HCC)   . Chronic atrial fibrillation (HCC)   . Coronary artery disease     Non-ST elevation myocardial infarction in February 2017. Cardiac catheterization showed significant two-vessel coronary artery disease affecting the right coronary artery and left circumflex with no significant disease involving the LAD. Ejection fraction was 30-35% with akinesis of the mid to distal and apical segments consistent with stress-induced cardiomyopathy.  . CHF (congestive heart failure) [facility](HCC)     Patient Active Problem List   Diagnosis Date Noted  . Chronic atrial fibrillation (HCC)   . Coronary artery disease   . NSTEMI (non-ST elevated myocardial infarction) (HCC) 04/22/2015  . Current use of long term anticoagulation 04/22/2015  . Essential hypertension 04/22/2015  . Obesity (BMI 30-39.9) 04/22/2015  . Hypokalemia 04/22/2015  . CHF (congestive heart failure) (HCC) 04/19/2015    Past Surgical History  Procedure Laterality Date  . Appendectomy    . Lumbar spine surgery    . Cardiac catheterization N/A 04/23/2015    Procedure: Left Heart Cath and Coronary Angiography;  Surgeon: Iran OuchMuhammad A Arida, MD;  Location: [facility] INVASIVE CV LAB;  Service: Cardiovascular;  Laterality: N/A;    Current Outpatient Rx  Name  Route  Sig  Dispense  Refill  . acetaminophen-codeine (TYLENOL #3)  300-30 MG tablet   Oral   Take 1 tablet by mouth every 4 (four) hours as needed for moderate pain.   30 tablet   0   . atorvastatin (LIPITOR) 40 MG tablet   Oral   Take 1 tablet (40 mg total) by mouth daily at 6 PM.   30 tablet   0   . gemfibrozil (LOPID) 600 MG tablet   Oral   Take 600 mg by mouth 2 (two) times daily before a meal.         . losartan (COZAAR) 50 MG tablet   Oral   Take 1 tablet (50 mg total) by mouth daily.   30 tablet   0   . magnesium oxide (MAG-OX) 400 (241.3 Mg) MG tablet   Oral   Take 1 tablet (400 mg total) by mouth 2 (two) times daily.   60 tablet   0   . metoprolol succinate (TOPROL-XL) 25 MG 24 hr tablet   Oral   Take 1 tablet (25 mg total) by mouth daily.   30 tablet   3   . spironolactone (ALDACTONE) 25 MG tablet   Oral   Take 1 tablet (25 mg total) by mouth daily.   30 tablet   3   . warfarin (COUMADIN) 2 MG tablet   Oral   Take 2 mg by mouth daily.           Allergies No known drug allergies  Family History  Problem Relation Age of Onset  . CAD Father   . Hypertension Father   .  Tuberculosis Mother     Social History Social History  Substance Use Topics  . Smoking status: Former Smoker -- 16 years  . Smokeless tobacco: Never Used  . Alcohol Use: No    Review of Systems  Constitutional: Negative for fever. Eyes: Negative for visual changes. ENT: Negative for sore throat. Cardiovascular: Negative for chest pain. Respiratory: Negative for shortness of breath. Gastrointestinal: Negative for abdominal pain, vomiting and diarrhea. Genitourinary: Negative for dysuria. Musculoskeletal: Negative for back pain.Positive for right ankle pain and swelling Skin: Negative for rash. Neurological: Negative for headaches, focal weakness or numbness.   10-point ROS otherwise negative.  ____________________________________________   PHYSICAL EXAM:  VITAL SIGNS: ED Triage Vitals  Enc Vitals Group     BP 06/18/15  2303 159/89 mmHg     Pulse Rate 06/18/15 2303 100     Resp 06/18/15 2303 18     Temp 06/18/15 2303 98.3 F (36.8 C)     Temp Source 06/18/15 2303 Oral     SpO2 06/18/15 2303 95 %     Weight 06/18/15 2303 185 lb (83.915 kg)     Height 06/18/15 2303  (1.6 m)     Head Cir --      Peak Flow --      Pain Score 06/18/15 2304 8     Pain Loc --      Pain Edu? --      Excl. in GC? --    Constitutional: Alert and oriented. Well appearing and in no distress. Eyes: Conjunctivae are normal. PERRL. Normal extraocular movements. ENT   Head: Normocephalic and atraumatic.   Nose: No congestion/rhinnorhea.   Mouth/Throat: Mucous membranes are moist.   Neck: No stridor. Hematological/Lymphatic/Immunilogical: No cervical lymphadenopathy. Cardiovascular: Normal rate, regular rhythm. Normal and symmetric distal pulses are present in all extremities. No murmurs, rubs, or gallops. Respiratory: Normal respiratory effort without tachypnea nor retractions. Breath sounds are clear and equal bilaterally. No wheezes/rales/rhonchi. Gastrointestinal: Soft and nontender. No distention. There is no CVA tenderness. Genitourinary: deferred Musculoskeletal: Pain with active and passive range of motion of the right ankle. Tenderness to palpation and swelling noted of the lateral malleoli. Neurologic:  Normal speech and language. No gross focal neurologic deficits are appreciated. Speech is normal.  Skin:  Skin is warm, dry and intact. No rash noted. Psychiatric: Mood and affect are normal. Speech and behavior are normal. Patient exhibits appropriate insight and judgment.      RADIOLOGY  DG Foot Complete Right (Final result) Result time: 06/18/15 23:30:40   Final result by Rad Results In Interface (06/18/15 23:30:40)   Narrative:   CLINICAL DATA: Right foot pain after twisting injury a few days ago.  EXAM: RIGHT FOOT COMPLETE - 3+ VIEW  COMPARISON: 07/11/2011  FINDINGS: Diffuse bone  demineralization. Right foot appears otherwise intact. No evidence of acute fracture or subluxation. No focal bone lesion or bone destruction. Bone cortex and trabecular architecture appear intact. Soft tissue swelling over the dorsum of the right foot and right ankle. Vascular calcifications. No radiopaque soft tissue foreign bodies.  IMPRESSION: No acute bony abnormalities. Dorsal soft tissue swelling over the right foot and ankle.   Electronically Signed By: Burman Nieves M.D. On: 06/18/2015 23:30           INITIAL IMPRESSION / ASSESSMENT AND PLAN / ED COURSE  Pertinent labs & imaging results that were available during my care of the patient were reviewed by me and considered in my medical decision making (see chart  for details).  Ankle stirrup applied  ____________________________________________   FINAL CLINICAL IMPRESSION(S) / ED DIAGNOSES  Final diagnoses:  Right ankle sprain, initial encounter      Darci Current, MD 06/19/15 [phone]

## 2015-06-23 ENCOUNTER — Telehealth: Payer: Self-pay | Admitting: Cardiovascular Disease

## 2015-06-23 ENCOUNTER — Other Ambulatory Visit
Admission: RE | Admit: 2015-06-23 | Discharge: 2015-06-23 | Disposition: A | Discharge status: Home / Self Care | Payer: Medicare Other | Source: Ambulatory Visit | Attending: Cardiovascular Disease | Admitting: Cardiovascular Disease

## 2015-06-23 ENCOUNTER — Other Ambulatory Visit: Payer: Medicare Other

## 2015-06-23 DIAGNOSIS — I482 Chronic atrial fibrillation: Secondary | ICD-10-CM | POA: Diagnosis present

## 2015-06-23 LAB — BASIC METABOLIC PANEL
ANION GAP: 9 (ref 5–15)
BUN: 21 mg/dL — ABNORMAL HIGH (ref 6–20)
CO2: 23 mmol/L (ref 22–32)
Calcium: 9.5 mg/dL (ref 8.9–10.3)
Chloride: 106 mmol/L (ref 101–111)
Creatinine, Ser: 0.87 mg/dL (ref 0.44–1.00)
GFR calc Af Amer: >60 mL/min (ref >60)
GFR, EST NON AFRICAN AMERICAN: 57 mL/min — AB (ref >60)
GLUCOSE: 93 mg/dL (ref 65–99)
POTASSIUM: 4.2 mmol/L (ref 3.5–5.1)
Sodium: 138 mmol/L (ref 135–145)

## 2015-06-23 NOTE — Telephone Encounter (Signed)
Pt [facility] today [facility]. Results in MD basket awaiting review. Left message for [name], [place] ([place]

## 2015-06-23 NOTE — Telephone Encounter (Signed)
[facility] Pt and another female accompanying her were in the HF Clinic and confused as to where pt should come for labs.  Achille Richdvised Amanda to direct them to our office as pt had 10am lab appt. Pt did not come to our office. S/w Marchelle FolksAmanda who states pt appeared a bit upset that she was in the wrong place. Attempted to contact pt. Left detailed VM with call back number.  Left message for daughter, [name] ([place]) as she has expressed desire to be notified regarding her mother's appointments.

## 2015-07-21 ENCOUNTER — Ambulatory Visit: Payer: Medicare Other | Admitting: Cardiovascular Disease

## 2015-07-23 ENCOUNTER — Ambulatory Visit (INDEPENDENT_AMBULATORY_CARE_PROVIDER_SITE_OTHER): Payer: Medicare Other | Admitting: Cardiovascular Disease

## 2015-07-23 ENCOUNTER — Encounter: Payer: Self-pay | Admitting: Cardiovascular Disease

## 2015-07-23 VITALS — BP 180/78 | HR 74 | Ht 63.5 in | Wt 189.0 lb

## 2015-07-23 DIAGNOSIS — I482 Chronic atrial fibrillation, unspecified: Secondary | ICD-10-CM

## 2015-07-23 MED ORDER — LOSARTAN POTASSIUM 100 MG PO TABS: 100.0000 mg | ORAL_TABLET | Freq: Every day | ORAL | Status: DC

## 2015-07-23 NOTE — Patient Instructions (Signed)
Medication Instructions:  Your physician has recommended you make the following change in your medication:  INCREASE losartan to 100mg once daily   Labwork: none  Testing/Procedures: none  Follow-Up: Your physician recommends that you schedule a follow-up appointment in: 3 months with Dr. Arida.    Any Other Special Instructions Will Be Listed Below (If Applicable).     If you need a refill on your cardiac medications before your next appointment, please call your pharmacy.   

## 2015-07-23 NOTE — Progress Notes (Signed)
Cardiology Office Note   Date:  07/23/2015   ID:  Kathy Lowery, DOB 02-22-1925, MRN [id number]  PCP:  Danella Penton, MD  Cardiologist:   Lorine Bears, MD   Chief Complaint  Patient presents with  . other    1 month F/U. Medications verbally reviewed with pt.       History of Present Illness: Kathy Lowery is a 82 y.o. female who presents for a follow-up visit regarding chronic systolic heart failure felt to be due to stress-induced cardiomyopathy, coronary artery disease and chronic atrial fibrillation.   She was hospitalized in February for heart failure and non-ST elevation myocardial infarction.Echocardiogram showed an ejection fraction of 30-35% with wall motion abnormalities suggestive of an LAD infarct. I proceeded with cardiac catheterization which showed significant two-vessel coronary artery disease affecting the right coronary artery and left circumflex with no significant disease affecting the LAD. She had wall motion abnormality suggestive of stress-induced cardiomyopathy. She was treated medically. Digoxin was discontinued due to bradycardia and she was continued on small dose Toprol. She improved with diuresis and was started on spironolactone. [facility] lives at [facility]. Her daughter [name] is her power of attorney. She lives in [place]. Furosemide was discontinued after her labs showed worsening kidney function. Follow-up labs showed that the creatinine was back to baseline. During last visit, blood pressure was very elevated and thus I increased metoprolol to 25 mg once daily and spironolactone 25 mg once daily. A follow-up basic metabolic profile showed normal renal function and electrolytes. She has been doing reasonably well and denies any chest pain or significant dyspnea. Blood pressure continues to be elevated but she attributes that again to being related to her appointment.    Past Medical History  Diagnosis Date  . HTN (hypertension)    . Hyperlipidemia   . Peripheral neuralgia   . Arthritis   . MI (myocardial infarction) (HCC)   . Chronic atrial fibrillation (HCC)   . Coronary artery disease     Non-ST elevation myocardial infarction in February 2017. Cardiac catheterization showed significant two-vessel coronary artery disease affecting the right coronary artery and left circumflex with no significant disease involving the LAD. Ejection fraction was 30-35% with akinesis of the mid to distal and apical segments consistent with stress-induced cardiomyopathy.  . CHF (congestive heart failure) [facility])     Past Surgical History  Procedure Laterality Date  . Appendectomy    . Lumbar spine surgery    . Cardiac catheterization N/A 04/23/2015    Procedure: Left Heart Cath and Coronary Angiography;  Surgeon: Iran Ouch, MD;  Location: [facility] INVASIVE CV LAB;  Service: Cardiovascular;  Laterality: N/A;     Current Outpatient Prescriptions  Medication Sig Dispense Refill  . acetaminophen-codeine (TYLENOL #3) 300-30 MG tablet Take 1 tablet by mouth every 4 (four) hours as needed for moderate pain. 30 tablet 0  . atorvastatin (LIPITOR) 40 MG tablet Take 1 tablet (40 mg total) by mouth daily at 6 PM. 30 tablet 0  . gemfibrozil (LOPID) 600 MG tablet Take 600 mg by mouth 2 (two) times daily before a meal.    . losartan (COZAAR) 50 MG tablet Take 1 tablet (50 mg total) by mouth daily. 30 tablet 0  . magnesium oxide (MAG-OX) 400 (241.3 Mg) MG tablet Take 1 tablet (400 mg total) by mouth 2 (two) times daily. 60 tablet 0  . metoprolol succinate (TOPROL-XL) 25 MG 24 hr tablet Take 1 tablet (25 mg total)  by mouth daily. 30 tablet 3  . oxyCODONE-acetaminophen (PERCOCET/ROXICET) 5-325 MG tablet Take 1 tablet by mouth once. 20 tablet 0  . spironolactone (ALDACTONE) 25 MG tablet Take 1 tablet (25 mg total) by mouth daily. 30 tablet 3  . warfarin (COUMADIN) 2 MG tablet Take 2 mg by mouth daily.     No current facility-administered  medications for this visit.    Allergies:   Review of patient's allergies indicates no known allergies.    Social History:  The patient  reports that she has quit smoking. She has never used smokeless tobacco. She reports that she does not drink alcohol or use illicit drugs.   Family History:  The patient's family history includes CAD in her father; Hypertension in her father; Tuberculosis in her mother.    ROS:  Please see the history of present illness.   Otherwise, review of systems are positive for none.   All other systems are reviewed and negative.    PHYSICAL EXAM: VS:  BP 180/78 mmHg  Pulse 74  Ht 5' 3.5" (1.613 m)  Wt 189 lb (85.73 kg)  BMI 32.95 kg/m2 , BMI Body mass index is 32.95 kg/(m^2). GEN: Well nourished, well developed, in no acute distress HEENT: normal Neck: no JVD, carotid bruits, or masses Cardiac: Irregularly irregular; no murmurs, rubs, or gallops. +1 edema slightly worse on the right side with some redness Respiratory:  clear to auscultation bilaterally, normal work of breathing GI: soft, nontender, nondistended, + BS MS: no deformity or atrophy Skin: warm and dry, no rash Neuro:  Strength and sensation are intact Psych: euthymic mood, full affect   EKG:  EKG is ordered today. The ekg ordered today demonstrates : Atrial fibrillation with ventricular rate of 75 bpm. Left bundle branch block.   Recent Labs: 04/19/2015: ALT 10*; B Natriuretic Peptide 664.0* 04/21/2015: Magnesium 1.9 04/25/2015: Hemoglobin 12.8; Platelets 211 06/23/2015: BUN 21*; Creatinine, Ser 0.87; Potassium 4.2; Sodium 138    Lipid Panel No results found for: CHOL, TRIG, HDL, CHOLHDL, VLDL, LDLCALC, LDLDIRECT    Wt Readings from Last 3 Encounters:  07/23/15 189 lb (85.73 kg)  06/18/15 185 lb (83.915 kg)  06/15/15 186 lb 8 oz (84.596 kg)        ASSESSMENT AND PLAN:  1.  Chronic systolic heart failure: This was likely due to stress-induced cardiomyopathy.  Continue  treatment with Toprol to 25 mg once daily and spironolactone 25 mg once daily. I increased losartan 100 mg once daily. She appears to be euvolemic without a new diuretic.   2. Coronary artery disease involving native coronary arteries without angina:  I recommend continuing aggressive medical therapy. She currently has no anginal symptoms.  3. Chronic atrial fibrillation: Ventricular rate is well controlled on small dose Toprol. The patient does have underlying left bundle branch block . Continue anticoagulation with warfarin which is managed by Dr. Hyacinth MeekerMiller.  4. Hyperlipidemia: She is currently on both atorvastatin and gemfibrozil. If triglyceride is not significantly elevated, I recommend discontinuing gemfibrozil. She has a follow-up appointment with Dr. Hyacinth MeekerMiller in July.  5. Essential hypertension: Blood pressure continues to be elevated. Losartan was increased as outlined above.   Disposition:   FU with me in 3 months  Signed,  Lorine BearsMuhammad Arida, MD  07/23/2015 12:09 PM    [facility]

## 2015-08-03 ENCOUNTER — Other Ambulatory Visit: Payer: Self-pay | Admitting: *Deleted

## 2015-08-03 ENCOUNTER — Telehealth: Payer: Self-pay | Admitting: Cardiovascular Disease

## 2015-08-03 MED ORDER — SPIRONOLACTONE 25 MG PO TABS: 25.0000 mg | ORAL_TABLET | Freq: Every day | ORAL | Status: AC

## 2015-08-03 MED ORDER — METOPROLOL SUCCINATE ER 25 MG PO TB24: 25.0000 mg | ORAL_TABLET | Freq: Every day | ORAL | Status: DC

## 2015-08-03 NOTE — Telephone Encounter (Signed)
Left message regarding 04/21/15 echo showing 30-35% EF. Left call back number if questions.

## 2015-08-03 NOTE — Telephone Encounter (Signed)
Please call Kathy MilletMegan She has additional question about ? chf dx if patient has one and if so is it diastolic or systolic?

## 2015-08-03 NOTE — Telephone Encounter (Signed)
Aundra MilletMegan, [facility], called to inquire of CHF diagnosis. Informed Aundra MilletMegan that pt has diagnosis of chronic systolic CHF. She inquired of last BP and f/u information. Provided her w/requested information. She verbalized understanding and is appreciative of the call.

## 2015-08-03 NOTE — Telephone Encounter (Signed)
[facility]urse [facility] calling wanted to know if patient is a CHF patient and would like to know the last ejection fraction was. Please call back.

## 2015-08-03 NOTE — Telephone Encounter (Signed)
°*  STAT* If patient is at the pharmacy, call can be transferred to refill team.   1. Which medications need to be refilled? (please list name of each medication and dose if known)  Spironolactone 25 mg and Metoprolol 25 mg   2. Which pharmacy/location (including street and city if local pharmacy) is medication to be sent to? [facility] on [address]  3. Do they need a 30 day or 90 day supply? 90 day    Pt is completely out and would like us to send it today.

## 2015-08-03 NOTE — Telephone Encounter (Signed)
Requested Prescriptions   Signed Prescriptions Disp Refills  . spironolactone (ALDACTONE) 25 MG tablet 90 tablet 3    Sig: Take 1 tablet (25 mg total) by mouth daily.    Authorizing Provider: Lorine BearsARIDA, MUHAMMAD A    Ordering User: Shawnie DapperLOPEZ, MARINA C  . metoprolol succinate (TOPROL-XL) 25 MG 24 hr tablet 90 tablet 3    Sig: Take 1 tablet (25 mg total) by mouth daily.    Authorizing Provider: Lorine BearsARIDA, MUHAMMAD A    Ordering User: Kendrick FriesLOPEZ, MARINA C

## 2015-08-27 ENCOUNTER — Telehealth: Payer: Self-pay | Admitting: Cardiovascular Disease

## 2015-08-27 NOTE — Telephone Encounter (Signed)
Patient daughter says she is having side effects from bp med increase.  Patient is falling down often and is dizzy . Feels like she is vibrating all over.  Patient is a  Bit disoriented.     This comes and goes.  Please call to discuss.

## 2015-08-27 NOTE — Telephone Encounter (Signed)
S/w pt daughter, Johny ShockKhristen, who reports she visited with her mom this past weekend in [place].  During the visit, pt stated that she has been dizzy, light-headed, with body "vibrating" periodically since losartan was increased to 100mg  at May 25 OV.  [facility] and does not take BP regularly. Advised daughter to have nurse [facility] monitor BP for several days and reports readings to us. She understands to have pt change positions slowly and hold BP meds if BP low.  Khristen will [facility] today w/instructions and understands to call on-call this weekend w/questions concerns.  Family is coordinating w/[facility] to have daily meds packaged together as pt daughter who lives close by is unable to assist w/putting pill box together. Daughter verbalized understanding, agreeable w/plan and is appreciative of the call.  Will await BP readings then forward to MD to advise.

## 2015-08-30 ENCOUNTER — Encounter: Payer: Self-pay | Admitting: Cardiovascular Disease

## 2015-09-08 ENCOUNTER — Ambulatory Visit: Payer: Medicare Other | Admitting: Family

## 2015-10-23 ENCOUNTER — Encounter: Payer: Self-pay | Admitting: Cardiovascular Disease

## 2015-10-23 ENCOUNTER — Ambulatory Visit (INDEPENDENT_AMBULATORY_CARE_PROVIDER_SITE_OTHER): Payer: Medicare Other | Admitting: Cardiovascular Disease

## 2015-10-23 VITALS — BP 160/80 | HR 68 | Ht 63.5 in | Wt 205.0 lb

## 2015-10-23 DIAGNOSIS — I482 Chronic atrial fibrillation, unspecified: Secondary | ICD-10-CM

## 2015-10-23 DIAGNOSIS — I5022 Chronic systolic (congestive) heart failure: Secondary | ICD-10-CM | POA: Diagnosis not present

## 2015-10-23 DIAGNOSIS — I10 Essential (primary) hypertension: Secondary | ICD-10-CM | POA: Diagnosis not present

## 2015-10-23 MED ORDER — LOSARTAN POTASSIUM 100 MG PO TABS: 100.0000 mg | ORAL_TABLET | Freq: Every day | ORAL | 3 refills | Status: DC

## 2015-10-23 MED ORDER — FUROSEMIDE 20 MG PO TABS: 20.0000 mg | ORAL_TABLET | Freq: Every day | ORAL | 5 refills | Status: DC

## 2015-10-23 NOTE — Progress Notes (Signed)
Cardiology Office Note   Date:  10/23/2015   ID:  Kathy Lowery, DOB Jun 30, 1924, MRN [id number]  PCP:  Danella Penton, MD  Cardiologist:   Lorine Bears, MD   Chief Complaint  Patient presents with  . Other    3 month follow up. Pt. c/o LE edema, dizziness and shortness of breath.       History of Present Illness: Kathy Lowery is a 82 y.o. female who presents for a follow-up visit regarding chronic systolic heart failure felt to be due to stress-induced cardiomyopathy, coronary artery disease and chronic atrial fibrillation.   She was hospitalized in February, 2017 for heart failure and non-ST elevation myocardial infarction.Echocardiogram showed an ejection fraction of 30-35% with wall motion abnormalities suggestive of an LAD infarct. I proceeded with cardiac catheterization which showed significant two-vessel coronary artery disease affecting the right coronary artery and left circumflex with no significant disease affecting the LAD. She had wall motion abnormality suggestive of stress-induced cardiomyopathy. She was treated medically.  [facility] at [facility]. Her daughter Kathy Lowery is her power of attorney. She lives in [place]. Furosemide was discontinued after her labs showed worsening kidney function.  She was noted to be hypertensive during last visit. Losartan was increased to 100 mg once daily. The patient's daughter reported episodes of dizziness when standing up. The patient mentions that she only had that a few times when she took her medications without eating. She does report increased shortness of breath and leg edema. Actually, she has gained 17 pounds since her last visit. She was seen by Dr. Hyacinth Meeker last month and was given 10 day course of torsemide. Her symptoms improved with that but she developed recurrent edema after she finished a 10 day course. She denies any chest pain.  Past Medical History:  Diagnosis Date  . Arthritis   . CHF  (congestive heart failure) (HCC)   . Chronic atrial fibrillation (HCC)   . Coronary artery disease    Non-ST elevation myocardial infarction in February 2017. Cardiac catheterization showed significant two-vessel coronary artery disease affecting the right coronary artery and left circumflex with no significant disease involving the LAD. Ejection fraction was 30-35% with akinesis of the mid to distal and apical segments consistent with stress-induced cardiomyopathy.  Marland Kitchen HTN (hypertension)   . Hyperlipidemia   . MI (myocardial infarction) (HCC)   . Peripheral neuralgia     Past Surgical History:  Procedure Laterality Date  . APPENDECTOMY    . CARDIAC CATHETERIZATION N/A 04/23/2015   Procedure: Left Heart Cath and Coronary Angiography;  Surgeon: Iran Ouch, MD;  Location: [facility] INVASIVE CV LAB;  Service: Cardiovascular;  Laterality: N/A;  . LUMBAR SPINE SURGERY       Current Outpatient Prescriptions  Medication Sig Dispense Refill  . acetaminophen-codeine (TYLENOL #3) 300-30 MG tablet Take 1 tablet by mouth every 4 (four) hours as needed for moderate pain. 30 tablet 0  . atorvastatin (LIPITOR) 40 MG tablet Take 1 tablet (40 mg total) by mouth daily at 6 PM. 30 tablet 0  . gemfibrozil (LOPID) 600 MG tablet Take 600 mg by mouth 2 (two) times daily before a meal.    . losartan (COZAAR) 100 MG tablet Take 1 tablet (100 mg total) by mouth daily. 30 tablet 3  . metoprolol succinate (TOPROL-XL) 25 MG 24 hr tablet Take 1 tablet (25 mg total) by mouth daily. 90 tablet 3  . oxyCODONE-acetaminophen (PERCOCET/ROXICET) 5-325 MG tablet Take 1 tablet by  mouth once. 20 tablet 0  . spironolactone (ALDACTONE) 25 MG tablet Take 1 tablet (25 mg total) by mouth daily. 90 tablet 3  . warfarin (COUMADIN) 2 MG tablet Take 2 mg by mouth daily.    . furosemide (LASIX) 20 MG tablet Take 1 tablet (20 mg total) by mouth daily. 30 tablet 5   No current facility-administered medications for this visit.      Allergies:   Review of patient's allergies indicates no known allergies.    Social History:  The patient  reports that she has quit smoking. She quit after 16.00 years of use. She has never used smokeless tobacco. She reports that she does not drink alcohol or use drugs.   Family History:  The patient's family history includes CAD in her father; Hypertension in her father; Tuberculosis in her mother.    ROS:  Please see the history of present illness.   Otherwise, review of systems are positive for none.   All other systems are reviewed and negative.    PHYSICAL EXAM: VS:  BP (!) 160/80 (BP Location: Left Arm, Patient Position: Sitting, Cuff Size: Normal)   Pulse 68   Ht 5' 3.5" (1.613 m)   Wt 205 lb (93 kg)   BMI 35.74 kg/m  , BMI Body mass index is 35.74 kg/m. GEN: Well nourished, well developed, in no acute distress  HEENT: normal  Neck: no  carotid bruits, or masses. Positive JVD Cardiac: Irregularly irregular; no murmurs, rubs, or gallops. +3  edema slightly worse on the right side with some redness Respiratory:  clear to auscultation bilaterally, normal work of breathing GI: soft, nontender, nondistended, + BS MS: no deformity or atrophy  Skin: warm and dry, no rash Neuro:  Strength and sensation are intact Psych: euthymic mood, full affect   EKG:  EKG is ordered today. The ekg ordered today demonstrates : Atrial fibrillation with ventricular rate of 68 bpm. Left bundle branch block.   Recent Labs: 04/19/2015: ALT 10; B Natriuretic Peptide 664.0 04/21/2015: Magnesium 1.9 04/25/2015: Hemoglobin 12.8; Platelets 211 06/23/2015: BUN 21; Creatinine, Ser 0.87; Potassium 4.2; Sodium 138    Lipid Panel No results found for: CHOL, TRIG, HDL, CHOLHDL, VLDL, LDLCALC, LDLDIRECT    Wt Readings from Last 3 Encounters:  10/23/15 205 lb (93 kg)  07/23/15 189 lb (85.7 kg)  06/18/15 185 lb (83.9 kg)        ASSESSMENT AND PLAN:  1. Acute on chronic systolic heart  failure:  The patient is significantly volume overloaded and has gained 17 pounds since her last visit. In the past, she developed volume depletion with torsemide even the 20 mg dose. Thus, I elected to start her instead on furosemide 20 mg once daily. Hopefully she can stay on this as a maintenance dose. Check basic metabolic profile in one week. Continue treatment with Toprol, spironolactone and losartan.  2. Coronary artery disease involving native coronary arteries without angina:  I recommend continuing aggressive medical therapy. She currently has no anginal symptoms.  3. Chronic atrial fibrillation: Ventricular rate is well controlled on small dose Toprol.  Continue anticoagulation with warfarin which is managed by Dr. Hyacinth MeekerMiller.  4. Hyperlipidemia: She is currently on both atorvastatin and gemfibrozil.  5. Essential hypertension:  Blood pressure continues to be elevated but better than before. I'm hesitant to increase or add other blood pressure medications given her orthostatic hypotension.   Disposition:   FU with me in 1 month with Ryan.   Signed,  Jerolyn CenterMuhammad  Kirke Corin, MD  10/23/2015 12:44 PM    [facility]

## 2015-10-23 NOTE — Patient Instructions (Signed)
Medication Instructions:  Your physician has recommended you make the following change in your medication:  START lasix 20mg  once daily   Labwork: BMET in one week  Testing/Procedures: none  Follow-Up: Your physician recommends that you schedule a follow-up appointment in: one month with Eula Listenyan Dunn, PA-C   Any Other Special Instructions Will Be Listed Below (If Applicable).     If you need a refill on your cardiac medications before your next appointment, please call your pharmacy.

## 2015-10-30 ENCOUNTER — Other Ambulatory Visit (INDEPENDENT_AMBULATORY_CARE_PROVIDER_SITE_OTHER): Payer: Medicare Other

## 2015-10-30 DIAGNOSIS — I5022 Chronic systolic (congestive) heart failure: Secondary | ICD-10-CM | POA: Diagnosis not present

## 2015-10-31 LAB — BASIC METABOLIC PANEL
BUN/Creatinine Ratio: 38 — ABNORMAL HIGH (ref 12–28)
BUN: 41 mg/dL — ABNORMAL HIGH (ref 8–27)
CALCIUM: 9.5 mg/dL (ref 8.7–10.3)
CHLORIDE: 103 mmol/L (ref 96–106)
CO2: 19 mmol/L (ref 18–29)
Creatinine, Ser: 1.07 mg/dL — ABNORMAL HIGH (ref 0.57–1.00)
GFR calc Af Amer: 53 mL/min/{1.73_m2} — ABNORMAL LOW (ref >59)
GFR, EST NON AFRICAN AMERICAN: 46 mL/min/{1.73_m2} — AB (ref >59)
GLUCOSE: 107 mg/dL — AB (ref 65–99)
POTASSIUM: 4.1 mmol/L (ref 3.5–5.2)
SODIUM: 140 mmol/L (ref 134–144)

## 2015-11-03 ENCOUNTER — Other Ambulatory Visit: Payer: Self-pay

## 2015-11-03 MED ORDER — FUROSEMIDE 20 MG PO TABS: 20.0000 mg | ORAL_TABLET | ORAL | 5 refills | Status: DC

## 2015-11-26 ENCOUNTER — Ambulatory Visit (INDEPENDENT_AMBULATORY_CARE_PROVIDER_SITE_OTHER): Payer: Medicare Other | Admitting: Physician Assistant

## 2015-11-26 ENCOUNTER — Encounter: Payer: Self-pay | Admitting: Physician Assistant

## 2015-11-26 VITALS — BP 139/80 | HR 69 | Ht 63.0 in | Wt 203.2 lb

## 2015-11-26 DIAGNOSIS — I5022 Chronic systolic (congestive) heart failure: Secondary | ICD-10-CM | POA: Diagnosis not present

## 2015-11-26 DIAGNOSIS — I739 Peripheral vascular disease, unspecified: Secondary | ICD-10-CM

## 2015-11-26 DIAGNOSIS — I10 Essential (primary) hypertension: Secondary | ICD-10-CM | POA: Diagnosis not present

## 2015-11-26 DIAGNOSIS — I482 Chronic atrial fibrillation, unspecified: Secondary | ICD-10-CM

## 2015-11-26 DIAGNOSIS — E785 Hyperlipidemia, unspecified: Secondary | ICD-10-CM

## 2015-11-26 DIAGNOSIS — I2510 Atherosclerotic heart disease of native coronary artery without angina pectoris: Secondary | ICD-10-CM

## 2015-11-26 NOTE — Progress Notes (Signed)
Cardiology Office Note Date:  11/26/2015  Patient ID:  Kathy, Lowery 1925/02/17, MRN [id number] PCP:  Danella Penton, MD  Cardiologist:  Dr. Kirke Corin, MD    Chief Complaint: Follow up CHF  History of Present Illness: Kathy Lowery is a 82 y.o. female with history of chronic systolic CHF felt to be 2/2 stress-induced cardiomyopathy, CAD managed medically as below, chronic Afib on Coumadin, history of AKI leading to the cessation of Lasix, hypertensive heart disease, and HLD who presents for routine follow up of the above.   She was hospitalized in 04/2015 for CHF and NSTEMI. Echo showed an EF of 30-35% with WMA suggestive of an LAD infarct. She underwent LHC that showed significant 2-vessel CAD affecting the RCA and LCx with no significant disease affecting the LAD. She had WMA suggestive of stress-induced cardiomyopathy. She was treated medically. In the past her BP has been noted to be elevated leading to titration of losartan. At times after this she was noted to have some dizziness with positional changes, only if she had not eaten however. At her last visit with Dr. Kirke Corin on 8/25 she noted increased SOB with LE swelling. She had gained 17 pounds when compared to her last office visit with him, after receiving a 10 day course of torsemide from her PCP in July which briefly led to improvement in her symptoms, though they returned after she completed the torsemide course. She was further diuresed with Lasix 20 mg as in the past she had developed volume depletion on torsemide (even the 20 mg dose). Follow up bmet showed a mildly increased renal function from 0.87 to 1.07. Her Lasix was held x 2 days and then resumed at 20 mg every other day.    She is doing well today and notes improved breathing. She notes some LE pain with ambulation, no prior LE evaluation. LE swelling stable to slightly improved. Weight down 2 pounds from last visit. She reports eating a lot of sweets lately. No salt intake and has  been monitoring her PO fluid consumption. She feels like she is doing well at this time.    She lives at [facility], with her daughter Kathy Lowery being her POA (lives in [place]).   Past Medical History:  Diagnosis Date  . Arthritis   . CHF (congestive heart failure) (HCC)   . Chronic atrial fibrillation (HCC)   . Coronary artery disease    Non-ST elevation myocardial infarction in February 2017. Cardiac catheterization showed significant two-vessel coronary artery disease affecting the right coronary artery and left circumflex with no significant disease involving the LAD. Ejection fraction was 30-35% with akinesis of the mid to distal and apical segments consistent with stress-induced cardiomyopathy.  Marland Kitchen HTN (hypertension)   . Hyperlipidemia   . MI (myocardial infarction) (HCC)   . Peripheral neuralgia     Past Surgical History:  Procedure Laterality Date  . APPENDECTOMY    . CARDIAC CATHETERIZATION N/A 04/23/2015   Procedure: Left Heart Cath and Coronary Angiography;  Surgeon: Iran Ouch, MD;  Location: [facility] INVASIVE CV LAB;  Service: Cardiovascular;  Laterality: N/A;  . LUMBAR SPINE SURGERY      Current Outpatient Prescriptions  Medication Sig Dispense Refill  . acetaminophen-codeine (TYLENOL #3) 300-30 MG tablet Take 1 tablet by mouth every 4 (four) hours as needed for moderate pain. 30 tablet 0  . atorvastatin (LIPITOR) 40 MG tablet Take 1 tablet (40 mg total) by mouth daily at 6 PM. 30 tablet 0  .  furosemide (LASIX) 20 MG tablet Take 1 tablet (20 mg total) by mouth every other day. 30 tablet 5  . gemfibrozil (LOPID) 600 MG tablet Take 600 mg by mouth 2 (two) times daily before a meal.    . losartan (COZAAR) 100 MG tablet Take 1 tablet (100 mg total) by mouth daily. 30 tablet 3  . metoprolol succinate (TOPROL-XL) 25 MG 24 hr tablet Take 1 tablet (25 mg total) by mouth daily. 90 tablet 3  . oxyCODONE-acetaminophen (PERCOCET/ROXICET) 5-325 MG tablet Take 1 tablet by mouth once. 20  tablet 0  . spironolactone (ALDACTONE) 25 MG tablet Take 1 tablet (25 mg total) by mouth daily. 90 tablet 3  . warfarin (COUMADIN) 2 MG tablet Take 2 mg by mouth daily.     No current facility-administered medications for this visit.     Allergies:   Review of patient's allergies indicates no known allergies.   Social History:  The patient  reports that she has quit smoking. She quit after 16.00 years of use. She has never used smokeless tobacco. She reports that she does not drink alcohol or use drugs.   Family History:  The patient's family history includes CAD in her father; Hypertension in her father; Tuberculosis in her mother.  ROS:   Review of Systems  Constitutional: Positive for malaise/fatigue. Negative for chills, diaphoresis, fever and weight loss.  HENT: Negative for congestion.   Eyes: Negative for discharge and redness.  Respiratory: Negative for cough, sputum production, shortness of breath and wheezing.   Cardiovascular: Positive for claudication and leg swelling. Negative for chest pain, palpitations, orthopnea and PND.  Gastrointestinal: Negative for abdominal pain, heartburn, nausea and vomiting.  Musculoskeletal: Positive for myalgias. Negative for falls.  Skin: Negative for rash.  Neurological: Negative for dizziness, tingling, tremors, sensory change, speech change, focal weakness, loss of consciousness and weakness.  Endo/Heme/Allergies: Does not bruise/bleed easily.  Psychiatric/Behavioral: Negative for substance abuse. The patient is not nervous/anxious.   All other systems reviewed and are negative.    PHYSICAL EXAM:  VS:  BP 139/80 (BP Location: Left Arm, Patient Position: Sitting, Cuff Size: Normal)   Pulse 69   Ht 5\' 3"  (1.6 m)   Wt 203 lb 4 oz (92.2 kg)   BMI 36.00 kg/m  BMI: Body mass index is 36 kg/m.  Physical Exam  Constitutional: She is oriented to person, place, and time. She appears well-developed and well-nourished.  HENT:  Head:  Normocephalic and atraumatic.  Eyes: Right eye exhibits no discharge. Left eye exhibits no discharge.  Neck: Normal range of motion. No JVD present.  Cardiovascular: Normal rate, regular rhythm, S1 normal, S2 normal and normal heart sounds.  Exam reveals no distant heart sounds, no friction rub, no midsystolic click and no opening snap.   No murmur heard. Pulmonary/Chest: Effort normal and breath sounds normal. No respiratory distress. She has no decreased breath sounds. She has no wheezes. She has no rales. She exhibits no tenderness.  Abdominal: Soft. She exhibits no distension. There is no tenderness.  Musculoskeletal: She exhibits edema.  1+ pitting edema to the bilateral LE  Neurological: She is alert and oriented to person, place, and time.  Skin: Skin is warm and dry. No cyanosis. Nails show no clubbing.  Psychiatric: She has a normal mood and affect. Her speech is normal and behavior is normal. Judgment and thought content normal.     EKG:  Was ordered and interpreted by me today. Shows Afib, 69 bpm, LBBB (old)  Recent Labs: 04/19/2015: ALT 10; B Natriuretic Peptide 664.0 04/21/2015: Magnesium 1.9 04/25/2015: Hemoglobin 12.8; Platelets 211 10/30/2015: BUN 41; Creatinine, Ser 1.07; Potassium 4.1; Sodium 140  No results found for requested labs within last 8760 hours.   CrCl cannot be calculated (Patient's most recent lab result is older than the maximum 21 days allowed.).   Wt Readings from Last 3 Encounters:  11/26/15 203 lb 4 oz (92.2 kg)  10/23/15 205 lb (93 kg)  07/23/15 189 lb (85.7 kg)     Other studies reviewed: Additional studies/records reviewed today include: summarized above  ASSESSMENT AND PLAN:  1. Chronic systolic CHF: Weight remains elevated at this time (down 2 pounds from 1 month ago) when compared weight from May. She reports a diet high in sweets lately. Continue Lasix at current dosing pending bmet. Continue Toprol XL and spironolactone.   2. CAD as above:  No symptoms concerning for angina. On Coumadin in place of ASA. Continue current medical therapy. No plans for ischemic evaluation at this time.   3. Chronic Afib: Currently, rate controlled. Continue Toprol XL. Continue Coumadin which is managed by Dr. Hyacinth Meeker, MD. CHADS2VASc at least 6 (CHF, HTN, age x 2, vascular disease, sex category).   4. Claudication: Schedule LE doppler and ABI's.   5. HLD: Lipitor.   6. HTN: Well controlled today. Continue current medications.   Disposition: F/u with Dr. Kirke Corin in 6 weeks.   Current medicines are reviewed at length with the patient today.  The patient did not have any concerns regarding medicines.  Elinor Dodge PA-C 11/26/2015 2:00 PM     [facility] [place] [address] [place], [place] [address] [phone]

## 2015-11-26 NOTE — Patient Instructions (Signed)
Medication Instructions:  Your physician recommends that you continue on your current medications as directed. Please refer to the Current Medication list given to you today.   Labwork: BMET  Testing/Procedures: Your physician has requested that you have an ankle brachial index (ABI). During this test an ultrasound and blood pressure cuff are used to evaluate the arteries that supply the arms and legs with blood. Allow thirty minutes for this exam. There are no restrictions or special instructions.  Your physician has requested that you have a lower or upper extremity arterial duplex. This test is an ultrasound of the arteries in the legs or arms. It looks at arterial blood flow in the legs and arms. Allow one hour for Lower and Upper Arterial scans. There are no restrictions or special instructions   Follow-Up: Your physician recommends that you schedule a follow-up appointment in: 6 weeks with Dr. Kirke CorinArida or Eula Listenyan Dunn, PA-C   Any Other Special Instructions Will Be Listed Below (If Applicable).     If you need a refill on your cardiac medications before your next appointment, please call your pharmacy.

## 2015-11-27 LAB — BASIC METABOLIC PANEL
BUN / CREAT RATIO: 35 — AB (ref 12–28)
BUN: 38 mg/dL — AB (ref 8–27)
CO2: 20 mmol/L (ref 18–29)
CREATININE: 1.1 mg/dL — AB (ref 0.57–1.00)
Calcium: 9.1 mg/dL (ref 8.7–10.3)
Chloride: 105 mmol/L (ref 96–106)
GFR calc Af Amer: 51 mL/min/{1.73_m2} — ABNORMAL LOW (ref >59)
GFR, EST NON AFRICAN AMERICAN: 45 mL/min/{1.73_m2} — AB (ref >59)
Glucose: 111 mg/dL — ABNORMAL HIGH (ref 65–99)
Potassium: 4.8 mmol/L (ref 3.5–5.2)
SODIUM: 142 mmol/L (ref 134–144)

## 2015-12-14 ENCOUNTER — Other Ambulatory Visit: Payer: Self-pay | Admitting: Physician Assistant

## 2015-12-14 DIAGNOSIS — I739 Peripheral vascular disease, unspecified: Secondary | ICD-10-CM

## 2015-12-22 ENCOUNTER — Ambulatory Visit: Payer: Medicare Other

## 2015-12-22 DIAGNOSIS — I739 Peripheral vascular disease, unspecified: Secondary | ICD-10-CM

## 2015-12-29 ENCOUNTER — Ambulatory Visit (INDEPENDENT_AMBULATORY_CARE_PROVIDER_SITE_OTHER): Payer: Medicare Other | Admitting: Cardiovascular Disease

## 2015-12-29 ENCOUNTER — Encounter: Payer: Self-pay | Admitting: Cardiovascular Disease

## 2015-12-29 VITALS — BP 179/81 | HR 63 | Ht 63.0 in | Wt 207.2 lb

## 2015-12-29 DIAGNOSIS — I739 Peripheral vascular disease, unspecified: Secondary | ICD-10-CM

## 2015-12-29 DIAGNOSIS — I482 Chronic atrial fibrillation, unspecified: Secondary | ICD-10-CM

## 2015-12-29 DIAGNOSIS — I2510 Atherosclerotic heart disease of native coronary artery without angina pectoris: Secondary | ICD-10-CM

## 2015-12-29 DIAGNOSIS — I5022 Chronic systolic (congestive) heart failure: Secondary | ICD-10-CM | POA: Diagnosis not present

## 2015-12-29 NOTE — Progress Notes (Signed)
Cardiology Office Note   Date:  12/29/2015   ID:  Kathy CarbonDoris Krenn, DOB 06-03-1924, MRN [id number]  PCP:  Danella PentonMark F Miller, MD  Cardiologist:   Lorine BearsMuhammad Hollin Crewe, MD   Chief Complaint  Patient presents with  . other    Follow up ABI/LE ART. Meds reviewed by the pt. verbally. "doing well."       History of Present Illness: Kathy Lowery is a 82 y.o. female who presents for a follow-up visit regarding chronic systolic heart failure felt to be due to stress-induced cardiomyopathy, coronary artery disease and chronic atrial fibrillation.   She was hospitalized in February, 2017 for heart failure and non-ST elevation myocardial infarction.Echocardiogram showed an ejection fraction of 30-35% with wall motion abnormalities suggestive of an LAD infarct. I proceeded with cardiac catheterization which showed significant two-vessel coronary artery disease affecting the right coronary artery and left circumflex with no significant disease affecting the LAD. She had wall motion abnormality suggestive of stress-induced cardiomyopathy. She was treated medically.  [facility] lives at [facility]. Her daughter Genice RougeKhrisin is her power of attorney. [place] She had progressive issues of volume overload with diuresis has been limited by worsening renal function even with small dose diuretics. She is currently on furosemide 20 mg once daily but her weight did increase. Pounds from last visit. Blood pressure continues to run high although she reports that her blood pressure is more controlled at home. She denies any chest pain. Dyspnea is stable.    Past Medical History:  Diagnosis Date  . Arthritis   . CHF (congestive heart failure) (HCC)   . Chronic atrial fibrillation (HCC)   . Coronary artery disease    Non-ST elevation myocardial infarction in February 2017. Cardiac catheterization showed significant two-vessel coronary artery disease affecting the right coronary artery and left  circumflex with no significant disease involving the LAD. Ejection fraction was 30-35% with akinesis of the mid to distal and apical segments consistent with stress-induced cardiomyopathy.  Marland Kitchen. HTN (hypertension)   . Hyperlipidemia   . MI (myocardial infarction)   . Peripheral neuralgia     Past Surgical History:  Procedure Laterality Date  . APPENDECTOMY    . CARDIAC CATHETERIZATION N/A 04/23/2015   Procedure: Left Heart Cath and Coronary Angiography;  Surgeon: Iran OuchMuhammad A Deona Novitski, MD;  Location: [facility] INVASIVE CV LAB;  Service: Cardiovascular;  Laterality: N/A;  . LUMBAR SPINE SURGERY       Current Outpatient Prescriptions  Medication Sig Dispense Refill  . acetaminophen-codeine (TYLENOL #3) 300-30 MG tablet Take 1 tablet by mouth every 4 (four) hours as needed for moderate pain. 30 tablet 0  . atorvastatin (LIPITOR) 40 MG tablet Take 1 tablet (40 mg total) by mouth daily at 6 PM. 30 tablet 0  . furosemide (LASIX) 20 MG tablet Take 1 tablet (20 mg total) by mouth every other day. 30 tablet 5  . gemfibrozil (LOPID) 600 MG tablet Take 600 mg by mouth 2 (two) times daily before a meal.    . losartan (COZAAR) 100 MG tablet Take 1 tablet (100 mg total) by mouth daily. 30 tablet 3  . metoprolol succinate (TOPROL-XL) 25 MG 24 hr tablet Take 1 tablet (25 mg total) by mouth daily. 90 tablet 3  . potassium chloride (K-DUR) 10 MEQ tablet Take 10 mEq by mouth daily.     Marland Kitchen. spironolactone (ALDACTONE) 25 MG tablet Take 1 tablet (25 mg total) by mouth daily. 90 tablet 3  . warfarin (  COUMADIN) 2 MG tablet Take 2 mg by mouth daily.     No current facility-administered medications for this visit.     Allergies:   Review of patient's allergies indicates no known allergies.    Social History:  The patient  reports that she has quit smoking. She quit after 16.00 years of use. She has never used smokeless tobacco. She reports that she does not drink alcohol or use drugs.   Family History:  The patient's  family history includes CAD in her father; Hypertension in her father; Tuberculosis in her mother.    ROS:  Please see the history of present illness.   Otherwise, review of systems are positive for none.   All other systems are reviewed and negative.    PHYSICAL EXAM: VS:  BP (!) 179/81 (BP Location: Left Arm, Patient Position: Sitting, Cuff Size: Normal)   Pulse 63   Ht 5\' 3"  (1.6 m)   Wt 207 lb 4 oz (94 kg)   BMI 36.71 kg/m  , BMI Body mass index is 36.71 kg/m. GEN: Well nourished, well developed, in no acute distress  HEENT: normal  Neck: no  carotid bruits, or masses. Mild JVD Cardiac: Irregularly irregular; no murmurs, rubs, or gallops. +2 edema slightly worse on the right side with some redness Respiratory:  clear to auscultation bilaterally, normal work of breathing GI: soft, nontender, nondistended, + BS MS: no deformity or atrophy  Skin: warm and dry, no rash Neuro:  Strength and sensation are intact Psych: euthymic mood, full affect   EKG:  EKG is ordered today. The ekg ordered today demonstrates : Atrial fibrillation with ventricular rate of 68 bpm. Left bundle branch block.   Recent Labs: 04/19/2015: ALT 10; B Natriuretic Peptide 664.0 04/21/2015: Magnesium 1.9 04/25/2015: Hemoglobin 12.8; Platelets 211 11/26/2015: BUN 38; Creatinine, Ser 1.10; Potassium 4.8; Sodium 142    Lipid Panel No results found for: CHOL, TRIG, HDL, CHOLHDL, VLDL, LDLCALC, LDLDIRECT    Wt Readings from Last 3 Encounters:  12/29/15 207 lb 4 oz (94 kg)  11/26/15 203 lb 4 oz (92.2 kg)  10/23/15 205 lb (93 kg)        ASSESSMENT AND PLAN:  1. Chronic systolic heart failure:  She continues to be volume overloaded but she has been very sensitive to diuresis with worsening kidney function. Thus, I elected to keep her on the same dose furosemide. I discussed with her the importance of low sodium diet and she was provided with instructions. She admits to excessive carbohydrate intake which  might be leading to weight gain as well.  2. Coronary artery disease involving native coronary arteries without angina:  I recommend continuing aggressive medical therapy. She currently has no anginal symptoms.  3. Chronic atrial fibrillation: Ventricular rate is well controlled on small dose Toprol.  Continue anticoagulation with warfarin which is managed by Dr. Hyacinth MeekerMiller.  4. Hyperlipidemia: She is currently on both atorvastatin and gemfibrozil.  5. Essential hypertension:   She reports much better blood pressure readings at home. I might consider switching furosemide to a thiazide diuretic.  6. Peripheral arterial disease: ABI was mildly reduced on the left side at 0.89. She has mild left leg pain with walking but overall physical capacity is very limited by other medical conditions. I recommend medical therapy.  Disposition:   FU with me in 3 month .  Signed,  Lorine BearsMuhammad Keshia Weare, MD  12/29/2015 2:09 PM    [facility]

## 2015-12-29 NOTE — Patient Instructions (Addendum)
Medication Instructions:  Your physician recommends that you continue on your current medications as directed. Please refer to the Current Medication list given to you today.   Labwork: None  Testing/Procedures: None  Follow-Up: Your physician recommends that you schedule a follow-up appointment in: 3 months with Dr. Kirke CorinArida.    Any Other Special Instructions Will Be Listed Below (If Applicable).     If you need a refill on your cardiac medications before your next appointment, please call your pharmacy.   Low-Sodium Eating Plan Sodium raises blood pressure and causes water to be held in the body. Getting less sodium from food will help lower your blood pressure, reduce any swelling, and protect your heart, liver, and kidneys. We get sodium by adding salt (sodium chloride) to food. Most of our sodium comes from canned, boxed, and frozen foods. Restaurant foods, fast foods, and pizza are also very high in sodium. Even if you take medicine to lower your blood pressure or to reduce fluid in your body, getting less sodium from your food is important. WHAT IS MY PLAN? Most people should limit their sodium intake to 2,300 mg a day. Your health care provider recommends that you limit your sodium intake. WHAT DO I NEED TO KNOW ABOUT THIS EATING PLAN? For the low-sodium eating plan, you will follow these general guidelines:  Choose foods with a % Daily Value for sodium of less than 5% (as listed on the food label).   Use salt-free seasonings or herbs instead of table salt or sea salt.   Check with your health care provider or pharmacist before using salt substitutes.   Eat fresh foods.  Eat more vegetables and fruits.  Limit canned vegetables. If you do use them, rinse them well to decrease the sodium.   Limit cheese to 1 oz (28 g) per day.   Eat lower-sodium products, often labeled as "lower sodium" or "no salt added."  Avoid foods that contain monosodium glutamate (MSG). MSG  is sometimes added to Cong[place] food and some canned foods.  Check food labels (Nutrition Facts labels) on foods to learn how much sodium is in one serving.  Eat more home-cooked food and less restaurant, buffet, and fast food.  When eating at a restaurant, ask that your food be prepared with less salt, or no salt if possible.  HOW DO I READ FOOD LABELS FOR SODIUM INFORMATION? The Nutrition Facts label lists the amount of sodium in one serving of the food. If you eat more than one serving, you must multiply the listed amount of sodium by the number of servings. Food labels may also identify foods as:  Sodium free--Less than 5 mg in a serving.  Very low sodium--35 mg or less in a serving.  Low sodium--140 mg or less in a serving.  Light in sodium--50% less sodium in a serving. For example, if a food that usually has 300 mg of sodium is changed to become light in sodium, it will have 150 mg of sodium.  Reduced sodium--25% less sodium in a serving. For example, if a food that usually has 400 mg of sodium is changed to reduced sodium, it will have 300 mg of sodium. WHAT FOODS CAN I EAT? Grains Low-sodium cereals, including oats, puffed wheat and rice, and shredded wheat cereals. Low-sodium crackers. Unsalted rice and pasta. Lower-sodium bread.  Vegetables Frozen or fresh vegetables. Low-sodium or reduced-sodium canned vegetables. Low-sodium or reduced-sodium tomato sauce and paste. Low-sodium or reduced-sodium tomato and vegetable juices.  Fruits Fresh,  frozen, and canned fruit. Fruit juice.  Meat and Other Protein Products Low-sodium canned tuna and salmon. Fresh or frozen meat, poultry, seafood, and fish. Lamb. Unsalted nuts. Dried beans, peas, and lentils without added salt. Unsalted canned beans. Homemade soups without salt. Eggs.  Dairy Milk. Soy milk. Ricotta cheese. Low-sodium or reduced-sodium cheeses. Yogurt.  Condiments Fresh and dried herbs and spices. Salt-free  seasonings. Onion and garlic powders. Low-sodium varieties of mustard and ketchup. Fresh or refrigerated horseradish. Lemon juice.  Fats and Oils Reduced-sodium salad dressings. Unsalted butter.  Other Unsalted popcorn and pretzels.  The items listed above may not be a complete list of recommended foods or beverages. Contact your dietitian for more options. WHAT FOODS ARE NOT RECOMMENDED? Grains Instant hot cereals. Bread stuffing, pancake, and biscuit mixes. Croutons. Seasoned rice or pasta mixes. Noodle soup cups. Boxed or frozen macaroni and cheese. Self-rising flour. Regular salted crackers. Vegetables Regular canned vegetables. Regular canned tomato sauce and paste. Regular tomato and vegetable juices. Frozen vegetables in sauces. Salted Jamaica[place] fries. Olives. Rosita FirePickles. Relishes. Sauerkraut. Salsa. Meat and Other Protein Products Salted, canned, smoked, spiced, or pickled meats, seafood, or fish. Bacon, ham, sausage, hot dogs, corned beef, chipped beef, and packaged luncheon meats. Salt pork. Jerky. Pickled herring. Anchovies, regular canned tuna, and sardines. Salted nuts. Dairy Processed cheese and cheese spreads. Cheese curds. Blue cheese and cottage cheese. Buttermilk.  Condiments Onion and garlic salt, seasoned salt, table salt, and sea salt. Canned and packaged gravies. Worcestershire sauce. Tartar sauce. Barbecue sauce. Teriyaki sauce. Soy sauce, including reduced sodium. Steak sauce. Fish sauce. Oyster sauce. Cocktail sauce. Horseradish that you find on the shelf. Regular ketchup and mustard. Meat flavorings and tenderizers. Bouillon cubes. Hot sauce. Tabasco sauce. Marinades. Taco seasonings. Relishes. Fats and Oils Regular salad dressings. Salted butter. Margarine. Ghee. Bacon fat.  Other Potato and tortilla chips. Corn chips and puffs. Salted popcorn and pretzels. Canned or dried soups. Pizza. Frozen entrees and pot pies.  The items listed above may not be a complete  list of foods and beverages to avoid. Contact your dietitian for more information.   This information is not intended to replace advice given to you by your health care provider. Make sure you discuss any questions you have with your health care provider.   Document Released: 08/06/2001 Document Revised: 03/07/2014 Document Reviewed: 12/19/2012 Elsevier Interactive Patient Education [employer]2016 Elsevier Inc.

## 2016-03-29 DIAGNOSIS — Z0000 Encounter for general adult medical examination without abnormal findings: Secondary | ICD-10-CM | POA: Insufficient documentation

## 2016-03-29 DIAGNOSIS — E782 Mixed hyperlipidemia: Secondary | ICD-10-CM | POA: Insufficient documentation

## 2016-04-05 ENCOUNTER — Encounter: Payer: Self-pay | Admitting: Cardiovascular Disease

## 2016-04-05 ENCOUNTER — Ambulatory Visit (INDEPENDENT_AMBULATORY_CARE_PROVIDER_SITE_OTHER): Payer: Medicare Other | Admitting: Cardiovascular Disease

## 2016-04-05 VITALS — BP 110/70 | HR 69 | Ht 63.5 in | Wt 190.5 lb

## 2016-04-05 DIAGNOSIS — I10 Essential (primary) hypertension: Secondary | ICD-10-CM | POA: Diagnosis not present

## 2016-04-05 DIAGNOSIS — I482 Chronic atrial fibrillation, unspecified: Secondary | ICD-10-CM

## 2016-04-05 DIAGNOSIS — I5022 Chronic systolic (congestive) heart failure: Secondary | ICD-10-CM | POA: Diagnosis not present

## 2016-04-05 DIAGNOSIS — I2510 Atherosclerotic heart disease of native coronary artery without angina pectoris: Secondary | ICD-10-CM | POA: Diagnosis not present

## 2016-04-05 NOTE — Patient Instructions (Signed)
Medication Instructions: Continue same medications.   Labwork: None.   Procedures/Testing: None.   Follow-Up: 6 months with Dr. Arida.   Any Additional Special Instructions Will Be Listed Below (If Applicable).     If you need a refill on your cardiac medications before your next appointment, please call your pharmacy.   

## 2016-04-05 NOTE — Progress Notes (Signed)
Cardiology Office Note   Date:  04/05/2016   ID:  Kathy Lowery, DOB 31-Jul-1924, MRN [id number]  PCP:  Danella Penton, MD  Cardiologist:   Lorine Bears, MD   Chief Complaint  Patient presents with  . other    3 month follow up. Meds reviewed by the pt. verbally.       History of Present Illness: Kathy Lowery is a 83 y.o. female who presents for a follow-up visit regarding chronic systolic heart failure felt to be due to stress-induced cardiomyopathy, coronary artery disease and chronic atrial fibrillation.   She was hospitalized in February, 2017 for heart failure and non-ST elevation myocardial infarction.Echocardiogram showed an ejection fraction of 30-35% with wall motion abnormalities suggestive of an LAD infarct. I proceeded with cardiac catheterization which showed significant two-vessel coronary artery disease affecting the right coronary artery and left circumflex with no significant disease affecting the LAD. She had wall motion abnormality suggestive of stress-induced cardiomyopathy. She was treated medically.  Lower extremity arterial Doppler in October 2017 showed mildly reduced ABI on the left side at 0.89. She lives at [facility]. Her daughter Kathy Lowery is her power of attorney. She lives in [place].  She has been doing very well with no chest pain, palpitations or shortness of breath. She lost about 16 pounds since her last visit. She had labs done in January and these were reviewed. They were overall unremarkable including CBC and basic metabolic profile. She is planning to go visit her daughter in [place].    Past Medical History:  Diagnosis Date  . Arthritis   . CHF (congestive heart failure) (HCC)   . Chronic atrial fibrillation (HCC)   . Coronary artery disease    Non-ST elevation myocardial infarction in February 2017. Cardiac catheterization showed significant two-vessel coronary artery disease affecting the right coronary artery and  left circumflex with no significant disease involving the LAD. Ejection fraction was 30-35% with akinesis of the mid to distal and apical segments consistent with stress-induced cardiomyopathy.  Marland Kitchen HTN (hypertension)   . Hyperlipidemia   . MI (myocardial infarction)   . Peripheral neuralgia     Past Surgical History:  Procedure Laterality Date  . APPENDECTOMY    . CARDIAC CATHETERIZATION N/A 04/23/2015   Procedure: Left Heart Cath and Coronary Angiography;  Surgeon: Iran Ouch, MD;  Location: [facility] INVASIVE CV LAB;  Service: Cardiovascular;  Laterality: N/A;  . LUMBAR SPINE SURGERY       Current Outpatient Prescriptions  Medication Sig Dispense Refill  . acetaminophen-codeine (TYLENOL #3) 300-30 MG tablet Take 1 tablet by mouth every 4 (four) hours as needed for moderate pain. 30 tablet 0  . atorvastatin (LIPITOR) 40 MG tablet Take 1 tablet (40 mg total) by mouth daily at 6 PM. 30 tablet 0  . furosemide (LASIX) 20 MG tablet Take 1 tablet (20 mg total) by mouth every other day. 30 tablet 5  . gemfibrozil (LOPID) 600 MG tablet Take 600 mg by mouth 2 (two) times daily before a meal.    . losartan (COZAAR) 100 MG tablet Take 1 tablet (100 mg total) by mouth daily. 30 tablet 3  . metoprolol succinate (TOPROL-XL) 25 MG 24 hr tablet Take 1 tablet (25 mg total) by mouth daily. 90 tablet 3  . potassium chloride (K-DUR) 10 MEQ tablet Take 10 mEq by mouth daily.    Marland Kitchen spironolactone (ALDACTONE) 25 MG tablet Take 1 tablet (25 mg total) by mouth daily. 90 tablet  3  . warfarin (COUMADIN) 2 MG tablet Take 2 mg by mouth daily.     No current facility-administered medications for this visit.     Allergies:   Patient has no known allergies.    Social History:  The patient  reports that she has quit smoking. She quit after 16.00 years of use. She has never used smokeless tobacco. She reports that she does not drink alcohol or use drugs.   Family History:  The patient's family history includes CAD  in her father; Hypertension in her father; Tuberculosis in her mother.    ROS:  Please see the history of present illness.   Otherwise, review of systems are positive for none.   All other systems are reviewed and negative.    PHYSICAL EXAM: VS:  BP 110/70 (BP Location: Left Arm, Patient Position: Sitting, Cuff Size: Normal)   Pulse 69   Ht 5' 3.5" (1.613 m)   Wt 190 lb 8 oz (86.4 kg)   BMI 33.22 kg/m  , BMI Body mass index is 33.22 kg/m. GEN: Well nourished, well developed, in no acute distress  HEENT: normal  Neck: no  carotid bruits, or masses. Mild JVD Cardiac: Irregularly irregular; no murmurs, rubs, or gallops. +2 edema slightly worse on the right side with some redness Respiratory:  clear to auscultation bilaterally, normal work of breathing GI: soft, nontender, nondistended, + BS MS: no deformity or atrophy  Skin: warm and dry, no rash Neuro:  Strength and sensation are intact Psych: euthymic mood, full affect   EKG:  EKG is ordered today. The ekg ordered today demonstrates : Atrial fibrillation with ventricular rate of 68 bpm. Left bundle branch block.   Recent Labs: 04/19/2015: ALT 10; B Natriuretic Peptide 664.0 04/21/2015: Magnesium 1.9 04/25/2015: Hemoglobin 12.8; Platelets 211 11/26/2015: BUN 38; Creatinine, Ser 1.10; Potassium 4.8; Sodium 142    Lipid Panel No results found for: CHOL, TRIG, HDL, CHOLHDL, VLDL, LDLCALC, LDLDIRECT    Wt Readings from Last 3 Encounters:  04/05/16 190 lb 8 oz (86.4 kg)  12/29/15 207 lb 4 oz (94 kg)  11/26/15 203 lb 4 oz (92.2 kg)        ASSESSMENT AND PLAN:  1. Chronic systolic heart failure:  The patient seems to be now euvolemic on small dose furosemide 20 mg once daily. She had labs done last week which showed normal kidney function and electrolytes. Continue same medications.  2. Coronary artery disease involving native coronary arteries without angina:  I recommend continuing aggressive medical therapy. She currently  has no anginal symptoms.  3. Chronic atrial fibrillation: Ventricular rate is well controlled on small dose Toprol.  Continue anticoagulation with warfarin which is managed by Dr. Hyacinth MeekerMiller.  4. Hyperlipidemia: She is currently on both atorvastatin and gemfibrozil.  5. Essential hypertension:    Blood pressure is well controlled on current medications.   Disposition:   FU with me in 6 month .  Signed,  Lorine BearsMuhammad Arida, MD  04/05/2016 2:00 PM    [facility]

## 2016-06-15 IMAGING — CR DG FOOT COMPLETE 3+V*R*
1 series · 3 of 3 positions shown · non-contrast
Comparison: 07/11/2011

CLINICAL DATA: Right foot pain after twisting injury a few days
ago.

EXAM:
RIGHT FOOT COMPLETE - 3+ VIEW

[Series 1: x foot ap right · 0.14mm/px · 3 of 3 slices shown]
[im 1/3]
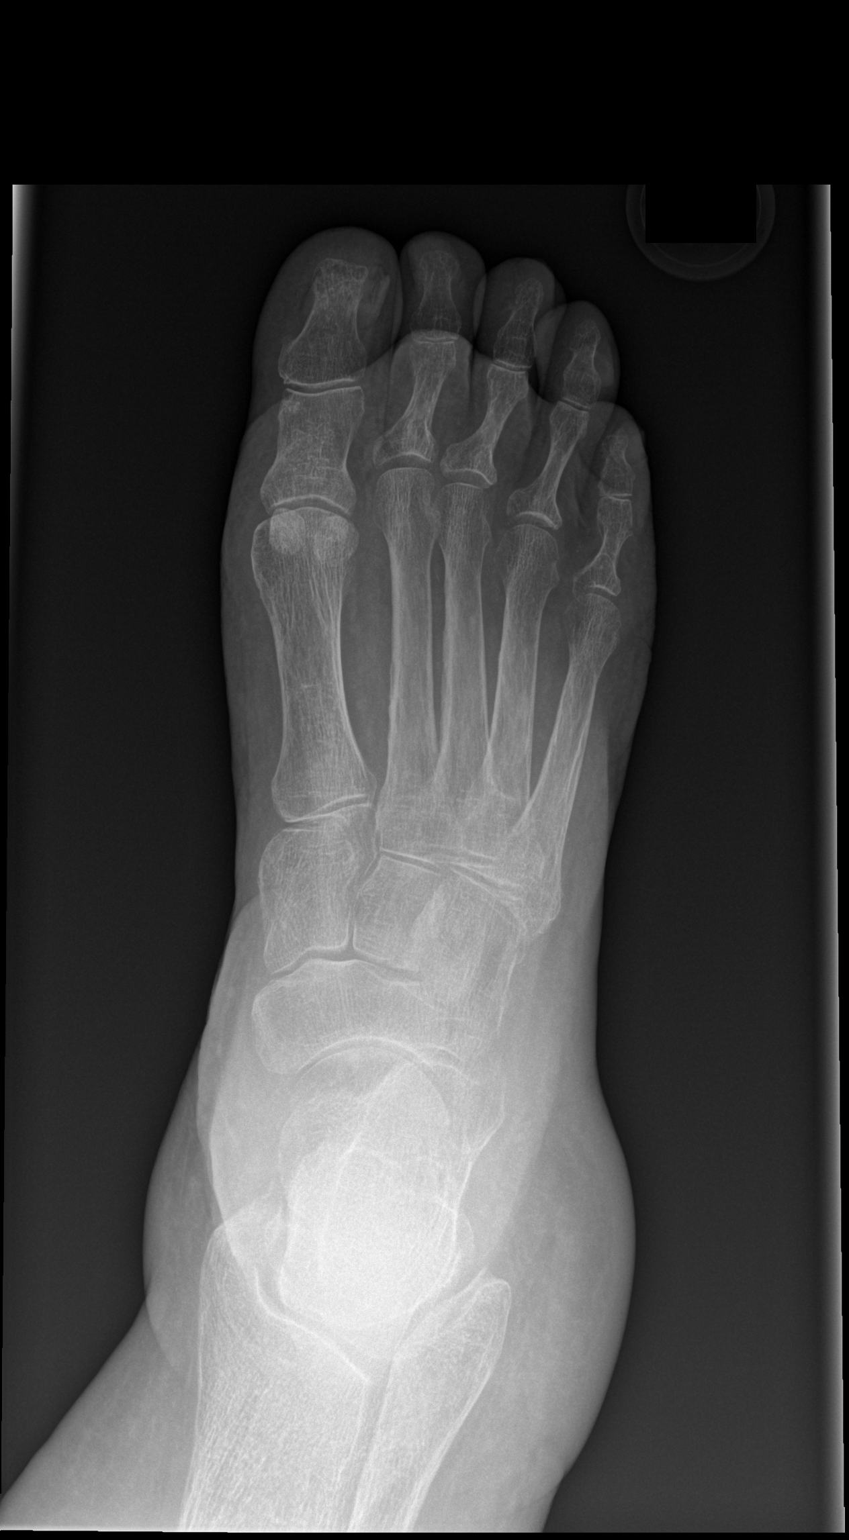
[im 2/3]
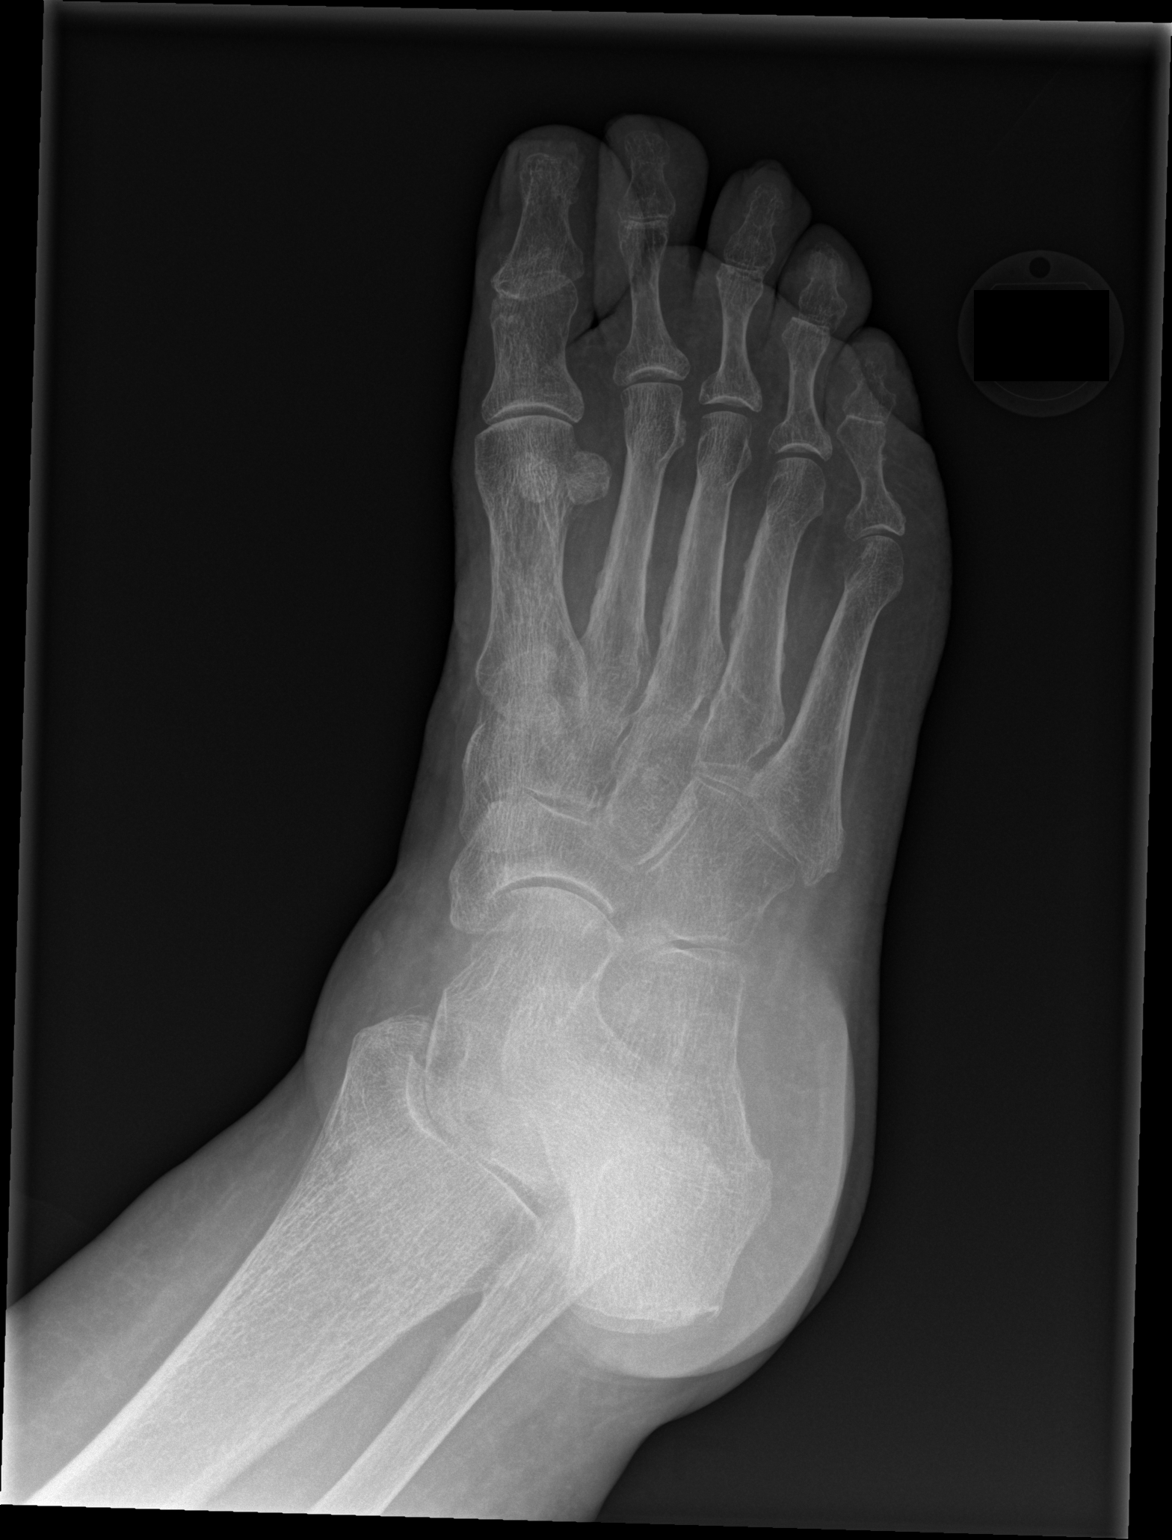
[im 3/3]
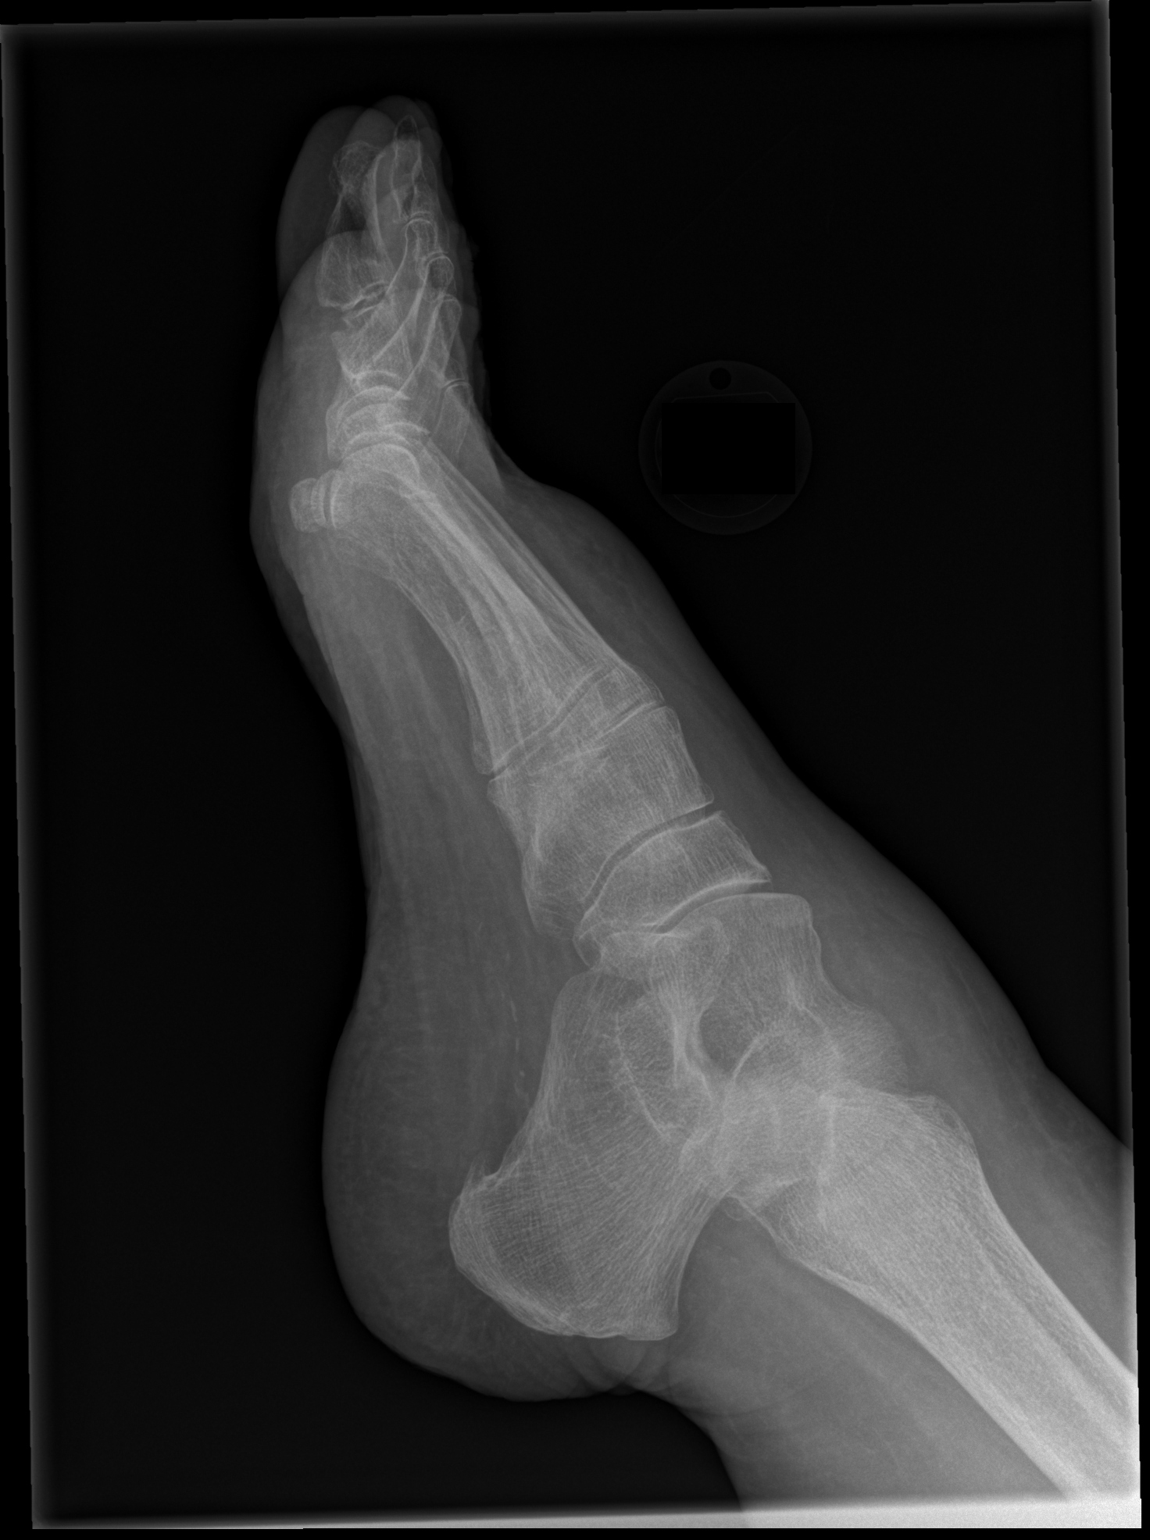

[3 of 3 positions shown; findings below may reference images not displayed]

FINDINGS: Diffuse bone demineralization. Right foot appears otherwise intact.
No evidence of acute fracture or subluxation. No focal bone lesion
or bone destruction. Bone cortex and trabecular architecture appear
intact. Soft tissue swelling over the dorsum of the right foot and
right ankle. Vascular calcifications. No radiopaque soft tissue
foreign bodies.
IMPRESSION: No acute bony abnormalities. Dorsal soft tissue swelling over the
right foot and ankle.

## 2016-08-21 ENCOUNTER — Emergency Department: Payer: Medicare Other

## 2016-08-21 ENCOUNTER — Emergency Department
Admission: EM | Admit: 2016-08-21 | Discharge: 2016-08-22 | Disposition: A | Discharge status: Skilled Nursing Facility | Payer: Medicare Other | Attending: Student in an Organized Health Care Education/Training Program | Admitting: Student in an Organized Health Care Education/Training Program

## 2016-08-21 DIAGNOSIS — I4891 Unspecified atrial fibrillation: Secondary | ICD-10-CM | POA: Insufficient documentation

## 2016-08-21 DIAGNOSIS — I252 Old myocardial infarction: Secondary | ICD-10-CM | POA: Insufficient documentation

## 2016-08-21 DIAGNOSIS — I509 Heart failure, unspecified: Secondary | ICD-10-CM | POA: Insufficient documentation

## 2016-08-21 DIAGNOSIS — Z7901 Long term (current) use of anticoagulants: Secondary | ICD-10-CM | POA: Diagnosis not present

## 2016-08-21 DIAGNOSIS — R531 Weakness: Secondary | ICD-10-CM

## 2016-08-21 DIAGNOSIS — Z87891 Personal history of nicotine dependence: Secondary | ICD-10-CM | POA: Diagnosis not present

## 2016-08-21 DIAGNOSIS — M6281 Muscle weakness (generalized): Secondary | ICD-10-CM | POA: Insufficient documentation

## 2016-08-21 DIAGNOSIS — I2510 Atherosclerotic heart disease of native coronary artery without angina pectoris: Secondary | ICD-10-CM | POA: Insufficient documentation

## 2016-08-21 DIAGNOSIS — Z79899 Other long term (current) drug therapy: Secondary | ICD-10-CM | POA: Insufficient documentation

## 2016-08-21 DIAGNOSIS — I10 Essential (primary) hypertension: Secondary | ICD-10-CM | POA: Diagnosis not present

## 2016-08-21 LAB — URINALYSIS, COMPLETE (UACMP) WITH MICROSCOPIC
BILIRUBIN URINE: NEGATIVE
Bacteria, UA: NONE SEEN
Glucose, UA: NEGATIVE mg/dL
HGB URINE DIPSTICK: NEGATIVE
Ketones, ur: NEGATIVE mg/dL
LEUKOCYTES UA: NEGATIVE
NITRITE: NEGATIVE
PH: 5 (ref 5.0–8.0)
Protein, ur: NEGATIVE mg/dL
Specific Gravity, Urine: 1.01 (ref 1.005–1.030)

## 2016-08-21 LAB — BASIC METABOLIC PANEL
Anion gap: 9 (ref 5–15)
BUN: 33 mg/dL — AB (ref 6–20)
CALCIUM: 8.6 mg/dL — AB (ref 8.9–10.3)
CO2: 23 mmol/L (ref 22–32)
CREATININE: 0.87 mg/dL (ref 0.44–1.00)
Chloride: 106 mmol/L (ref 101–111)
GFR calc Af Amer: >60 mL/min (ref >60)
GFR, EST NON AFRICAN AMERICAN: 57 mL/min — AB (ref >60)
GLUCOSE: 102 mg/dL — AB (ref 65–99)
Potassium: 3.9 mmol/L (ref 3.5–5.1)
SODIUM: 138 mmol/L (ref 135–145)

## 2016-08-21 LAB — PROTIME-INR
INR: 2.64
Prothrombin Time: 28.7 seconds — ABNORMAL HIGH (ref 11.4–15.2)

## 2016-08-21 LAB — CBC
HCT: 42.9 % (ref 35.0–47.0)
Hemoglobin: 14.7 g/dL (ref 12.0–16.0)
MCH: 31.4 pg (ref 26.0–34.0)
MCHC: 34.3 g/dL (ref 32.0–36.0)
MCV: 91.6 fL (ref 80.0–100.0)
PLATELETS: 226 10*3/uL (ref 150–440)
RBC: 4.68 MIL/uL (ref 3.80–5.20)
RDW: 14.4 % (ref 11.5–14.5)
WBC: 8.1 10*3/uL (ref 3.6–11.0)

## 2016-08-21 LAB — TROPONIN I: Troponin I: <0.03 ng/mL (ref <0.03)

## 2016-08-21 LAB — BRAIN NATRIURETIC PEPTIDE: B NATRIURETIC PEPTIDE 5: 130 pg/mL — AB (ref 0.0–100.0)

## 2016-08-21 MED ORDER — SODIUM CHLORIDE 0.9 % IV BOLUS (SEPSIS)
500.0000 mL | Freq: Once | INTRAVENOUS | Status: AC
  Administered 2016-08-21: 500 mL via INTRAVENOUS

## 2016-08-21 NOTE — ED Notes (Signed)
Patient transported to MRI 

## 2016-08-21 NOTE — ED Notes (Signed)
Pt in scans, will start IV fluids when pt returns.

## 2016-08-21 NOTE — ED Notes (Signed)
Pt arrive from [facility] out 45 minutes ago. EMS reports pt started to not feel well. States generalized weakness. Pt states she feels unsteady if she stands up, states she uses an assistive device. Denies dizziness, CP, SOB, N&V&D. Pt states she just feels unsteady. Denies HA or blurred vision. Denies room spinning. EMS reports pt had a "mild heart attack" 3 months ago. Pt is alert and oriented.

## 2016-08-21 NOTE — ED Provider Notes (Signed)
[facility] Emergency Department Provider Note    First MD Initiated Contact with Patient 08/21/16 1915     (approximate)  I have reviewed the triage vital signs and the nursing notes.   HISTORY  Chief Complaint Weakness    HPI Kathy Lowery is a 83 y.o. female with a history of chronic A. fib and CAD [facility] with brief episode of weakness that occurred roughly 45 minutes prior to arrival. Patient states that she was walking in her kitchen and just started feeling generalized weakness that she is about to pass out. She sat down to do feel weak for a few moments. States that it felt like she had a lack of energy. Denies any shortness of breath or chest pain during this episode. No nausea. No diaphoresis. Denied any lateralizing weakness. She did not fully lose consciousness. No recent medication changes. Patient states that they are moving her out of her current location and she's been very stressed about this and thinks that this could be causing some of her weakness.   Past Medical History:  Diagnosis Date  . Arthritis   . CHF (congestive heart failure) (HCC)   . Chronic atrial fibrillation (HCC)   . Coronary artery disease    Non-ST elevation myocardial infarction in February 2017. Cardiac catheterization showed significant two-vessel coronary artery disease affecting the right coronary artery and left circumflex with no significant disease involving the LAD. Ejection fraction was 30-35% with akinesis of the mid to distal and apical segments consistent with stress-induced cardiomyopathy.  Marland Kitchen. HTN (hypertension)   . Hyperlipidemia   . MI (myocardial infarction) (HCC)   . Peripheral neuralgia    Family History  Problem Relation Age of Onset  . CAD Father   . Hypertension Father   . Tuberculosis Mother    Past Surgical History:  Procedure Laterality Date  . APPENDECTOMY    . CARDIAC CATHETERIZATION N/A 04/23/2015     Procedure: Left Heart Cath and Coronary Angiography;  Surgeon: Iran OuchMuhammad A Arida, MD;  Location: [facility] INVASIVE CV LAB;  Service: Cardiovascular;  Laterality: N/A;  . LUMBAR SPINE SURGERY     Patient Active Problem List   Diagnosis Date Noted  . Chronic atrial fibrillation (HCC)   . Coronary artery disease   . NSTEMI (non-ST elevated myocardial infarction) (HCC) 04/22/2015  . Current use of long term anticoagulation 04/22/2015  . Essential hypertension 04/22/2015  . Obesity (BMI 30-39.9) 04/22/2015  . Hypokalemia 04/22/2015  . CHF (congestive heart failure) (HCC) 04/19/2015      Prior to Admission medications   Medication Sig Start Date End Date Taking? Authorizing Provider  acetaminophen-codeine (TYLENOL #3) 300-30 MG tablet Take 1 tablet by mouth every 4 (four) hours as needed for moderate pain. 05/02/15   Beers, Charmayne Sheerharles M, PA-C  atorvastatin (LIPITOR) 40 MG tablet Take 1 tablet (40 mg total) by mouth daily at 6 PM. 04/25/15   Auburn BilberryPatel, Shreyang, MD  furosemide (LASIX) 20 MG tablet Take 1 tablet (20 mg total) by mouth every other day. 11/03/15 04/05/16  Iran OuchArida, Muhammad A, MD  gemfibrozil (LOPID) 600 MG tablet Take 600 mg by mouth 2 (two) times daily before a meal.    [provider]  losartan (COZAAR) 100 MG tablet Take 1 tablet (100 mg total) by mouth daily. 10/23/15   Iran OuchArida, Muhammad A, MD  metoprolol succinate (TOPROL-XL) 25 MG 24 hr tablet Take 1 tablet (25 mg total) by mouth daily. 08/03/15  Iran Ouch, MD  potassium chloride (K-DUR) 10 MEQ tablet Take 10 mEq by mouth daily.    [provider]  spironolactone (ALDACTONE) 25 MG tablet Take 1 tablet (25 mg total) by mouth daily. 08/03/15   Iran Ouch, MD  warfarin (COUMADIN) 2 MG tablet Take 2 mg by mouth daily.    [provider]    Allergies Patient has no known allergies.    Social History Social History  Substance Use Topics  . Smoking status: Former Smoker    Years: 16.00  . Smokeless  tobacco: Never Used  . Alcohol use No    Review of Systems Patient denies headaches, rhinorrhea, blurry vision, numbness, shortness of breath, chest pain, edema, cough, abdominal pain, nausea, vomiting, diarrhea, dysuria, fevers, rashes or hallucinations unless otherwise stated above in HPI. ____________________________________________   PHYSICAL EXAM:  VITAL SIGNS: Vitals:   08/21/16 1914  Pulse: (!) 111  Resp: 15  Temp: 98 F (36.7 C)    Constitutional: Alert and oriented. Well appearing, much younger than stated age infact, in no acute distress. Eyes: Conjunctivae are normal.  Head: Atraumatic. Nose: No congestion/rhinnorhea. Mouth/Throat: Mucous membranes are moist.   Neck: No stridor. Painless ROM.  Cardiovascular: Normal rate, regular rhythm. Grossly normal heart sounds.  Good peripheral circulation. Respiratory: Normal respiratory effort.  No retractions. Lungs CTAB. Gastrointestinal: Soft and nontender. No distention. No abdominal bruits. No CVA tenderness. Musculoskeletal: No lower extremity tenderness,  1+ BLE edema.  No joint effusions. Neurologic:  Normal speech and language. No gross focal neurologic deficits are appreciated. No facial droop Skin:  Skin is warm, dry and intact. No rash noted. Psychiatric: Mood and affect are normal. Speech and behavior are normal.  ____________________________________________   LABS (all labs ordered are listed, but only abnormal results are displayed)  Results for orders placed or performed during the hospital encounter of 08/21/16 (from the past 24 hour(s))  Basic metabolic panel     Status: Abnormal   Collection Time: 08/21/16  7:12 PM  Result Value Ref Range   Sodium 138 135 - 145 mmol/L   Potassium 3.9 3.5 - 5.1 mmol/L   Chloride 106 101 - 111 mmol/L   CO2 23 22 - 32 mmol/L   Glucose, Bld 102 (H) 65 - 99 mg/dL   BUN 33 (H) 6 - 20 mg/dL   Creatinine, Ser 1.61 0.44 - 1.00 mg/dL   Calcium 8.6 (L) 8.9 - 10.3 mg/dL    GFR calc non Af Amer 57 (L) >60 mL/min   GFR calc Af Amer >60 >60 mL/min   Anion gap 9 5 - 15  CBC     Status: None   Collection Time: 08/21/16  7:12 PM  Result Value Ref Range   WBC 8.1 3.6 - 11.0 K/uL   RBC 4.68 3.80 - 5.20 MIL/uL   Hemoglobin 14.7 12.0 - 16.0 g/dL   HCT 09.6 04.5 - 40.9 %   MCV 91.6 80.0 - 100.0 fL   MCH 31.4 26.0 - 34.0 pg   MCHC 34.3 32.0 - 36.0 g/dL   RDW 81.1 91.4 - 78.2 %   Platelets 226 150 - 440 K/uL  Troponin I     Status: None   Collection Time: 08/21/16  7:12 PM  Result Value Ref Range   Troponin I <0.03 <0.03 ng/mL   ____________________________________________  EKG My review and personal interpretation at Time: 19:12   Indication: weakness  Rate: 70  Rhythm: afib Axis: left Other: lbbb, no  sgnificant changes as compared to previous 04/05/16 ____________________________________________  RADIOLOGY  I personally reviewed all radiographic images ordered to evaluate for the above acute complaints and reviewed radiology reports and findings.  These findings were personally discussed with the patient.  Please see medical record for radiology report.  ____________________________________________   PROCEDURES  Procedure(s) performed:  Procedures    Critical Care performed: no ____________________________________________   INITIAL IMPRESSION / ASSESSMENT AND PLAN / ED COURSE  Pertinent labs & imaging results that were available during my care of the patient were reviewed by me and considered in my medical decision making (see chart for details).  DDX: dehydration, stress, acs, chf, electrolyte abn, uti  Kathy Lowery is a 83 y.o. who presents to the ED with Chief complaint of generalized weakness and fatigue as described above. Patient appears well and without any focal neurodeficits. No evidence of congestive heart failure. Possible ACS the patient denies any chest pain or pressure. CT head ordered to evaluate for any evidence of ICH shows no  acute intracranial abnormalities. Based on her symptoms and 90s stay denies when standing will order MRI to further evaluate for any posterior circulation abnormality or evidence of CVA. I spoke with the patient's daughter who states that the symptoms have been recurrent over the past several weeks and not been able to isolate the cause of it. At this point, patient will be signed out to Dr. Lenard Lance pending results of MRI and urinalysis as well as repeat troponin. If negative I do feel patient will be stable for discharge back to [facility].  Have discussed with the patient and available family all diagnostics and treatments performed thus far and all questions were answered to the best of my ability. The patient demonstrates understanding and agreement with plan.       ____________________________________________   FINAL CLINICAL IMPRESSION(S) / ED DIAGNOSES  Final diagnoses:  Generalized weakness      NEW MEDICATIONS STARTED DURING THIS VISIT:  New Prescriptions   No medications on file     Note:  This document was prepared using Dragon voice recognition software and may include unintentional dictation errors.    Willy Eddy, MD 08/21/16 2050

## 2016-08-21 NOTE — ED Provider Notes (Signed)
-----------------------------------------   11:00 PM on 08/21/2016 -----------------------------------------  Patient's repeat troponin is negative. MRI is negative. Urinalysis is negative. Patient states she feels well. She believes she probably had an anxiety or stress responders to finding out that she would be in her new apartment for more than 3 weeks. As the patient appears very well I believe she is safe for discharge home. Patient currently lives at [facility].   Minna AntisPaduchowski, Raad Clayson, MD 08/21/16 [phone]

## 2016-08-21 NOTE — ED Notes (Signed)
Pt has talked to MRI tech on phone. Kathy CornfieldStephanie [phone]

## 2016-08-21 NOTE — ED Notes (Signed)
Pt placed on bedpan to urinate. Cleaned and brief placed back on. Chuck placed under pt.

## 2016-10-18 ENCOUNTER — Ambulatory Visit: Payer: Medicare Other | Admitting: Cardiovascular Disease

## 2017-08-19 IMAGING — CT CT HEAD W/O CM
4 series · 16 of 47 positions shown, 18 images · non-contrast
Comparison: None.

CLINICAL DATA: Generalized weakness.

EXAM:
CT HEAD WITHOUT CONTRAST
TECHNIQUE: Contiguous axial images were obtained from the base of the skull
through the vertex without intravenous contrast.

[Series 2: head wo · axial · 0.41mm/px · z∈[+1178,+1278]mm · 6 of 28 slices shown, 8 images]
[im 4/28  brain]
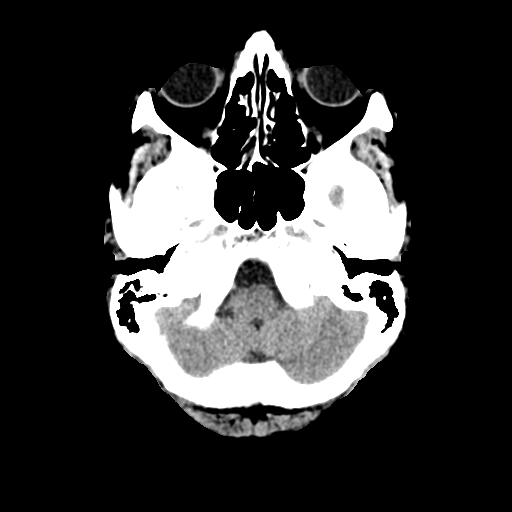
[im 4/28  bone]
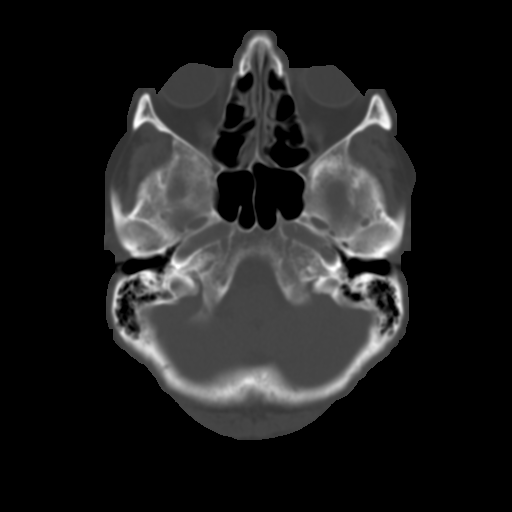
[im 8/28  brain]
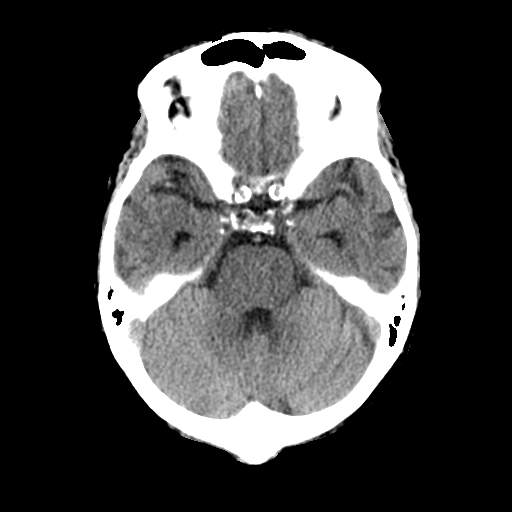
[im 12/28  brain]
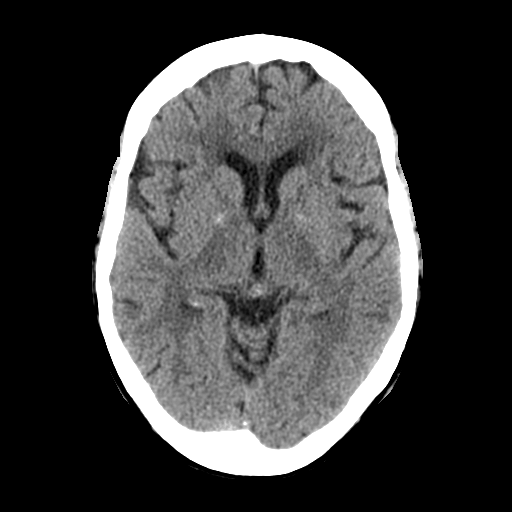
[im 16/28  brain]
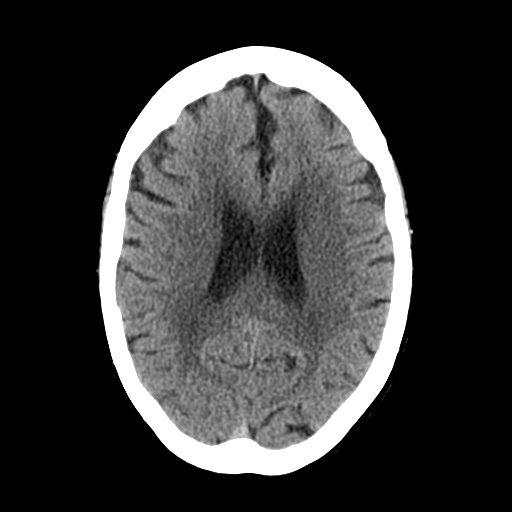
[im 20/28  brain]
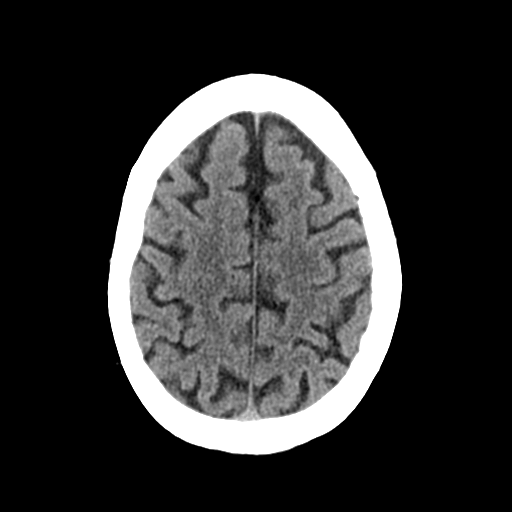
[im 20/28  bone]
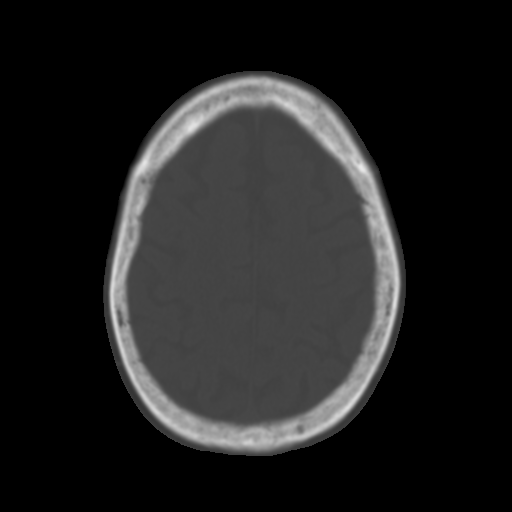
[im 24/28  brain]
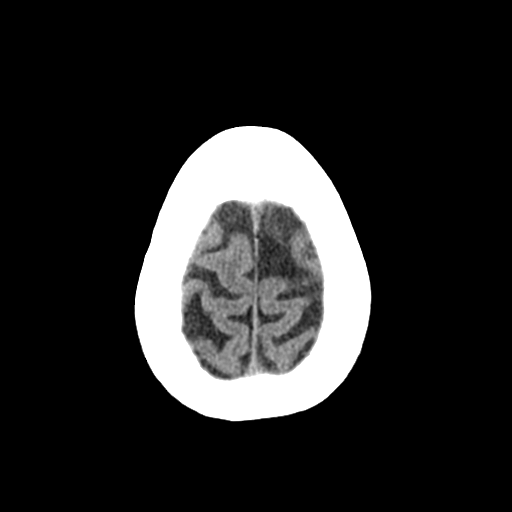

[Series 3: head bone · axial · 0.41mm/px · z∈[+1175,+1223]mm · 4 of 73 slices shown]
[im 7/73  bone]
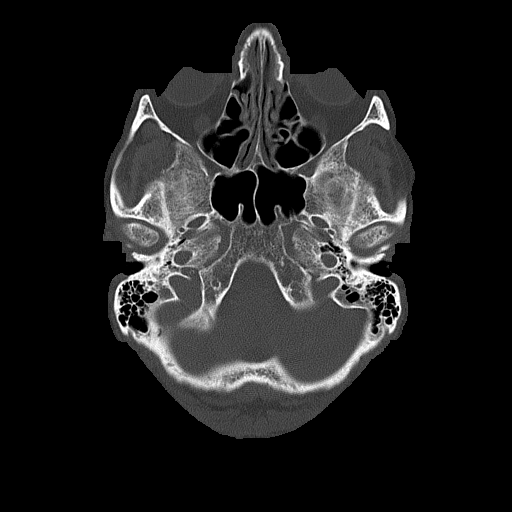
[im 14/73  bone]
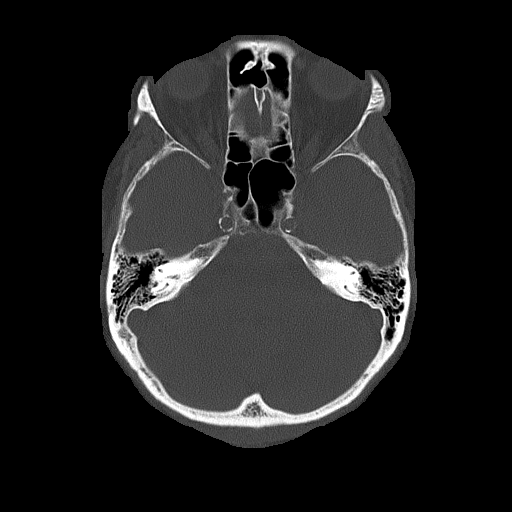
[im 25/73  bone]
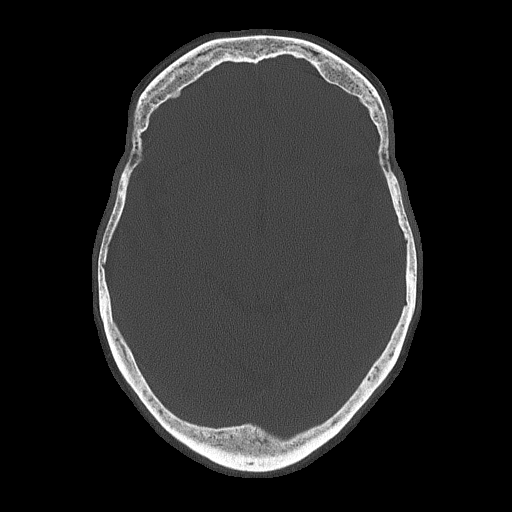
[im 31/73  bone]
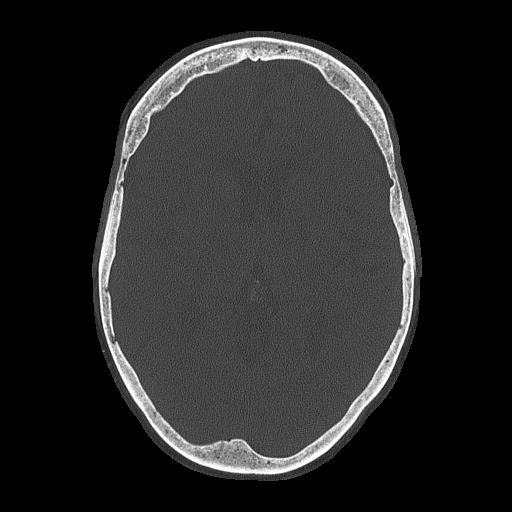

[Series 4: coronal soft tissue · coronal · 0.29mm/px · 3 of 61 slices shown]
[im 21/61  brain]
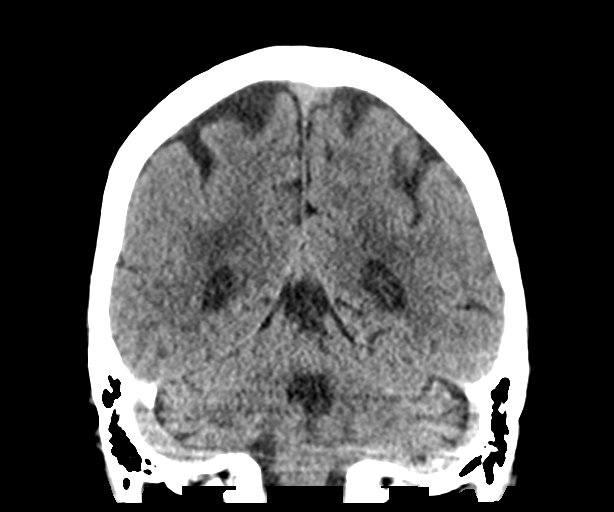
[im 27/61  brain]
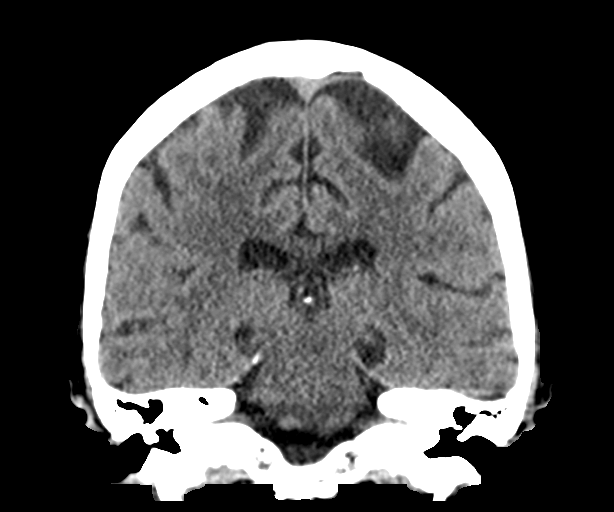
[im 34/61  brain]
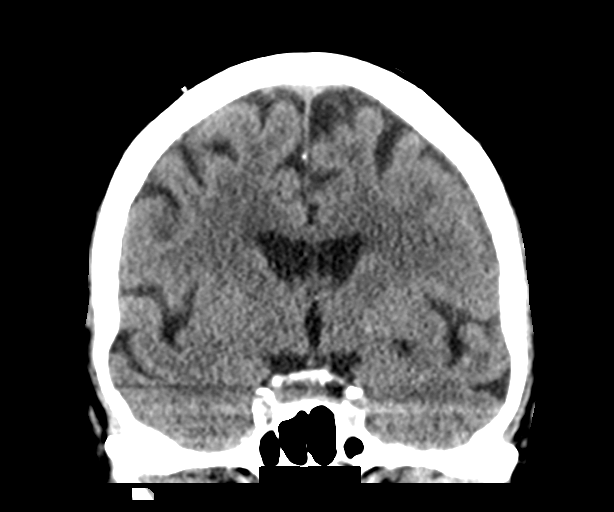

[Series 5: sagittal soft tissue · sagittal · 0.29mm/px · 3 of 47 slices shown]
[im 16/47  brain]
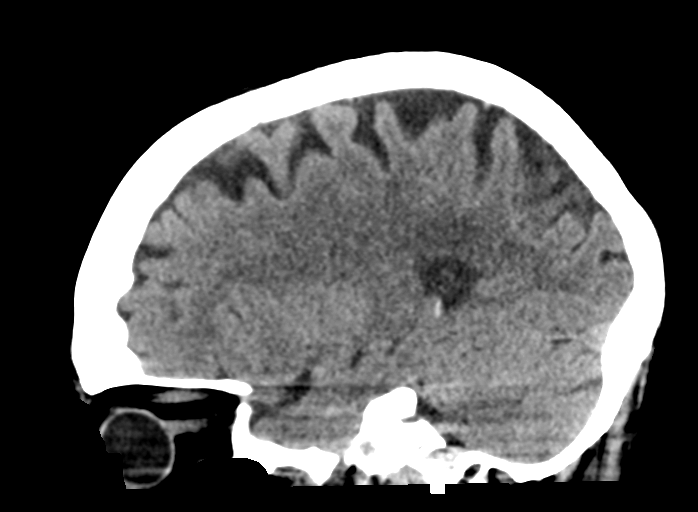
[im 24/47  brain]
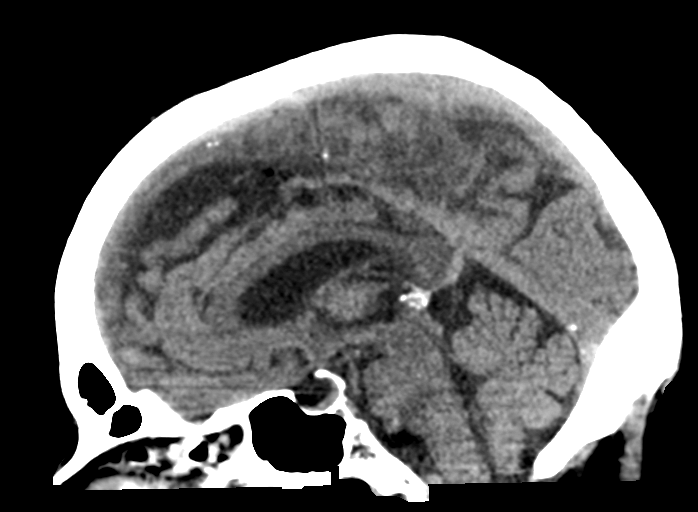
[im 31/47  brain]
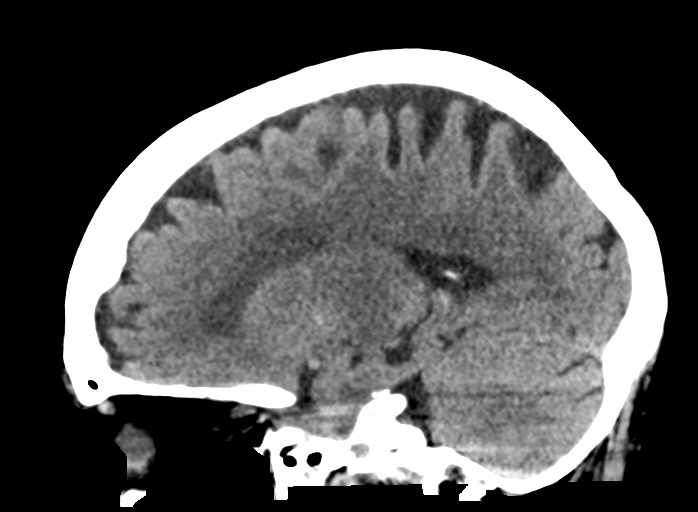

[16 of 47 positions shown; findings below may reference images not displayed]

FINDINGS: Brain: No subdural, epidural, or subarachnoid hemorrhage.
Cerebellum, brainstem, and basal cisterns are normal. No mass, mass
effect, or midline shift. Ventricles and sulci are unremarkable.
Scattered white matter changes. No acute cortical ischemia or
infarct.

Vascular: Calcified atherosclerosis is seen in the intracranial
carotid arteries.

Skull: Normal. Negative for fracture or focal lesion.

Sinuses/Orbits: No acute finding.

Other: None.
IMPRESSION: No acute intracranial abnormality identified to explain the
patient's symptoms.

## 2018-04-26 DIAGNOSIS — I4891 Unspecified atrial fibrillation: Secondary | ICD-10-CM | POA: Insufficient documentation

## 2020-04-15 ENCOUNTER — Telehealth: Payer: Medicare Other | Admitting: Cardiovascular Disease

## 2020-04-15 NOTE — Telephone Encounter (Signed)
Patient has been contacted at least 3 times for a recall, recall has been removed 

## 2021-11-22 DIAGNOSIS — R532 Functional quadriplegia: Secondary | ICD-10-CM | POA: Insufficient documentation

## 2022-11-23 ENCOUNTER — Encounter: Payer: Self-pay | Admitting: Student

## 2022-11-23 ENCOUNTER — Ambulatory Visit: Payer: Medicare Other | Admitting: Student

## 2022-11-23 VITALS — BP 134/76 | HR 56 | Temp 98.2°F | Wt 174.0 lb

## 2022-11-23 DIAGNOSIS — B370 Candidal stomatitis: Secondary | ICD-10-CM | POA: Diagnosis not present

## 2022-11-23 DIAGNOSIS — I509 Heart failure, unspecified: Secondary | ICD-10-CM

## 2022-11-23 DIAGNOSIS — R532 Functional quadriplegia: Secondary | ICD-10-CM

## 2022-11-23 DIAGNOSIS — R54 Age-related physical debility: Secondary | ICD-10-CM

## 2022-11-23 MED ORDER — NYSTATIN 100000 UNIT/ML MT SUSP
5.0000 mL | Freq: Four times a day (QID) | OROMUCOSAL | 0 refills | Status: AC
Start: 2022-11-23 — End: 2022-12-03

## 2022-11-23 NOTE — Progress Notes (Unsigned)
[facility].   Provider: Dr. Earnestine Mealing  Code Status: (patient not sure) Goals of Care:     08/21/2016    7:15 PM  Advanced Directives  Does Patient Have a Medical Advance Directive? Yes  Type of Advance Directive Out of facility DNR (pink MOST or yellow form)     Chief Complaint  Patient presents with  . Acute Visit    Thrush in mouth for a couple of weeks.     HPI: Patient is a 89 y.o. female seen today for an acute visit for Thrush in mouth for weeks.   She has had this in her mouth for a few weeks. No pain in her mouth. Primarily irritation in the tongue. It has felt raw for her.   She takes shower. She cookes her own meals.   Her husband liked playing golf and he had a night job as well. He worked for [employer] as a Hospital doctor.   Once a month she has someone come help clean her house.   She is originally from [place]. She has lived at [facility] for [facility] years. She worked at [employer]. She worked at night because she had 4 kids and she had a MIL that helped with hte ids at night.   No falls in the last year. She slid off the chair once when she reached for something and she slid off the chair. She had someone help with her getting back up.   She needs a new wheelchair. Patient states she would like to transition primary care to me here on campus.  Past Medical History:  Diagnosis Date  . Arthritis   . CHF (congestive heart failure) (HCC)   . Chronic atrial fibrillation (HCC)   . Coronary artery disease    Non-ST elevation myocardial infarction in February 2017. Cardiac catheterization showed significant two-vessel coronary artery disease affecting the right coronary artery and left circumflex with no significant disease involving the LAD. Ejection fraction was 30-35% with akinesis of the mid to distal and apical segments consistent with stress-induced cardiomyopathy.  Marland Kitchen HTN (hypertension)   . Hyperlipidemia   . MI (myocardial infarction) (HCC)    . Peripheral neuralgia     Past Surgical History:  Procedure Laterality Date  . APPENDECTOMY    . CARDIAC CATHETERIZATION N/A 04/23/2015   Procedure: Left Heart Cath and Coronary Angiography;  Surgeon: Iran Ouch, MD;  Location: [facility] INVASIVE CV LAB;  Service: Cardiovascular;  Laterality: N/A;  . LUMBAR SPINE SURGERY      No Known Allergies  Outpatient Encounter Medications as of 11/23/2022  Medication Sig  . atorvastatin (LIPITOR) 40 MG tablet Take 1 tablet (40 mg total) by mouth daily at 6 PM.  . ferrous gluconate (FERGON) 324 MG tablet Take 324 mg by mouth daily with breakfast.  . furosemide (LASIX) 20 MG tablet Take 20 mg by mouth daily.  Marland Kitchen gemfibrozil (LOPID) 600 MG tablet Take 600 mg by mouth 2 (two) times daily before a meal.  . omeprazole (PRILOSEC) 40 MG capsule Take 40 mg by mouth daily.  . potassium chloride (K-DUR) 10 MEQ tablet Take 10 mEq by mouth daily.  . propranolol (INDERAL) 20 MG tablet Take 20 mg by mouth daily.  . sertraline (ZOLOFT) 25 MG tablet Take 25 mg by mouth daily.  Marland Kitchen spironolactone (ALDACTONE) 25 MG tablet Take 1 tablet (25 mg total) by mouth daily.  Marland Kitchen warfarin (COUMADIN) 1 MG tablet Take One and a Half (1.5mg ) tablet  by mouth once daily.  . [DISCONTINUED] acetaminophen-codeine (TYLENOL #3) 300-30 MG tablet Take 1 tablet by mouth every 4 (four) hours as needed for moderate pain. (Patient not taking: Reported on 08/21/2016)  . [DISCONTINUED] furosemide (LASIX) 20 MG tablet Take 1 tablet (20 mg total) by mouth every other day.  . [DISCONTINUED] losartan (COZAAR) 100 MG tablet Take 1 tablet (100 mg total) by mouth daily. (Patient not taking: Reported on 08/21/2016)  . [DISCONTINUED] metoprolol succinate (TOPROL-XL) 25 MG 24 hr tablet Take 1 tablet (25 mg total) by mouth daily.  . [DISCONTINUED] warfarin (COUMADIN) 2 MG tablet Take 2 mg by mouth daily.   No facility-administered encounter medications on file as of 11/23/2022.    Review of Systems:   Review of Systems  Health Maintenance  Topic Date Due  . Medicare Annual Wellness (AWV)  Never done  . DTaP/Tdap/Td (1 - Tdap) Never done  . Zoster Vaccines- Shingrix (1 of 2) 05/23/1976  . DEXA SCAN  Never done  . Pneumonia Vaccine 14+ Years old (2 of 2 - PCV) 04/02/2014  . INFLUENZA VACCINE  09/29/2022  . COVID-19 Vaccine (4 - 2023-24 season) 10/30/2022  . HPV VACCINES  Aged Out    Physical Exam: Vitals:   11/23/22 1459  BP: 134/76  Pulse: (!) 56  Temp: 98.2 F (36.8 C)  SpO2: 93%  Weight: 174 lb (78.9 kg)   Body mass index is 30.34 kg/m. Physical Exam  Labs reviewed: Basic Metabolic Panel: No results for input(s): "NA", "K", "CL", "CO2", "GLUCOSE", "BUN", "CREATININE", "CALCIUM", "MG", "PHOS", "TSH" in the last 8760 hours. Liver Function Tests: No results for input(s): "AST", "ALT", "ALKPHOS", "BILITOT", "PROT", "ALBUMIN" in the last 8760 hours. No results for input(s): "LIPASE", "AMYLASE" in the last 8760 hours. No results for input(s): "AMMONIA" in the last 8760 hours. CBC: No results for input(s): "WBC", "NEUTROABS", "HGB", "HCT", "MCV", "PLT" in the last 8760 hours. Lipid Panel: No results for input(s): "CHOL", "HDL", "LDLCALC", "TRIG", "CHOLHDL", "LDLDIRECT" in the last 8760 hours. No results found for: "HGBA1C"  Procedures since last visit: No results found.  Assessment/Plan There are no diagnoses linked to this encounter.   Labs/tests ordered:  * No order type specified * Next appt:  11/30/2022

## 2022-11-23 NOTE — Patient Instructions (Signed)
Please use the mouth wash morning, noon, dinner, and at night. For the next 10 days.

## 2022-11-24 ENCOUNTER — Encounter: Payer: Self-pay | Admitting: Student

## 2022-11-30 ENCOUNTER — Ambulatory Visit: Payer: Self-pay | Admitting: Student

## 2022-12-07 ENCOUNTER — Encounter: Payer: Self-pay | Admitting: Student

## 2022-12-07 ENCOUNTER — Ambulatory Visit: Payer: Medicare Other | Admitting: Student

## 2022-12-07 VITALS — BP 138/88 | HR 82 | Temp 98.0°F | Ht 63.5 in | Wt 174.0 lb

## 2022-12-07 DIAGNOSIS — B351 Tinea unguium: Secondary | ICD-10-CM | POA: Insufficient documentation

## 2022-12-07 DIAGNOSIS — G3184 Mild cognitive impairment, so stated: Secondary | ICD-10-CM

## 2022-12-07 DIAGNOSIS — I4892 Unspecified atrial flutter: Secondary | ICD-10-CM

## 2022-12-07 DIAGNOSIS — I872 Venous insufficiency (chronic) (peripheral): Secondary | ICD-10-CM | POA: Diagnosis not present

## 2022-12-07 DIAGNOSIS — E782 Mixed hyperlipidemia: Secondary | ICD-10-CM

## 2022-12-07 DIAGNOSIS — I5033 Acute on chronic diastolic (congestive) heart failure: Secondary | ICD-10-CM | POA: Insufficient documentation

## 2022-12-07 DIAGNOSIS — I10 Essential (primary) hypertension: Secondary | ICD-10-CM

## 2022-12-07 DIAGNOSIS — Z7901 Long term (current) use of anticoagulants: Secondary | ICD-10-CM

## 2022-12-07 DIAGNOSIS — I509 Heart failure, unspecified: Secondary | ICD-10-CM

## 2022-12-07 DIAGNOSIS — I739 Peripheral vascular disease, unspecified: Secondary | ICD-10-CM | POA: Insufficient documentation

## 2022-12-07 DIAGNOSIS — F03B0 Unspecified dementia, moderate, without behavioral disturbance, psychotic disturbance, mood disturbance, and anxiety: Secondary | ICD-10-CM | POA: Insufficient documentation

## 2022-12-07 DIAGNOSIS — R739 Hyperglycemia, unspecified: Secondary | ICD-10-CM

## 2022-12-07 DIAGNOSIS — I4891 Unspecified atrial fibrillation: Secondary | ICD-10-CM | POA: Diagnosis not present

## 2022-12-07 MED ORDER — CLOTRIMAZOLE-BETAMETHASONE 1-0.05 % EX CREA
1.0000 | TOPICAL_CREAM | Freq: Every day | CUTANEOUS | 0 refills | Status: DC
Start: 2022-12-07 — End: 2023-04-01

## 2022-12-07 NOTE — Patient Instructions (Signed)
A phlebotomist will come to your home on Monday to get your blood collection.  I have sent a refill for your leg cream.  You are do for a number of vaccinations, please get them at your convenience: Tetanus Shingles PCV 20 Flu Vaccine  Covid Booster

## 2022-12-07 NOTE — Progress Notes (Signed)
[facility] clinic [facility].   Provider: Dr. Earnestine Mealing  Code Status: DNR Goals of Care:     12/07/2022    2:30 PM  Advanced Directives  Does Patient Have a Medical Advance Directive? Yes  Type of Estate agent of [place];Out of facility DNR (pink MOST or yellow form);Living will  Does patient want to make changes to medical advance directive? No - Patient declined  Copy of Healthcare Power of Attorney in Chart? No - copy requested     Chief Complaint  Patient presents with   Establish Care    NP to establish. Victorino Dike with [facility] dropped off a request for OT to eval and treat to assess for appropriate wheelchair.    Quality Metric Gaps    To discuss need for Zoster, Pneumonia, Flu, Covid , Tdap and Dexascan.     HPI: Patient is a 89 y.o. female seen today to Establish  Medical Conditions:  CHF-denies trouble breathing chest pain or shortness of breath. A fib - on warfarin - sub therapeutic 6 months ago.  Chronic venous stasis changes-needs refill of cream for her legs. No history of stroke.   Medications: she has her medications on a calendar and uses a pill pack to use in her home. They are delivered every Wednesday.   Mobility- She primarily uses the wheelchair while in the house. She self-transfers. She has not had any falls. She slid off of the chair and steve responded using her life-alert. Another person picked her up. This was quite a while ago. She physical therapy ordered. She has had this wheelchair for 20 years.   Mentation - SLUMS 19/30.   Matters most: independence   Social and Functional History:  She met her husband December 31 when she was 61 years old. They had 50 years together. He died at 89 years old.   During the day she keeps a book of word searches, she has trouble finding that word. She read harelquin romance novels. Her daughter takes care of her groceries. She calls [employer]. Her daughter will be 12 on 10/16. She puts hr  groceries away herself. She cooks her meals. She also has meals that she puts in the microwave. She also cooks on the stove at times.  She gets her hair done weekly and cleaning person weekly. Sh has stopped doing that since they retired every 2 weeks. She got rid of her tub before moving in so she has a shower with hand rales. She has an aid that comes as well who makes sure she has someone present with her for her appointments. She is incontinent of urine and wears depends all the time.  Banana and cereal for breakfast. Cup of furit and cereal.    Spoke with patient's daughter/HCPOA, Khris.  Discussed concern for inadequate anticoagulation.  She has been fully wheelchair dependent for the last 3-4 years. She spends her life in the recliner since her husband died 24 years ago. Wheels herself everywhere. She is bruising her legs on the wheelchair. There are 4 children-- only talk to Khris and brother. She has been managing her bills for 4 years and primarily because of her memory. If she were to get sick, hospitalization would be palliative only. No major interventions. It would only be to make sure she is comfortable. She is only allowed to communicate with. There is a sister Olegario Messier and Brother.    Past Medical History:  Diagnosis Date   Arthritis  CHF (congestive heart failure) (HCC)    Chronic atrial fibrillation (HCC)    Coronary artery disease    Non-ST elevation myocardial infarction in February 2017. Cardiac catheterization showed significant two-vessel coronary artery disease affecting the right coronary artery and left circumflex with no significant disease involving the LAD. Ejection fraction was 30-35% with akinesis of the mid to distal and apical segments consistent with stress-induced cardiomyopathy.   HTN (hypertension)    Hyperlipidemia    MI (myocardial infarction) [facility])    Peripheral neuralgia     Past Surgical History:  Procedure Laterality Date   APPENDECTOMY     CARDIAC  CATHETERIZATION N/A 04/23/2015   Procedure: Left Heart Cath and Coronary Angiography;  Surgeon: Iran Ouch, MD;  Location: [facility] INVASIVE CV LAB;  Service: Cardiovascular;  Laterality: N/A;   LUMBAR SPINE SURGERY      No Known Allergies  Outpatient Encounter Medications as of 12/07/2022  Medication Sig   atorvastatin (LIPITOR) 40 MG tablet Take 1 tablet (40 mg total) by mouth daily at 6 PM.   clotrimazole-betamethasone (LOTRISONE) cream Apply 1 Application topically daily.   ferrous gluconate (FERGON) 324 MG tablet Take 324 mg by mouth daily with breakfast.   furosemide (LASIX) 20 MG tablet Take 20 mg by mouth daily.   gemfibrozil (LOPID) 600 MG tablet Take 600 mg by mouth 2 (two) times daily before a meal.   omeprazole (PRILOSEC) 40 MG capsule Take 40 mg by mouth daily.   potassium chloride (K-DUR) 10 MEQ tablet Take 10 mEq by mouth daily.   propranolol (INDERAL) 20 MG tablet Take 20 mg by mouth daily.   sertraline (ZOLOFT) 25 MG tablet Take 25 mg by mouth daily.   spironolactone (ALDACTONE) 25 MG tablet Take 1 tablet (25 mg total) by mouth daily.   warfarin (COUMADIN) 1 MG tablet Take One and a Half (1.5mg ) tablet by mouth once daily.   No facility-administered encounter medications on file as of 12/07/2022.    Review of Systems:  Review of Systems  Health Maintenance  Topic Date Due   DTaP/Tdap/Td (1 - Tdap) Never done   Zoster Vaccines- Shingrix (1 of 2) 05/23/1976   DEXA SCAN  Never done   Pneumonia Vaccine 14+ Years old (2 of 2 - PCV) 04/02/2014   INFLUENZA VACCINE  09/29/2022   COVID-19 Vaccine (4 - 2023-24 season) 10/30/2022   Medicare Annual Wellness (AWV)  12/07/2023   HPV VACCINES  Aged Out    Physical Exam: Vitals:   12/07/22 1424  BP: 138/88  Pulse: 82  Temp: 98 F (36.7 C)  SpO2: 95%  Weight: 174 lb (78.9 kg)  Height: 5' 3.5" (1.613 m)   Body mass index is 30.34 kg/m. Physical Exam Constitutional:      Appearance: She is obese.     Comments:  wheelchair  Cardiovascular:     Rate and Rhythm: Normal rate.     Pulses: Normal pulses.  Pulmonary:     Effort: Pulmonary effort is normal.     Breath sounds: Normal breath sounds.  Abdominal:     General: Abdomen is flat.     Palpations: Abdomen is soft.  Skin:    General: Skin is warm and dry.     Comments: Thickened, hyperkeratotic toenails.  Neurological:     Mental Status: She is alert and oriented to person, place, and time.     Labs reviewed: Basic Metabolic Panel: No results for input(s): "NA", "K", "CL", "CO2", "GLUCOSE", "BUN", "CREATININE", "CALCIUM", "MG", "  PHOS", "TSH" in the last 8760 hours. Liver Function Tests: No results for input(s): "AST", "ALT", "ALKPHOS", "BILITOT", "PROT", "ALBUMIN" in the last 8760 hours. No results for input(s): "LIPASE", "AMYLASE" in the last 8760 hours. No results for input(s): "AMMONIA" in the last 8760 hours. CBC: No results for input(s): "WBC", "NEUTROABS", "HGB", "HCT", "MCV", "PLT" in the last 8760 hours. Lipid Panel: No results for input(s): "CHOL", "HDL", "LDLCALC", "TRIG", "CHOLHDL", "LDLDIRECT" in the last 8760 hours. No results found for: "HGBA1C"  Procedures since last visit: No results found.  Assessment/Plan Atrial fibrillation and flutter (HCC) - Plan: Do not attempt resuscitation (DNR), Protime-INR  Congestive heart failure, unspecified HF chronicity, unspecified heart failure type (HCC)  Venous stasis dermatitis - Plan: clotrimazole-betamethasone (LOTRISONE) cream  Current use of long term anticoagulation - Plan: Protime-INR  Hyperlipidemia, mixed - Plan: Lipid Panel  Medicare annual wellness visit, initial  Essential hypertension - Plan: CBC With Differential/Platelet, Complete Metabolic Panel with eGFR, TSH  Hyperglycemia - Plan: Hemoglobin A1C  Acute on chronic diastolic congestive heart failure (HCC)  PAD (peripheral artery disease) (HCC)  Mycotic toenails 1. Atrial fibrillation and flutter  [facility]) Patient has had longstanding anticoagulation with warfarin.  Most recent INR 1.7 which is subtherapeutic greater than 6 months ago.  Discussed at length with patient and her daughter concern that this subtherapeutic anticoagulation could increase her risk of stroke given her longstanding atrial fibrillation and atrial flutter.  Both patient and daughter Avon Gully) understand his concern and are agreeable to a trial of a different medication for anticoagulation such as Eliquis or Xarelto.  Will message pharmacist to determine the financial requirements for either these medications and determine what is most feasible for patient.  CBC and INR ordered for Monday home collection given patient's transportation limitations.  2. Congestive heart failure, unspecified HF chronicity, unspecified heart failure type (HCC) History of CHF on chronic Lasix.  Nonpitting edema bilaterally with skin changes.  Labs ordered for Monday for evaluation.  Continue goal-directed therapy with spironolactone.  Inderal is typically not the beta-blocker used for goal-directed therapy, will continue conversation and follow-up.  3. Venous stasis dermatitis Patient with some erythema and skin thinning of the bilateral legs.  Will refill treatment cream to prevent worsening of current state.  4. Current use of long term anticoagulation Patient is currently on warfarin concerned that this is subtherapeutic, follow-up on INR.  Follow-up CBC patient has been on iron we will determine need for iron supplementation.  5. Hyperlipidemia, mixed Lipids 6 months ago repeat Monday.  7. Essential hypertension Blood pressure at goal at this time.  Continue Lasix and spironolactone.  8. Hyperglycemia Will collect A1c at this time.  9. Mild Cognitive Impairment Patient with a slums score of 19 out of 30 today.  Over the last 4 years patient has had significant functional decline however lives independently.  She showers independently,  dresses independently, transfers independently, cooks and cleans kitchen independently.  She does rely on her adult daughter for IADLs such as coordinating groceries, paying bills.  Patient is able to coordinate some of her care such as transportation to the clinic however she does have a caregiver who drives her to and from places.  All of these changes have happened within the last 4 years.  Discussed goals of care she does remain DNR.  She is okay going to the hospital but would not want any invasive procedures would only want comfort based measures.  They want to do what can be done to support  her independent living rather than transferring to higher levels of care.  Will continue goals of care conversations.  Patient is not a candidate for any medications at this time.  Patient is currently on sertraline 25 mg however this is subtherapeutic dosing.  Will continue to evaluate for mood at follow-up appointment.  10. PAD (peripheral artery disease) (HCC) Patient without pain or claudication at this time.  Continue atorvastatin.  11. Mycotic toenails Patient's last appointment was 06-03-2022 with podiatry for mycotic toenails.  Discussed concern that patient needs this addressed soon.  Advise she schedule appointment or get approval to be seen with podiatry on campus with her next appointment.   Labs/tests ordered:  * No order type specified * Next appt:  01/11/2023   I spent greater than 75  minutes for the care of this patient in face to face time, chart review, clinical documentation, patient education. I spent an additional 30 minutes discussing goals of care and advanced care planning.

## 2022-12-13 LAB — COMPLETE METABOLIC PANEL WITH GFR
AG Ratio: 1.2 (calc) (ref 1.0–2.5)
ALT: 5 U/L — ABNORMAL LOW (ref 6–29)
AST: 14 U/L (ref 10–35)
Albumin: 3.6 g/dL (ref 3.6–5.1)
Alkaline phosphatase (APISO): 159 U/L — ABNORMAL HIGH (ref 37–153)
BUN/Creatinine Ratio: 30 (calc) — ABNORMAL HIGH (ref 6–22)
BUN: 26 mg/dL — ABNORMAL HIGH (ref 7–25)
CO2: 28 mmol/L (ref 20–32)
Calcium: 9.3 mg/dL (ref 8.6–10.4)
Chloride: 104 mmol/L (ref 98–110)
Creat: 0.86 mg/dL (ref 0.60–0.95)
Globulin: 2.9 g/dL (ref 1.9–3.7)
Glucose, Bld: 91 mg/dL (ref 65–99)
Potassium: 4.3 mmol/L (ref 3.5–5.3)
Sodium: 140 mmol/L (ref 135–146)
Total Bilirubin: 0.5 mg/dL (ref 0.2–1.2)
Total Protein: 6.5 g/dL (ref 6.1–8.1)
eGFR: 62 mL/min/{1.73_m2} (ref >=60)

## 2022-12-13 LAB — LIPID PANEL
Cholesterol: 104 mg/dL (ref <200)
HDL: 33 mg/dL — ABNORMAL LOW (ref >=50)
LDL Cholesterol (Calc): 59 mg/dL
Non-HDL Cholesterol (Calc): 71 mg/dL (ref <130)
Total CHOL/HDL Ratio: 3.2 (calc) (ref <5.0)
Triglycerides: 42 mg/dL (ref <150)

## 2022-12-13 LAB — PROTIME-INR
INR: 2.1 — ABNORMAL HIGH
Prothrombin Time: 21.5 s — ABNORMAL HIGH (ref 9.0–11.5)

## 2022-12-13 LAB — CBC WITH DIFFERENTIAL/PLATELET
Absolute Monocytes: 666 {cells}/uL (ref 200–950)
Basophils Absolute: 41 {cells}/uL (ref 0–200)
Basophils Relative: 0.6 %
Eosinophils Absolute: 292 {cells}/uL (ref 15–500)
Eosinophils Relative: 4.3 %
HCT: 42.7 % (ref 35.0–45.0)
Hemoglobin: 13.9 g/dL (ref 11.7–15.5)
Lymphs Abs: 986 {cells}/uL (ref 850–3900)
MCH: 31 pg (ref 27.0–33.0)
MCHC: 32.6 g/dL (ref 32.0–36.0)
MCV: 95.1 fL (ref 80.0–100.0)
MPV: 11 fL (ref 7.5–12.5)
Monocytes Relative: 9.8 %
Neutro Abs: 4814 {cells}/uL (ref 1500–7800)
Neutrophils Relative %: 70.8 %
Platelets: 201 10*3/uL (ref 140–400)
RBC: 4.49 10*6/uL (ref 3.80–5.10)
RDW: 12.9 % (ref 11.0–15.0)
Total Lymphocyte: 14.5 %
WBC: 6.8 10*3/uL (ref 3.8–10.8)

## 2022-12-13 LAB — HEMOGLOBIN A1C
Hgb A1c MFr Bld: 6 %{Hb} — ABNORMAL HIGH (ref <5.7)
Mean Plasma Glucose: 126 mg/dL
eAG (mmol/L): 7 mmol/L

## 2022-12-13 LAB — TSH: TSH: 2.29 m[IU]/L (ref 0.40–4.50)

## 2022-12-14 ENCOUNTER — Ambulatory Visit: Payer: Medicare Other | Admitting: Student

## 2022-12-14 ENCOUNTER — Encounter: Payer: Self-pay | Admitting: Student

## 2022-12-14 VITALS — BP 138/86 | HR 82 | Temp 97.8°F | Ht 63.5 in | Wt 175.0 lb

## 2022-12-14 DIAGNOSIS — Z7901 Long term (current) use of anticoagulants: Secondary | ICD-10-CM | POA: Diagnosis not present

## 2022-12-14 DIAGNOSIS — Z712 Person consulting for explanation of examination or test findings: Secondary | ICD-10-CM

## 2022-12-14 DIAGNOSIS — R532 Functional quadriplegia: Secondary | ICD-10-CM | POA: Diagnosis not present

## 2022-12-14 DIAGNOSIS — I4892 Unspecified atrial flutter: Secondary | ICD-10-CM | POA: Diagnosis not present

## 2022-12-14 DIAGNOSIS — I4891 Unspecified atrial fibrillation: Secondary | ICD-10-CM | POA: Diagnosis not present

## 2022-12-14 NOTE — Patient Instructions (Addendum)
Your INR today was 2.1. Per our discussion, you would prefer a medication that does not require regular lab collection.   Per the pharmacist:  "Our billing specialist ran test claims. Copay for one-month supply of Eliquis 5 mg  is $123.18, and copay for one-month supply Xarelto 20 mg is $118.06 (the other doses should not differ). Both claims have a note from insurance for max of 365 days; we assume that likely means a PA is required after one year."  Therefore, I will contact Khris regarding which medication to proceed with.   I would recommend Eliquis as it has fewer bleeding incidents than Xarelto.  We can follow up in 1 month to see how you are doing on the new medication.

## 2022-12-14 NOTE — Progress Notes (Signed)
Location:  [facility] clinic [facility].   Provider: Dr. Earnestine Mealing  Code Status: DNR Goals of Care:     12/14/2022    1:53 PM  Advanced Directives  Does Patient Have a Medical Advance Directive? Yes  Type of Estate agent of [place];Out of facility DNR (pink MOST or yellow form);Living will  Does patient want to make changes to medical advance directive? No - Patient declined  Copy of Healthcare Power of Attorney in Chart? No - copy requested     Chief Complaint  Patient presents with   Medical Management of Chronic Issues    Medical Management of Chronic Issues. 1 Week Follow up   Quality Metric Gaps    To discuss need for Zoster, Pneumonia, Flu, Covid, Tdap, Dexa    HPI: Patient is a 89 y.o. female seen today for medical management of chronic diseases.    If she needs anything she contacts [place] nurse for assistance.   She does not want to have labs collected every 3 weeks, would prefer to make sure she has the right medication in her pill packs. Delivery is every Wednesday. She keeps a calendar as well.  Daughter helps with everything.  Past Medical History:  Diagnosis Date   Arthritis    CHF (congestive heart failure) (HCC)    Chronic atrial fibrillation (HCC)    Coronary artery disease    Non-ST elevation myocardial infarction in February 2017. Cardiac catheterization showed significant two-vessel coronary artery disease affecting the right coronary artery and left circumflex with no significant disease involving the LAD. Ejection fraction was 30-35% with akinesis of the mid to distal and apical segments consistent with stress-induced cardiomyopathy.   HTN (hypertension)    Hyperlipidemia    MI (myocardial infarction) [facility])    Peripheral neuralgia     Past Surgical History:  Procedure Laterality Date   APPENDECTOMY     CARDIAC CATHETERIZATION N/A 04/23/2015   Procedure: Left Heart Cath and Coronary Angiography;  Surgeon: Iran Ouch, MD;   Location: [facility] INVASIVE CV LAB;  Service: Cardiovascular;  Laterality: N/A;   LUMBAR SPINE SURGERY      No Known Allergies  Outpatient Encounter Medications as of 12/14/2022  Medication Sig   atorvastatin (LIPITOR) 40 MG tablet Take 1 tablet (40 mg total) by mouth daily at 6 PM.   clotrimazole-betamethasone (LOTRISONE) cream Apply 1 Application topically daily.   ferrous gluconate (FERGON) 324 MG tablet Take 324 mg by mouth daily with breakfast.   furosemide (LASIX) 20 MG tablet Take 20 mg by mouth daily.   gemfibrozil (LOPID) 600 MG tablet Take 600 mg by mouth 2 (two) times daily before a meal.   omeprazole (PRILOSEC) 40 MG capsule Take 40 mg by mouth daily.   potassium chloride (K-DUR) 10 MEQ tablet Take 10 mEq by mouth daily.   propranolol (INDERAL) 20 MG tablet Take 20 mg by mouth daily.   sertraline (ZOLOFT) 25 MG tablet Take 25 mg by mouth daily.   spironolactone (ALDACTONE) 25 MG tablet Take 1 tablet (25 mg total) by mouth daily.   warfarin (COUMADIN) 1 MG tablet Take One and a Half (1.5mg ) tablet by mouth once daily.   No facility-administered encounter medications on file as of 12/14/2022.    Review of Systems:  Review of Systems  Health Maintenance  Topic Date Due   DTaP/Tdap/Td (1 - Tdap) Never done   Zoster Vaccines- Shingrix (1 of 2) 05/23/1976   DEXA SCAN  Never done  Pneumonia Vaccine 9+ Years old (2 of 2 - PCV) 04/02/2014   Medicare Annual Wellness (AWV)  03/29/2017   INFLUENZA VACCINE  09/29/2022   COVID-19 Vaccine (4 - 2023-24 season) 10/30/2022   HPV VACCINES  Aged Out    Physical Exam: Vitals:   12/14/22 1351  BP: 138/86  Pulse: 82  Temp: 97.8 F (36.6 C)  SpO2: 95%  Weight: 175 lb (79.4 kg)  Height: 5' 3.5" (1.613 m)   Body mass index is 30.51 kg/m. Physical Exam Cardiovascular:     Rate and Rhythm: Normal rate.     Pulses: Normal pulses.  Pulmonary:     Effort: Pulmonary effort is normal.  Skin:    Comments: Faint erythematous rash  under bilateral breast and bilateral legs, improved from previously.   Neurological:     Mental Status: She is alert.     Labs reviewed: Basic Metabolic Panel: Recent Labs    12/12/22 0830  NA 140  K 4.3  CL 104  CO2 28  GLUCOSE 91  BUN 26*  CREATININE 0.86  CALCIUM 9.3  TSH 2.29   Liver Function Tests: Recent Labs    12/12/22 0830  AST 14  ALT 5*  BILITOT 0.5  PROT 6.5   No results for input(s): "LIPASE", "AMYLASE" in the last 8760 hours. No results for input(s): "AMMONIA" in the last 8760 hours. CBC: Recent Labs    12/12/22 0830  WBC 6.8  NEUTROABS 4,814  HGB 13.9  HCT 42.7  MCV 95.1  PLT 201   Lipid Panel: Recent Labs    12/12/22 0830  CHOL 104  HDL 33*  LDLCALC 59  TRIG 42  CHOLHDL 3.2   Lab Results  Component Value Date   HGBA1C 6.0 (H) 12/12/2022    Procedures since last visit: No results found.  Assessment/Plan Atrial fibrillation and flutter (HCC)  Current use of long term anticoagulation  Quadriplegia, functional (HCC)  Encounter to discuss test results Patient with history of atrial fibrillation most recent INR at goal at 2.1.  Discussed the importance of monitoring this level every 2 to 3 weeks and patient states she at this is not aligned with her goals of care at this stage in life.  She would prefer to have a medication that requires less monitoring.  Provided information from the pharmacist stating the price of Eliquis and Xarelto under her current insurance coverage.  Attempted to call daughter to discuss further however unable to reach at this time.  Will plan to touch base between now and next week so that patient can transition to new medication.  At this time recommend Eliquis given history of less side effects such as bleeding.  Discussed new medication tolerance at 1 month follow-up appointment.  Briefly discussed patient's wheelchair is in route given last week's occupational therapy order.  Will discuss further at 1 month  follow-up.  Discussed patient's CBC, CMP, A1c, lipid panel, INR within therapeutic range at this time.  Labs/tests ordered:  * No order type specified * Next appt:  01/11/2023

## 2022-12-15 ENCOUNTER — Encounter: Payer: Self-pay | Admitting: Student

## 2022-12-15 MED ORDER — APIXABAN 5 MG PO TABS
5.0000 mg | ORAL_TABLET | Freq: Two times a day (BID) | ORAL | 3 refills | Status: AC
Start: 2022-12-15 — End: ?

## 2022-12-15 NOTE — Addendum Note (Signed)
Addended by: Earnestine Mealing on: 12/15/2022 07:36 PM   Modules accepted: Orders

## 2022-12-15 NOTE — Progress Notes (Signed)
Per chart messaging on 10/17, will change anticoagulation to eliquis at this time.

## 2022-12-16 ENCOUNTER — Telehealth: Payer: Self-pay | Admitting: *Deleted

## 2022-12-16 NOTE — Telephone Encounter (Signed)
Rosanne Ashing with [facility] called and spoke with Ma Rings and stated that patient uses Pill Pak and it had already shipped out before he received the change from Warfarin to Eliquis.   Pharmacist wants to know if it will be ok if patient waits till NEXT Friday to make the change from Warfarin to Eliquis.   Please Advise.   Ma Rings will Email Rosanne Ashing, Pharmacist with Dr. Orene Desanctis response.)

## 2022-12-16 NOTE — Telephone Encounter (Signed)
Per Dr. Sydnee Cabal: ok to Discontinue the Warfarin and Start Eliquis NEXT Friday (12/23/22) for Pill Pak.   Printed message and placed on Janci's desk to email the Pharmacist.

## 2023-01-11 ENCOUNTER — Encounter: Payer: Medicare Other | Admitting: Student

## 2023-01-17 ENCOUNTER — Encounter: Payer: Self-pay | Admitting: Student

## 2023-01-18 ENCOUNTER — Encounter: Payer: Self-pay | Admitting: Student

## 2023-01-18 ENCOUNTER — Ambulatory Visit: Payer: Medicare Other | Admitting: Student

## 2023-01-18 VITALS — BP 128/80 | HR 74 | Temp 98.2°F

## 2023-01-18 DIAGNOSIS — I4891 Unspecified atrial fibrillation: Secondary | ICD-10-CM

## 2023-01-18 DIAGNOSIS — L304 Erythema intertrigo: Secondary | ICD-10-CM

## 2023-01-18 DIAGNOSIS — I872 Venous insufficiency (chronic) (peripheral): Secondary | ICD-10-CM

## 2023-01-18 DIAGNOSIS — R532 Functional quadriplegia: Secondary | ICD-10-CM

## 2023-01-18 DIAGNOSIS — Z7901 Long term (current) use of anticoagulants: Secondary | ICD-10-CM

## 2023-01-18 DIAGNOSIS — I509 Heart failure, unspecified: Secondary | ICD-10-CM

## 2023-01-18 DIAGNOSIS — I4892 Unspecified atrial flutter: Secondary | ICD-10-CM

## 2023-01-18 MED ORDER — FLUCONAZOLE 150 MG PO TABS
150.0000 mg | ORAL_TABLET | ORAL | 0 refills | Status: AC
Start: 2023-01-18 — End: 2023-01-25

## 2023-01-18 NOTE — Progress Notes (Signed)
Location:   [facility]   Place of Service:   [facility]  Provider: Sydnee Cabal  Code Status: DNR Goals of Care:     01/17/2023    1:19 PM  Advanced Directives  Does Patient Have a Medical Advance Directive? Yes  Type of Estate agent of [place];Out of facility DNR (pink MOST or yellow form)  Does patient want to make changes to medical advance directive? No - Patient declined  Copy of Healthcare Power of Attorney in Chart? Yes - validated most recent copy scanned in chart (See row information)     Chief Complaint  Patient presents with   Follow-up    1 month follow-up. Discuss need for td/tdap, shingrix, DEXA, pneumonia vaccine, AWV, flu vaccine and covid boosters. Here with [facility] staff member Suan Halter     HPI: Patient is a 89 y.o. female seen today for medical management of chronic diseases.    Patient started Eliquis 1 mo ago. Doing well. No signs of bleeding  No new wheel chair at this time.   Intertrigo- under left breast.   Once a month Gaylyn Rong orders her food.   Past Medical History:  Diagnosis Date   Arthritis    CHF (congestive heart failure) (HCC)    Chronic atrial fibrillation (HCC)    Coronary artery disease    Non-ST elevation myocardial infarction in February 2017. Cardiac catheterization showed significant two-vessel coronary artery disease affecting the right coronary artery and left circumflex with no significant disease involving the LAD. Ejection fraction was 30-35% with akinesis of the mid to distal and apical segments consistent with stress-induced cardiomyopathy.   HTN (hypertension)    Hyperlipidemia    MI (myocardial infarction) [facility])    Peripheral neuralgia     Past Surgical History:  Procedure Laterality Date   APPENDECTOMY     CARDIAC CATHETERIZATION N/A 04/23/2015   Procedure: Left Heart Cath and Coronary Angiography;  Surgeon: Iran Ouch, MD;  Location: [facility] INVASIVE CV LAB;  Service:  Cardiovascular;  Laterality: N/A;   LUMBAR SPINE SURGERY      No Known Allergies  Outpatient Encounter Medications as of 01/18/2023  Medication Sig   apixaban (ELIQUIS) 5 MG TABS tablet Take 1 tablet (5 mg total) by mouth 2 (two) times daily.   atorvastatin (LIPITOR) 40 MG tablet Take 1 tablet (40 mg total) by mouth daily at 6 PM.   clotrimazole-betamethasone (LOTRISONE) cream Apply 1 Application topically daily.   ferrous gluconate (FERGON) 324 MG tablet Take 324 mg by mouth daily with breakfast.   fluconazole (DIFLUCAN) 150 MG tablet Take 1 tablet (150 mg total) by mouth every 3 (three) days for 3 doses.   furosemide (LASIX) 20 MG tablet Take 20 mg by mouth daily.   gemfibrozil (LOPID) 600 MG tablet Take 600 mg by mouth 2 (two) times daily before a meal.   nystatin (MYCOSTATIN) 100000 UNIT/ML suspension Take 5 mLs by mouth 4 (four) times daily.   omeprazole (PRILOSEC) 40 MG capsule Take 40 mg by mouth daily.   potassium chloride (K-DUR) 10 MEQ tablet Take 10 mEq by mouth daily.   propranolol (INDERAL) 20 MG tablet Take 20 mg by mouth daily.   sertraline (ZOLOFT) 25 MG tablet Take 25 mg by mouth daily.   spironolactone (ALDACTONE) 25 MG tablet Take 1 tablet (25 mg total) by mouth daily.   No facility-administered encounter medications on file as of 01/18/2023.    Review of Systems:  Review of  Systems  Health Maintenance  Topic Date Due   DTaP/Tdap/Td (1 - Tdap) Never done   Zoster Vaccines- Shingrix (1 of 2) 05/23/1976   DEXA SCAN  Never done   Pneumonia Vaccine 23+ Years old (2 of 2 - PCV) 04/02/2014   Medicare Annual Wellness (AWV)  03/29/2017   INFLUENZA VACCINE  09/29/2022   COVID-19 Vaccine (4 - 2023-24 season) 10/30/2022   HPV VACCINES  Aged Out    Physical Exam: Vitals:   01/18/23 1324  BP: 128/80  Pulse: 74  Temp: 98.2 F (36.8 C)  TempSrc: Temporal  SpO2: 91%   There is no height or weight on file to calculate BMI. Physical Exam Constitutional:       Appearance: Normal appearance.  Cardiovascular:     Rate and Rhythm: Normal rate.     Pulses: Normal pulses.  Pulmonary:     Effort: Pulmonary effort is normal.     Breath sounds: Normal breath sounds.  Skin:    Comments: Erythematous patch with surrounding stellite erythematous papules.   Neurological:     Mental Status: She is alert.     Labs reviewed: Basic Metabolic Panel: Recent Labs    12/12/22 0830  NA 140  K 4.3  CL 104  CO2 28  GLUCOSE 91  BUN 26*  CREATININE 0.86  CALCIUM 9.3  TSH 2.29   Liver Function Tests: Recent Labs    12/12/22 0830  AST 14  ALT 5*  BILITOT 0.5  PROT 6.5   No results for input(s): "LIPASE", "AMYLASE" in the last 8760 hours. No results for input(s): "AMMONIA" in the last 8760 hours. CBC: Recent Labs    12/12/22 0830  WBC 6.8  NEUTROABS 4,814  HGB 13.9  HCT 42.7  MCV 95.1  PLT 201   Lipid Panel: Recent Labs    12/12/22 0830  CHOL 104  HDL 33*  LDLCALC 59  TRIG 42  CHOLHDL 3.2   Lab Results  Component Value Date   HGBA1C 6.0 (H) 12/12/2022    Procedures since last visit: No results found.  Assessment/Plan Intertrigo - Plan: fluconazole (DIFLUCAN) 150 MG tablet  Atrial fibrillation and flutter (HCC)  Current use of long term anticoagulation  Quadriplegia, functional (HCC)  Congestive heart failure, unspecified HF chronicity, unspecified heart failure type (HCC)  Venous stasis dermatitis Patient presents today for follow up of atrial fibrillation. Rate controlled. No signs of bleeding on Eliquis. Significant rash under her breast despite daily use of clotrimazole for 2 weeks. Plan to start diflucan. Functional quadriplegia, new order for wheel chair written due to closure of DME company. Appear euvolemic on exam. Bilateral LE skin clear from rash at this time. Encourage compression and elevation.   Labs/tests ordered:  * No order type specified * Next appt:  Visit date not found

## 2023-01-18 NOTE — Patient Instructions (Signed)
I am adding three pills to your medications to treat the rash under your breast. It should be in your next pill pack: Fluconazole 150 mg every 3 days for 3 doses.   Please go to the pharmacy to get your vaccinations: Tetanus (tdap), Prevnar 20, Flu vaccine as well -- go to [facility] or [facility].

## 2023-01-22 ENCOUNTER — Encounter: Payer: Self-pay | Admitting: Student

## 2023-02-07 ENCOUNTER — Inpatient Hospital Stay: Payer: Medicare Other

## 2023-02-07 ENCOUNTER — Emergency Department: Payer: Medicare Other

## 2023-02-07 ENCOUNTER — Encounter: Payer: Self-pay | Admitting: Student

## 2023-02-07 ENCOUNTER — Other Ambulatory Visit: Payer: Self-pay

## 2023-02-07 ENCOUNTER — Inpatient Hospital Stay
Admission: EM | Admit: 2023-02-07 | Discharge: 2023-02-15 | DRG: 871 | Disposition: A | Discharge status: Skilled Nursing Facility | Payer: Medicare Other | Attending: Internal Medicine | Admitting: Internal Medicine

## 2023-02-07 DIAGNOSIS — N179 Acute kidney failure, unspecified: Secondary | ICD-10-CM | POA: Diagnosis present

## 2023-02-07 DIAGNOSIS — I4891 Unspecified atrial fibrillation: Secondary | ICD-10-CM | POA: Diagnosis present

## 2023-02-07 DIAGNOSIS — Z6830 Body mass index (BMI) 30.0-30.9, adult: Secondary | ICD-10-CM

## 2023-02-07 DIAGNOSIS — I480 Paroxysmal atrial fibrillation: Secondary | ICD-10-CM | POA: Diagnosis present

## 2023-02-07 DIAGNOSIS — G9341 Metabolic encephalopathy: Secondary | ICD-10-CM

## 2023-02-07 DIAGNOSIS — R532 Functional quadriplegia: Secondary | ICD-10-CM | POA: Diagnosis present

## 2023-02-07 DIAGNOSIS — J690 Pneumonitis due to inhalation of food and vomit: Secondary | ICD-10-CM | POA: Diagnosis present

## 2023-02-07 DIAGNOSIS — Z831 Family history of other infectious and parasitic diseases: Secondary | ICD-10-CM

## 2023-02-07 DIAGNOSIS — J9601 Acute respiratory failure with hypoxia: Secondary | ICD-10-CM

## 2023-02-07 DIAGNOSIS — F32A Depression, unspecified: Secondary | ICD-10-CM | POA: Diagnosis present

## 2023-02-07 DIAGNOSIS — R6520 Severe sepsis without septic shock: Secondary | ICD-10-CM | POA: Diagnosis present

## 2023-02-07 DIAGNOSIS — I482 Chronic atrial fibrillation, unspecified: Secondary | ICD-10-CM | POA: Diagnosis present

## 2023-02-07 DIAGNOSIS — R4182 Altered mental status, unspecified: Secondary | ICD-10-CM | POA: Diagnosis present

## 2023-02-07 DIAGNOSIS — L8990 Pressure ulcer of unspecified site, unspecified stage: Secondary | ICD-10-CM | POA: Insufficient documentation

## 2023-02-07 DIAGNOSIS — R011 Cardiac murmur, unspecified: Secondary | ICD-10-CM | POA: Diagnosis present

## 2023-02-07 DIAGNOSIS — Z993 Dependence on wheelchair: Secondary | ICD-10-CM

## 2023-02-07 DIAGNOSIS — F03B0 Unspecified dementia, moderate, without behavioral disturbance, psychotic disturbance, mood disturbance, and anxiety: Secondary | ICD-10-CM | POA: Diagnosis present

## 2023-02-07 DIAGNOSIS — F419 Anxiety disorder, unspecified: Secondary | ICD-10-CM | POA: Diagnosis present

## 2023-02-07 DIAGNOSIS — I4892 Unspecified atrial flutter: Secondary | ICD-10-CM | POA: Diagnosis present

## 2023-02-07 DIAGNOSIS — L89151 Pressure ulcer of sacral region, stage 1: Secondary | ICD-10-CM | POA: Diagnosis present

## 2023-02-07 DIAGNOSIS — A4151 Sepsis due to Escherichia coli [E. coli]: Principal | ICD-10-CM | POA: Diagnosis present

## 2023-02-07 DIAGNOSIS — I2510 Atherosclerotic heart disease of native coronary artery without angina pectoris: Secondary | ICD-10-CM | POA: Diagnosis present

## 2023-02-07 DIAGNOSIS — I110 Hypertensive heart disease with heart failure: Secondary | ICD-10-CM | POA: Diagnosis present

## 2023-02-07 DIAGNOSIS — E782 Mixed hyperlipidemia: Secondary | ICD-10-CM | POA: Diagnosis present

## 2023-02-07 DIAGNOSIS — D696 Thrombocytopenia, unspecified: Secondary | ICD-10-CM

## 2023-02-07 DIAGNOSIS — Z87891 Personal history of nicotine dependence: Secondary | ICD-10-CM

## 2023-02-07 DIAGNOSIS — E872 Acidosis, unspecified: Secondary | ICD-10-CM | POA: Diagnosis present

## 2023-02-07 DIAGNOSIS — E222 Syndrome of inappropriate secretion of antidiuretic hormone: Secondary | ICD-10-CM | POA: Diagnosis present

## 2023-02-07 DIAGNOSIS — K219 Gastro-esophageal reflux disease without esophagitis: Secondary | ICD-10-CM | POA: Diagnosis present

## 2023-02-07 DIAGNOSIS — I2489 Other forms of acute ischemic heart disease: Secondary | ICD-10-CM | POA: Diagnosis present

## 2023-02-07 DIAGNOSIS — I5023 Acute on chronic systolic (congestive) heart failure: Secondary | ICD-10-CM | POA: Diagnosis not present

## 2023-02-07 DIAGNOSIS — Z7901 Long term (current) use of anticoagulants: Secondary | ICD-10-CM

## 2023-02-07 DIAGNOSIS — Z79899 Other long term (current) drug therapy: Secondary | ICD-10-CM

## 2023-02-07 DIAGNOSIS — G3184 Mild cognitive impairment, so stated: Secondary | ICD-10-CM | POA: Diagnosis present

## 2023-02-07 DIAGNOSIS — I739 Peripheral vascular disease, unspecified: Secondary | ICD-10-CM | POA: Diagnosis present

## 2023-02-07 DIAGNOSIS — G9340 Encephalopathy, unspecified: Principal | ICD-10-CM

## 2023-02-07 DIAGNOSIS — H9190 Unspecified hearing loss, unspecified ear: Secondary | ICD-10-CM | POA: Diagnosis not present

## 2023-02-07 DIAGNOSIS — I10 Essential (primary) hypertension: Secondary | ICD-10-CM | POA: Diagnosis present

## 2023-02-07 DIAGNOSIS — Z66 Do not resuscitate: Secondary | ICD-10-CM | POA: Diagnosis present

## 2023-02-07 DIAGNOSIS — A419 Sepsis, unspecified organism: Secondary | ICD-10-CM | POA: Diagnosis present

## 2023-02-07 DIAGNOSIS — Z8249 Family history of ischemic heart disease and other diseases of the circulatory system: Secondary | ICD-10-CM

## 2023-02-07 DIAGNOSIS — I252 Old myocardial infarction: Secondary | ICD-10-CM

## 2023-02-07 DIAGNOSIS — N3000 Acute cystitis without hematuria: Secondary | ICD-10-CM

## 2023-02-07 DIAGNOSIS — I5A Non-ischemic myocardial injury (non-traumatic): Secondary | ICD-10-CM | POA: Insufficient documentation

## 2023-02-07 DIAGNOSIS — E669 Obesity, unspecified: Secondary | ICD-10-CM | POA: Diagnosis present

## 2023-02-07 DIAGNOSIS — I5022 Chronic systolic (congestive) heart failure: Secondary | ICD-10-CM

## 2023-02-07 DIAGNOSIS — R7989 Other specified abnormal findings of blood chemistry: Secondary | ICD-10-CM | POA: Insufficient documentation

## 2023-02-07 LAB — CBC WITH DIFFERENTIAL/PLATELET
Abs Immature Granulocytes: 0.27 10*3/uL — ABNORMAL HIGH (ref 0.00–0.07)
Basophils Absolute: 0.1 10*3/uL (ref 0.0–0.1)
Basophils Relative: 0 %
Eosinophils Absolute: 0 10*3/uL (ref 0.0–0.5)
Eosinophils Relative: 0 %
HCT: 41 % (ref 36.0–46.0)
Hemoglobin: 14.2 g/dL (ref 12.0–15.0)
Immature Granulocytes: 1 %
Lymphocytes Relative: 3 %
Lymphs Abs: 0.5 10*3/uL — ABNORMAL LOW (ref 0.7–4.0)
MCH: 30.7 pg (ref 26.0–34.0)
MCHC: 34.6 g/dL (ref 30.0–36.0)
MCV: 88.7 fL (ref 80.0–100.0)
Monocytes Absolute: 2.2 10*3/uL — ABNORMAL HIGH (ref 0.1–1.0)
Monocytes Relative: 12 %
Neutro Abs: 16 10*3/uL — ABNORMAL HIGH (ref 1.7–7.7)
Neutrophils Relative %: 84 %
Platelets: 145 10*3/uL — ABNORMAL LOW (ref 150–400)
RBC: 4.62 MIL/uL (ref 3.87–5.11)
RDW: 15.6 % — ABNORMAL HIGH (ref 11.5–15.5)
WBC: 19 10*3/uL — ABNORMAL HIGH (ref 4.0–10.5)
nRBC: 0 % (ref 0.0–0.2)

## 2023-02-07 LAB — BLOOD GAS, VENOUS
Acid-base deficit: 4.4 mmol/L — ABNORMAL HIGH (ref 0.0–2.0)
Bicarbonate: 21.6 mmol/L (ref 20.0–28.0)
O2 Saturation: 54.3 %
Patient temperature: 37
pCO2, Ven: 42 mm[Hg] — ABNORMAL LOW (ref 44–60)
pH, Ven: 7.32 (ref 7.25–7.43)
pO2, Ven: 34 mm[Hg] (ref 32–45)

## 2023-02-07 LAB — URINALYSIS, W/ REFLEX TO CULTURE (INFECTION SUSPECTED)
Bilirubin Urine: NEGATIVE
Glucose, UA: NEGATIVE mg/dL
Ketones, ur: NEGATIVE mg/dL
Nitrite: NEGATIVE
Protein, ur: 100 mg/dL — AB
RBC / HPF: >50 RBC/hpf (ref 0–5)
Specific Gravity, Urine: 1.012 (ref 1.005–1.030)
WBC, UA: >50 WBC/hpf (ref 0–5)
pH: 5 (ref 5.0–8.0)

## 2023-02-07 LAB — HEPATIC FUNCTION PANEL
ALT: 10 U/L (ref 0–44)
AST: 19 U/L (ref 15–41)
Albumin: 2.4 g/dL — ABNORMAL LOW (ref 3.5–5.0)
Alkaline Phosphatase: 97 U/L (ref 38–126)
Bilirubin, Direct: 0.5 mg/dL — ABNORMAL HIGH (ref 0.0–0.2)
Indirect Bilirubin: 0.5 mg/dL (ref 0.3–0.9)
Total Bilirubin: 1 mg/dL (ref <1.2)
Total Protein: 5.9 g/dL — ABNORMAL LOW (ref 6.5–8.1)

## 2023-02-07 LAB — BASIC METABOLIC PANEL
Anion gap: 16 — ABNORMAL HIGH (ref 5–15)
BUN: 95 mg/dL — ABNORMAL HIGH (ref 8–23)
CO2: 20 mmol/L — ABNORMAL LOW (ref 22–32)
Calcium: 9.1 mg/dL (ref 8.9–10.3)
Chloride: 93 mmol/L — ABNORMAL LOW (ref 98–111)
Creatinine, Ser: 1.9 mg/dL — ABNORMAL HIGH (ref 0.44–1.00)
GFR, Estimated: 24 mL/min — ABNORMAL LOW (ref >60)
Glucose, Bld: 103 mg/dL — ABNORMAL HIGH (ref 70–99)
Potassium: 5 mmol/L (ref 3.5–5.1)
Sodium: 129 mmol/L — ABNORMAL LOW (ref 135–145)

## 2023-02-07 LAB — TROPONIN I (HIGH SENSITIVITY): Troponin I (High Sensitivity): 35 ng/L — ABNORMAL HIGH (ref <18)

## 2023-02-07 LAB — LACTIC ACID, PLASMA
Lactic Acid, Venous: 1.6 mmol/L (ref 0.5–1.9)
Lactic Acid, Venous: 0.9 mmol/L (ref 0.5–1.9)

## 2023-02-07 LAB — PROCALCITONIN: Procalcitonin: 27.93 ng/mL

## 2023-02-07 MED ORDER — ACETAMINOPHEN 325 MG PO TABS
650.0000 mg | ORAL_TABLET | Freq: Once | ORAL | Status: AC
  Administered 2023-02-07: 650 mg via ORAL
  Filled 2023-02-07: qty 2

## 2023-02-07 MED ORDER — ACETAMINOPHEN 325 MG PO TABS
650.0000 mg | ORAL_TABLET | Freq: Four times a day (QID) | ORAL | Status: AC | PRN
  Administered 2023-02-08: 650 mg via ORAL
  Filled 2023-02-07 (×2): qty 2

## 2023-02-07 MED ORDER — HEPARIN SODIUM (PORCINE) 5000 UNIT/ML IJ SOLN: 5000.0000 [IU] | Freq: Three times a day (TID) | INTRAMUSCULAR | Status: DC

## 2023-02-07 MED ORDER — ACETAMINOPHEN 650 MG RE SUPP: 650.0000 mg | Freq: Four times a day (QID) | RECTAL | Status: AC | PRN

## 2023-02-07 MED ORDER — VANCOMYCIN HCL IN DEXTROSE 1-5 GM/200ML-% IV SOLN
1000.0000 mg | Freq: Once | INTRAVENOUS | Status: AC
  Administered 2023-02-07: 1000 mg via INTRAVENOUS
  Filled 2023-02-07: qty 200

## 2023-02-07 MED ORDER — SODIUM CHLORIDE 0.9 % IV SOLN
2.0000 g | Freq: Once | INTRAVENOUS | Status: AC
  Administered 2023-02-07: 2 g via INTRAVENOUS
  Filled 2023-02-07: qty 12.5

## 2023-02-07 MED ORDER — SODIUM CHLORIDE 0.9 % IV SOLN
INTRAVENOUS | Status: AC
Start: 2023-02-07 — End: 2023-02-08

## 2023-02-07 MED ORDER — HYDRALAZINE HCL 20 MG/ML IJ SOLN: 5.0000 mg | Freq: Four times a day (QID) | INTRAMUSCULAR | Status: AC | PRN

## 2023-02-07 MED ORDER — APIXABAN 2.5 MG PO TABS
2.5000 mg | ORAL_TABLET | Freq: Two times a day (BID) | ORAL | Status: DC
  Administered 2023-02-07 – 2023-02-10 (×6): 2.5 mg via ORAL
  Filled 2023-02-07 (×7): qty 1

## 2023-02-07 MED ORDER — SODIUM CHLORIDE 0.9 % IV BOLUS
1000.0000 mL | Freq: Once | INTRAVENOUS | Status: AC
  Administered 2023-02-07: 1000 mL via INTRAVENOUS

## 2023-02-07 MED ORDER — LACTATED RINGERS IV SOLN: 150.0000 mL/h | INTRAVENOUS | Status: DC

## 2023-02-07 MED ORDER — SODIUM CHLORIDE 0.9 % IV SOLN
500.0000 mg | INTRAVENOUS | Status: DC
  Administered 2023-02-07 – 2023-02-08 (×2): 500 mg via INTRAVENOUS
  Filled 2023-02-07 (×3): qty 5

## 2023-02-07 MED ORDER — ONDANSETRON HCL 4 MG PO TABS: 4.0000 mg | ORAL_TABLET | Freq: Four times a day (QID) | ORAL | Status: AC | PRN

## 2023-02-07 MED ORDER — SODIUM CHLORIDE 0.9 % IV SOLN
2.0000 g | INTRAVENOUS | Status: DC
  Administered 2023-02-07: 2 g via INTRAVENOUS
  Filled 2023-02-07: qty 20

## 2023-02-07 MED ORDER — ONDANSETRON HCL 4 MG/2ML IJ SOLN: 4.0000 mg | Freq: Four times a day (QID) | INTRAMUSCULAR | Status: AC | PRN

## 2023-02-07 NOTE — Assessment & Plan Note (Deleted)
Suspect encephalopathy secondary to severe sepsis in setting of UTI Fall precaution, aspiration precaution

## 2023-02-07 NOTE — ED Provider Notes (Addendum)
[facility] Provider Note    Event Date/Time   First MD Initiated Contact with Patient 02/07/23 1604     (approximate)   History   Altered Mental Status   HPI  Kathy Lowery is a 89 year old female presenting to the emergency department for evaluation of altered mental status.  Patient lives in independent living where she was noted to be altered from her baseline.  Patient reports that she is having some back pain, denies other complaints.  Additional history obtained from patient's daughter via phone who lives in [place] and is patient's healthcare power of attorney.  She reports that the past several days, patient has had increased confusion and confrontational behavior.  She has not been eating regularly and is she is concerned that the patient is not appropriately taking her medications.  She does report that the patient has historically not wanted to be in assisted living, but she does not feel that patient can be safely discharged back to independent living in her current state.    Physical Exam   Triage Vital Signs: ED Triage Vitals  Encounter Vitals Group     BP 02/07/23 1317 (!) 104/59     Systolic BP Percentile --      Diastolic BP Percentile --      Pulse Rate 02/07/23 1317 73     Resp 02/07/23 1317 18     Temp 02/07/23 1317 99.4 F (37.4 C)     Temp src --      SpO2 02/07/23 1317 94 %     Weight --      Height --      Head Circumference --      Peak Flow --      Pain Score 02/07/23 1348 0     Pain Loc --      Pain Education --      Exclude from Growth Chart --     Most recent vital signs: Vitals:   02/07/23 1600 02/07/23 1630  BP: 115/67 104/63  Pulse: 67 66  Resp: 17 (!) 22  Temp:    SpO2: 91% 90%     General: Awake, interactive  CV:  Regular rate, good peripheral perfusion. Resp:  Unlabored respirations, lungs clear to auscultation Abd:  Nondistended, soft, not obviously tender to palpation, but is reporting pain in  her back Neuro:  No gross facial asymmetry, moving extremity spontaneously, oriented to self, intermittently oriented to place, not oriented to situation   ED Results / Procedures / Treatments   Labs (all labs ordered are listed, but only abnormal results are displayed) Labs Reviewed  CBC WITH DIFFERENTIAL/PLATELET - Abnormal; Notable for the following components:      Result Value   WBC 19.0 (*)    RDW 15.6 (*)    Platelets 145 (*)    Neutro Abs 16.0 (*)    Lymphs Abs 0.5 (*)    Monocytes Absolute 2.2 (*)    Abs Immature Granulocytes 0.27 (*)    All other components within normal limits  BASIC METABOLIC PANEL - Abnormal; Notable for the following components:   Sodium 129 (*)    Chloride 93 (*)    CO2 20 (*)    Glucose, Bld 103 (*)    BUN 95 (*)    Creatinine, Ser 1.90 (*)    GFR, Estimated 24 (*)    Anion gap 16 (*)    All other components within normal limits  URINALYSIS, W/ REFLEX TO  CULTURE (INFECTION SUSPECTED) - Abnormal; Notable for the following components:   Color, Urine YELLOW (*)    APPearance TURBID (*)    Hgb urine dipstick LARGE (*)    Protein, ur 100 (*)    Leukocytes,Ua MODERATE (*)    Bacteria, UA MANY (*)    All other components within normal limits  CULTURE, BLOOD (ROUTINE X 2)  CULTURE, BLOOD (ROUTINE X 2)  URINE CULTURE  LACTIC ACID, PLASMA     EKG EKG independently reviewed interpreted by myself (ER attending) demonstrates:  EKG demonstrates A-fib at a rate of 68, QRS 143, QTc 472, left bundle branch block morphology noted without appreciable superimposed ischemia, not new  RADIOLOGY Imaging independently reviewed and interpreted by myself demonstrates:  CT head without acute bleed CT abdomen pelvis without obstructing renal stone or other acute findings, radiology does note possible breast nodule  PROCEDURES:  Critical Care performed: Yes, see critical care procedure note(s)  Procedures   MEDICATIONS ORDERED IN ED: Medications   cefTRIAXone (ROCEPHIN) 2 g in sodium chloride 0.9 % 100 mL IVPB (0 g Intravenous Stopped 02/07/23 1456)  sodium chloride 0.9 % bolus 1,000 mL (has no administration in time range)  acetaminophen (TYLENOL) tablet 650 mg (650 mg Oral Given 02/07/23 1516)     IMPRESSION / MDM / ASSESSMENT AND PLAN / ED COURSE  I reviewed the triage vital signs and the nursing notes.  Differential diagnosis includes, but is not limited to, UTI, intracranial bleed, anemia, electrolyte abnormality, other infection  Patient's presentation is most consistent with acute presentation with potential threat to life or bodily function.  89 year old female presenting to the emergency department for evaluation of encephalopathy.  Vitals stable on presentation.  Labs notable for leukocytosis with WBC of 19.  Creatinine is elevated at 1.9, baseline appears around 0.8.  Ordered for 1 L of IV fluids.  Urinalysis here is concerning for infection, blood noted and with patient's back pain, CT was obtained which fortunately was without evidence of stone.  She was ordered for Rocephin as well as Tylenol for pain control.  Given her encephalopathy in the setting of UTI and meeting sepsis criteria as below, will reach out to hospitalist team for admission.  Clinical Course as of 02/07/23 1705  Tue Feb 07, 2023  1640 Resp(!): 22 With tachypnea and elevated white blood cell, patient does now meet sepsis criteria.  Blood cultures and lactate already drawn, has already received antibiotics.   [NR]  1705 Case discussed with Dr. Sedalia Muta.  She will evaluate the patient for anticipated admission. [NR]    Clinical Course User Index [NR] Trinna Post, MD     FINAL CLINICAL IMPRESSION(S) / ED DIAGNOSES   Final diagnoses:  Acute encephalopathy  Acute cystitis without hematuria     Rx / DC Orders   ED Discharge Orders     None        Note:  This document was prepared using Dragon voice recognition software and may include  unintentional dictation errors.   Trinna Post, MD 02/07/23 1643    Trinna Post, MD 02/07/23 [phone]

## 2023-02-07 NOTE — Assessment & Plan Note (Addendum)
Patient mostly wheelchair-bound.

## 2023-02-07 NOTE — H&P (Addendum)
History and Physical   Nadeige Goosby ACZ:[id number] DOB: 1924-05-11 DOA: 02/07/2023  PCP: Earnestine Mealing, MD Outpatient Specialists: Dr. Carollee Leitz clinic podiatry Patient coming from: [facility], via EMS  I have personally briefly reviewed patient's old medical records in [facility] EMR.  Chief Concern: Altered mental status  HPI: Ms. Chandelle Dinga is a 89 year old female with atrial fibrillation and flutter, quadriplegia functional, on chronic anticoagulation with Eliquis, history of intertrigo, hyperlipidemia, hypertension, depression, anxiety, GERD, who presents to the emergency department from [facility] via EMS for chief concerns of altered mental status.  Vitals in the ED showed temperature of 99.4, respiration rate 19, heart rate of 73, blood pressure 112/71, SpO2 of 93% on room air.  Serum sodium is 129, potassium 5.0, chloride 93, serum creatinine 1.90, eGFR 24, nonfasting blood glucose 103, WBC 19, hemoglobin 14.2, platelets of 145.  UA was positive for moderate leukocytes.  Blood cultures x 2 are in process.  Urine culture is in process.  ED treatment: Ceftriaxone 2 g IV 7-day course were ordered, sodium chloride 1 L bolus, acetaminophen 650 mg p.o. one-time dose ------------------------------ At bedside, patient able to tell me her first and last name, her age correctly, her location of hospital bed, and the current calendar year.  She was not able to tell me why she is in the hospital.  She shakes her head denying being in pain when asked.  I attempted to feed her at bedside, patient chews her food pretty well and then ultimately spits it out shaking her head towards the food.  Per daughter over the phone, facility did not report fever, vomiting, diarrhea.  Daughter reports that patient has had change in mentation since Friday because she calls and talks to her mother every day.  Daughter further reports that he was noted she has poor p.o. intake for the past several  days.  Social history: Patient is from [facility] nursing facility.  She denies tobacco, EtOH, recreational drug use.  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  ROS: Unable to complete as patient has severe hearing difficulties  ED Course: Discussed with EDP, patient requiring hospitalization for chief concerns of severe sepsis.  Assessment/Plan  Principal Problem:   Severe sepsis with acute organ dysfunction (HCC) Active Problems:   AKI (acute kidney injury) (HCC)   Acute hypoxic respiratory failure (HCC)   Essential hypertension   Coronary artery disease   Atrial fibrillation and flutter (HCC)   Hyperlipidemia, mixed   Quadriplegia, functional (HCC)   PAD (peripheral artery disease) (HCC)   Mild cognitive impairment   Altered mental state   Hard of hearing   Metabolic acidosis   Elevated troponin   Assessment and Plan:  * Severe sepsis with acute organ dysfunction (HCC) Blood cultures x 2 are in process, urine culture in process  Check procalcitonin on admission Ddx includes aspiration pna Patient endorses compliance with medications, low suspicion for PE Addendum: Status post ceftriaxone 2 g IV daily per EDP.  I have broaden antibiotics to cefepime, vancomycin per pharmacy and added azithromycin 500 mg IV daily Continue sodium chloride infusion at 125 mL/h on admission Maintain MAP greater than 65 Patient is maintaining appropriate MAP, and status post sodium chloride 1 L bolus Continue with sodium chloride infusion at 125 mL/h, 1 day ordered Admit to PCU, inpatient  Acute hypoxic respiratory failure (HCC) Suspect secondary to pneumonia in setting of severe sepsis Check VBG Patient supplementation ordered place and discussed with  nursing via messaging Continuous pulse oximetry ordered Check a second lactic acid, MRSA PCR Added azithromycin for atypical pneumonia coverage Recheck procalcitonin in the a.m. Maintain SpO2 greater  than 92% Incentive spirometry, flutter valve, respiratory therapy has been consulted for teaching patient technique for I-S and flutter valve use Aspiration precautions  AKI (acute kidney injury) (HCC) Presumed multifactorial secondary to urinary tract infection and patient needing sepsis criteria Status post sodium chloride 1 L bolus per EDP Patient is maintaining appropriate MAP, therefore on admission we have ordered sodium chloride infusion at 125 mL/h Strict I's and O's Recheck BMP in the a.m.  Essential hypertension Hydralazine 5 mg IV every 6 hours as needed for SBP > 175, 5 days ordered  Elevated troponin Will follow second HS Troponin Currently suspect secondary to demand ischemia Continue telemetry  Metabolic acidosis Check VBG  Hard of hearing Severe, chronic  Altered mental state Suspect encephalopathy secondary to severe sepsis in setting of UTI Fall precaution, aspiration precaution  Quadriplegia, functional (HCC) Patient is able to wiggle her toes at bedside  Atrial fibrillation and flutter (HCC) Apixaban 2.5 mg ordered on admission AM team to resume regular dosing pending clinical course, renal improvement as appropriate  Chart reviewed.   DVT prophylaxis: Heparin 5000 units subcutaneous every 8 hours Code Status: DNR/DNI, MOST form at bedside.  Confirmed with daughter over the phone.  Per daughter, patient is adamant about DNR/DNI.  Daughter reports that patient has been without her husband for 25 years, and patient has endorsed being tired. Diet: Heart healthy Family Communication: called daughter and had extensive discussion regarding patient poor prognosis Disposition Plan: Pending clinical course; poor prognosis Consults called: Pharmacy Admission status: PCU, inpatient  Past Medical History:  Diagnosis Date   Arthritis    CHF (congestive heart failure) (HCC)    Chronic atrial fibrillation (HCC)    Coronary artery disease    Non-ST elevation  myocardial infarction in February 2017. Cardiac catheterization showed significant two-vessel coronary artery disease affecting the right coronary artery and left circumflex with no significant disease involving the LAD. Ejection fraction was 30-35% with akinesis of the mid to distal and apical segments consistent with stress-induced cardiomyopathy.   HTN (hypertension)    Hyperlipidemia    MI (myocardial infarction) [facility])    Peripheral neuralgia    Past Surgical History:  Procedure Laterality Date   APPENDECTOMY     CARDIAC CATHETERIZATION N/A 04/23/2015   Procedure: Left Heart Cath and Coronary Angiography;  Surgeon: Iran Ouch, MD;  Location: [facility] INVASIVE CV LAB;  Service: Cardiovascular;  Laterality: N/A;   LUMBAR SPINE SURGERY     Social History:  reports that she has quit smoking. Her smoking use included cigarettes. She has never used smokeless tobacco. She reports that she does not drink alcohol and does not use drugs.  No Known Allergies Family History  Problem Relation Age of Onset   CAD Father    Hypertension Father    Tuberculosis Mother    Family history: Family history reviewed and not pertinent.  Prior to Admission medications   Medication Sig Start Date End Date Taking? Authorizing Provider  apixaban (ELIQUIS) 5 MG TABS tablet Take 1 tablet (5 mg total) by mouth 2 (two) times daily. 12/15/22   Earnestine Mealing, MD  atorvastatin (LIPITOR) 40 MG tablet Take 1 tablet (40 mg total) by mouth daily at 6 PM. 04/25/15   Auburn Bilberry, MD  clotrimazole-betamethasone (LOTRISONE) cream Apply 1 Application topically daily. 12/07/22   Earnestine Mealing,  MD  ferrous gluconate (FERGON) 324 MG tablet Take 324 mg by mouth daily with breakfast.    [provider]  furosemide (LASIX) 20 MG tablet Take 20 mg by mouth daily.    [provider]  gemfibrozil (LOPID) 600 MG tablet Take 600 mg by mouth 2 (two) times daily before a meal.    [provider]   nystatin (MYCOSTATIN) 100000 UNIT/ML suspension Take 5 mLs by mouth 4 (four) times daily.    [provider]  omeprazole (PRILOSEC) 40 MG capsule Take 40 mg by mouth daily.    [provider]  potassium chloride (K-DUR) 10 MEQ tablet Take 10 mEq by mouth daily.    [provider]  propranolol (INDERAL) 20 MG tablet Take 20 mg by mouth daily.    [provider]  sertraline (ZOLOFT) 25 MG tablet Take 25 mg by mouth daily.    [provider]  spironolactone (ALDACTONE) 25 MG tablet Take 1 tablet (25 mg total) by mouth daily. 08/03/15   Iran Ouch, MD   Physical Exam: Vitals:   02/07/23 2100 02/07/23 2101 02/07/23 2200 02/07/23 2220  BP: 116/87 116/87 (!) 107/53   Pulse: 76  81   Resp: (!) 23 18 (!) 26   Temp:    99 F (37.2 C)  TempSrc:    Oral  SpO2: 96%  97%    Constitutional: appears age-appropriate, frail, weak, calm Eyes: PERRL, lids and conjunctivae normal ENMT: Mucous membranes are dry. Posterior pharynx clear of any exudate or lesions. Age-appropriate dentition. Hearing appropriate Neck: normal, supple, no masses, no thyromegaly Respiratory: Generalized decreased lung sounds bilaterally, no wheezing, no crackles.  Decreased respiratory effort. No accessory muscle use.  Insufficient strength with coughing Cardiovascular: Regular rate and rhythm, no murmurs / rubs / gallops. No extremity edema. 2+ pedal pulses. No carotid bruits.  Abdomen: no tenderness, no masses palpated, no hepatosplenomegaly. Bowel sounds positive.  Musculoskeletal: no clubbing / cyanosis. No joint deformity upper and lower extremities.  Weak ROM bilateral upper extremity, no contractures, no atrophy. Normal muscle tone.  Skin: no rashes, lesions, ulcers. No induration Neurologic: Sensation intact.  Generalized decreased strength.  No focal deficits on bedside evaluation Psychiatric: Unable to assess judgment and insight. Alert and oriented x 3.  Depressed mood.    EKG: independently reviewed, showing atrial flutter, irregular rate, with rate of 68, left bundle branch block, QTc 472  Chest x-ray on Admission: I personally reviewed and I agree with radiologist reading as below.  DG Chest Port 1 View  Result Date: 02/07/2023 CLINICAL DATA:  Cough, short of breath EXAM: PORTABLE CHEST 1 VIEW COMPARISON:  08/21/2016 FINDINGS: Single frontal view of the chest demonstrates stable enlargement of the cardiac silhouette. Moderate hiatal hernia. There is patchy bilateral airspace disease, most pronounced in the right upper lobe abutting the minor fissure. Atelectatic changes are again seen at the lung bases. No effusion or pneumothorax. Chronic elevation of the right hemidiaphragm. No acute bony abnormalities. IMPRESSION: 1. Patchy bilateral perihilar airspace disease most pronounced in the right upper lobe, compatible with bronchopneumonia. 2. Hiatal hernia. Electronically Signed   By: Sharlet Salina M.D.   On: 02/07/2023 20:45   CT ABDOMEN PELVIS WO CONTRAST  Result Date: 02/07/2023 CLINICAL DATA:  UTI, recurrent/complicated (Female). Altered mental status. EXAM: CT ABDOMEN AND PELVIS WITHOUT CONTRAST TECHNIQUE: Multidetector CT imaging of the abdomen and pelvis was performed following the standard protocol without IV contrast. RADIATION DOSE REDUCTION: This exam was performed according  to the departmental dose-optimization program which includes automated exposure control, adjustment of the mA and/or kV according to patient size and/or use of iterative reconstruction technique. COMPARISON:  None Available. FINDINGS: Lower chest: There are patchy atelectatic changes in the visualized lung bases. No overt consolidation. No pleural effusion. The heart is normal in size. No pericardial effusion. There is a slightly lobulated 1.4 x 2.1 cm soft tissue attenuation nodule in the left breast, which is incompletely characterized on the current exam. Correlate clinically to  determine the need for additional imaging. Hepatobiliary: The liver is normal in size. Non-cirrhotic configuration. No suspicious mass. No intrahepatic or extrahepatic bile duct dilation. Small volume dependent peripherally rim calcified gallstones noted without imaging signs of acute cholecystitis. Normal gallbladder wall thickness. No pericholecystic inflammatory changes. Pancreas: Unremarkable. No pancreatic ductal dilatation or surrounding inflammatory changes. Spleen: Within normal limits. No focal lesion. Adrenals/Urinary Tract: Adrenal glands are unremarkable. Evaluation of bilateral kidneys is limited due to lack of intravenous contrast. There is a partially exophytic cyst arising from the right kidney interpolar region, anteromedially measuring 2.5 x 3.1 cm. There are additional smaller cysts in bilateral kidneys. There are at least 2, sub 4 mm nonobstructing calculi in the right kidney and a single punctate nonobstructing calculus in the left kidney lower pole. No ureterolithiasis on either side. No hydroureteronephrosis. Urinary bladder is under distended, precluding optimal assessment. However, no large mass or stones identified. No perivesical fat stranding. Stomach/Bowel: Small-to-moderate hiatal hernia noted. No disproportionate dilation of the small or large bowel loops. No evidence of abnormal bowel wall thickening or inflammatory changes. The appendix was not visualized; however there is no acute inflammatory process in the right lower quadrant. There are innumerable diverticula throughout the colon, without imaging signs of diverticulitis. Vascular/Lymphatic: No ascites or pneumoperitoneum. No abdominal or pelvic lymphadenopathy, by size criteria. No aneurysmal dilation of the major abdominal arteries. There are moderate peripheral atherosclerotic vascular calcifications of the aorta and its major branches. Reproductive: The uterus is surgically absent. No large adnexal mass. Other: There is a  tiny fat containing umbilical hernia. The soft tissues and abdominal wall are otherwise unremarkable. Musculoskeletal: No suspicious osseous lesions. There are moderate multilevel degenerative changes in the visualized spine. T9 and L1 vertebral body hemangiomas noted. Patient is status post bilateral hip arthroplasty. IMPRESSION: 1. No acute inflammatory process identified within the abdomen or pelvis. 2. There are bilateral sub 4 mm nonobstructing renal calculi. No ureterolithiasis or obstructive uropathy on either side. 3. There is a slightly lobulated 1.4 x 2.1 cm soft tissue attenuation nodule in the left breast, incompletely characterized on the current exam. Correlate clinically to determine the need for additional imaging. 4. Multiple other nonacute observations, as described above. Electronically Signed   By: Jules Schick M.D.   On: 02/07/2023 16:05   CT Head Wo Contrast  Result Date: 02/07/2023 CLINICAL DATA:  Provided history: Mental status change, unknown cause. EXAM: CT HEAD WITHOUT CONTRAST TECHNIQUE: Contiguous axial images were obtained from the base of the skull through the vertex without intravenous contrast. RADIATION DOSE REDUCTION: This exam was performed according to the departmental dose-optimization program which includes automated exposure control, adjustment of the mA and/or kV according to patient size and/or use of iterative reconstruction technique. COMPARISON:  Brain MRI 08/21/2016.  Head CT 08/21/2016. FINDINGS: Brain: Generalized cerebral atrophy. Patchy and ill-defined hypoattenuation within the cerebral white matter, nonspecific but compatible with mild chronic small vessel ischemic disease. Prominent perivascular space within the inferior left basal ganglia. Partially  empty sella turcica. There is no acute intracranial hemorrhage. No demarcated cortical infarct. No extra-axial fluid collection. No evidence of an intracranial mass. No midline shift. Vascular: No hyperdense  vessel.  Atherosclerotic calcifications. Skull: No calvarial fracture or aggressive osseous lesion. Sinuses/Orbits: No mass or acute finding within the imaged orbits. No significant paranasal sinus disease at imaged levels. IMPRESSION: 1. No evidence of an acute intracranial abnormality. 2. Parenchymal atrophy and chronic small vessel ischemic disease, as described. Electronically Signed   By: Jackey Loge D.O.   On: 02/07/2023 15:03    Labs on Admission: I have personally reviewed following labs  CBC: Recent Labs  Lab 02/07/23 1320  WBC 19.0*  NEUTROABS 16.0*  HGB 14.2  HCT 41.0  MCV 88.7  PLT 145*   Basic Metabolic Panel: Recent Labs  Lab 02/07/23 1320  NA 129*  K 5.0  CL 93*  CO2 20*  GLUCOSE 103*  BUN 95*  CREATININE 1.90*  CALCIUM 9.1   GFR: CrCl cannot be calculated (Unknown ideal weight.).  Urine analysis:    Component Value Date/Time   COLORURINE YELLOW (A) 02/07/2023 1321   APPEARANCEUR TURBID (A) 02/07/2023 1321   LABSPEC 1.012 02/07/2023 1321   PHURINE 5.0 02/07/2023 1321   GLUCOSEU NEGATIVE 02/07/2023 1321   HGBUR LARGE (A) 02/07/2023 1321   BILIRUBINUR NEGATIVE 02/07/2023 1321   KETONESUR NEGATIVE 02/07/2023 1321   PROTEINUR 100 (A) 02/07/2023 1321   NITRITE NEGATIVE 02/07/2023 1321   LEUKOCYTESUR MODERATE (A) 02/07/2023 1321   CRITICAL CARE Performed by: Dr. Sedalia Muta  Total critical care time: 35 minutes  Critical care time was exclusive of separately billable procedures and treating other patients.  Critical care was necessary to treat or prevent imminent or life-threatening deterioration.  Critical care was time spent personally by me on the following activities: development of treatment plan with patient's daughter and POA, [name] Noralyn Pick, as well as nursing, discussions with consultants, evaluation of patient's response to treatment, examination of patient, obtaining history from patient or surrogate, ordering and performing treatments and  interventions, ordering and review of laboratory studies, ordering and review of radiographic studies, pulse oximetry and re-evaluation of patient's condition.  This document was prepared using Dragon Voice Recognition software and may include unintentional dictation errors.  Dr. Sedalia Muta [facility]  If 7PM-7AM, please contact overnight-coverage provider If 7AM-7PM, please contact day attending provider [email or web address]  02/07/2023, 11:09 PM

## 2023-02-07 NOTE — ED Notes (Signed)
Pt yelling out "help me" nearly constantly. Pt states she wants someone to stay in room and change tv channel for her. Pt shown how to change tv channel. Pt is able to complete task independently.

## 2023-02-07 NOTE — ED Triage Notes (Signed)
Pt BIB EMS from [facility] where she lives indepently there. She's presenting today with AMS starting today. Pt was found incontinent, with foul urine smell, which is unlike her.

## 2023-02-07 NOTE — Hospital Course (Addendum)
Ms. Kathy Lowery is a 89 year old female with atrial fibrillation and flutter, quadriplegia functional, on chronic anticoagulation with Eliquis, history of intertrigo, hyperlipidemia, hypertension, depression, anxiety, GERD, who presents to the emergency department from [facility] via EMS for chief concerns of altered mental status.  Vitals in the ED showed temperature of 99.4, respiration rate 19, heart rate of 73, blood pressure 112/71, SpO2 of 93% on room air.  Serum sodium is 129, potassium 5.0, chloride 93, serum creatinine 1.90, eGFR 24, nonfasting blood glucose 103, WBC 19, hemoglobin 14.2, platelets of 145.  UA was positive for moderate leukocytes.  Blood cultures x 2 are in process.  Urine culture is in process.  ED treatment: Ceftriaxone 2 g IV 7-day course were ordered, sodium chloride 1 L bolus, acetaminophen 650 mg p.o. one-time dose  Blood cultures and urine cultures positive for E. coli.  Patient has been on Rocephin.  Physical therapy recommending rehab.  Patient lives at [facility].  12/16.  Waiting for insurance authorization for rehab.  Give a dose of Lasix. 12/17.  Did not receive insurance authorization early enough for rehab to take today.

## 2023-02-07 NOTE — Assessment & Plan Note (Addendum)
Creatinine 1.9 on presentation down to 0.72

## 2023-02-07 NOTE — Assessment & Plan Note (Addendum)
Present on admission with positive blood cultures and urine culture, acute kidney injury, acute respiratory failure, acute metabolic encephalopathy, thrombocytopenia, leukocytosis and tachypnea.  Patient received 7 days of Rocephin and now on Keflex.

## 2023-02-07 NOTE — Assessment & Plan Note (Signed)
 Severe, chronic

## 2023-02-07 NOTE — Assessment & Plan Note (Addendum)
Patient required supplemental oxygen.  Patient did have a pulse ox of 89% on 1.5 L.  Patient was on 1 L this morning but taken off oxygen 2 days ago.  Patient will go out to [facility] rehab and they can taper off oxygen there if we are unable to get off oxygen here.

## 2023-02-07 NOTE — Assessment & Plan Note (Addendum)
Improved get rid of bicarb tablets

## 2023-02-07 NOTE — Assessment & Plan Note (Addendum)
Restarted Lasix, spironolactone and low-dose Toprol.

## 2023-02-07 NOTE — Assessment & Plan Note (Deleted)
Will follow second HS Troponin Currently suspect secondary to demand ischemia Continue telemetry

## 2023-02-07 NOTE — Assessment & Plan Note (Deleted)
Apixaban 2.5 mg ordered on admission AM team to resume regular dosing pending clinical course, renal improvement as appropriate

## 2023-02-07 NOTE — Consult Note (Signed)
CODE SEPSIS - PHARMACY COMMUNICATION  **Broad-spectrum antimicrobials should be administered within one hour of sepsis diagnosis**  Time Code Sepsis call or page was received: 1707  Antibiotics ordered: Ceftriaxone  Time of first antibiotic administration: 1425  Additional action taken by pharmacy: N/A  If necessary, name of provider/nurse contacted: N/A    Will M. Dareen Piano, PharmD Clinical Pharmacist 02/07/2023 5:52 PM

## 2023-02-07 NOTE — ED Notes (Signed)
Pt was cleaned up, bed changed, new brief placed. Pt cont to c/o generalized back pain

## 2023-02-08 DIAGNOSIS — R6520 Severe sepsis without septic shock: Secondary | ICD-10-CM | POA: Diagnosis not present

## 2023-02-08 DIAGNOSIS — A419 Sepsis, unspecified organism: Secondary | ICD-10-CM | POA: Diagnosis not present

## 2023-02-08 LAB — SURGICAL PCR SCREEN
MRSA, PCR: NEGATIVE
Staphylococcus aureus: NEGATIVE

## 2023-02-08 LAB — BLOOD CULTURE ID PANEL (REFLEXED) - BCID2

## 2023-02-08 LAB — CBC
HCT: 36.7 % (ref 36.0–46.0)
Hemoglobin: 12.5 g/dL (ref 12.0–15.0)
MCH: 31.3 pg (ref 26.0–34.0)
MCHC: 34.1 g/dL (ref 30.0–36.0)
MCV: 92 fL (ref 80.0–100.0)
Platelets: 118 10*3/uL — ABNORMAL LOW (ref 150–400)
RBC: 3.99 MIL/uL (ref 3.87–5.11)
RDW: 15.6 % — ABNORMAL HIGH (ref 11.5–15.5)
WBC: 17.4 10*3/uL — ABNORMAL HIGH (ref 4.0–10.5)
nRBC: 0 % (ref 0.0–0.2)

## 2023-02-08 LAB — HEPATIC FUNCTION PANEL
ALT: 11 U/L (ref 0–44)
AST: 20 U/L (ref 15–41)
Albumin: 2.4 g/dL — ABNORMAL LOW (ref 3.5–5.0)
Alkaline Phosphatase: 96 U/L (ref 38–126)
Bilirubin, Direct: 0.5 mg/dL — ABNORMAL HIGH (ref 0.0–0.2)
Indirect Bilirubin: 1 mg/dL — ABNORMAL HIGH (ref 0.3–0.9)
Total Bilirubin: 1.5 mg/dL — ABNORMAL HIGH (ref <1.2)
Total Protein: 6 g/dL — ABNORMAL LOW (ref 6.5–8.1)

## 2023-02-08 LAB — BASIC METABOLIC PANEL
Anion gap: 10 (ref 5–15)
BUN: 74 mg/dL — ABNORMAL HIGH (ref 8–23)
CO2: 19 mmol/L — ABNORMAL LOW (ref 22–32)
Calcium: 8.2 mg/dL — ABNORMAL LOW (ref 8.9–10.3)
Chloride: 99 mmol/L (ref 98–111)
Creatinine, Ser: 1.44 mg/dL — ABNORMAL HIGH (ref 0.44–1.00)
GFR, Estimated: 33 mL/min — ABNORMAL LOW (ref >60)
Glucose, Bld: 117 mg/dL — ABNORMAL HIGH (ref 70–99)
Potassium: 3.5 mmol/L (ref 3.5–5.1)
Sodium: 128 mmol/L — ABNORMAL LOW (ref 135–145)

## 2023-02-08 LAB — TROPONIN I (HIGH SENSITIVITY): Troponin I (High Sensitivity): 32 ng/L — ABNORMAL HIGH (ref <18)

## 2023-02-08 LAB — PROCALCITONIN: Procalcitonin: 14.43 ng/mL

## 2023-02-08 MED ORDER — OXYCODONE-ACETAMINOPHEN 5-325 MG PO TABS
1.0000 | ORAL_TABLET | Freq: Four times a day (QID) | ORAL | Status: DC | PRN
  Administered 2023-02-08 – 2023-02-14 (×6): 1 via ORAL
  Filled 2023-02-08 (×6): qty 1

## 2023-02-08 MED ORDER — SODIUM CHLORIDE 0.9 % IV SOLN: INTRAVENOUS | Status: DC

## 2023-02-08 MED ORDER — SODIUM CHLORIDE 0.9 % IV SOLN
2.0000 g | INTRAVENOUS | Status: DC
  Administered 2023-02-08 – 2023-02-13 (×6): 2 g via INTRAVENOUS
  Filled 2023-02-08 (×6): qty 20

## 2023-02-08 MED ORDER — MORPHINE SULFATE (PF) 2 MG/ML IV SOLN: 1.0000 mg | INTRAVENOUS | Status: DC | PRN

## 2023-02-08 MED ORDER — ORAL CARE MOUTH RINSE: 15.0000 mL | OROMUCOSAL | Status: DC | PRN

## 2023-02-08 NOTE — Progress Notes (Signed)
PHARMACY - PHYSICIAN COMMUNICATION CRITICAL VALUE ALERT - BLOOD CULTURE IDENTIFICATION (BCID)  Results for orders placed or performed during the hospital encounter of 02/07/23  Blood culture (routine x 2)     Status: None (Preliminary result)   Collection Time: 02/07/23  1:20 PM   Specimen: BLOOD  Result Value Ref Range Status   Specimen Description BLOOD RIGTH ARM  Final   Special Requests   Final    BOTTLES DRAWN AEROBIC AND ANAEROBIC Blood Culture results may not be optimal due to an inadequate volume of blood received in culture bottles   Culture  Setup Time   Final    Organism ID to follow GRAM NEGATIVE RODS IN BOTH AEROBIC AND ANAEROBIC BOTTLES CRITICAL RESULT CALLED TO, READ BACK BY AND VERIFIED WITH: Haile Toppins PHARMD @0039  02/08/23 ASW Performed at [facility], [address] [place], [place] [address]    Culture GRAM NEGATIVE RODS  Final   Report Status PENDING  Incomplete  Blood culture (routine x 2)     Status: None (Preliminary result)   Collection Time: 02/07/23  1:20 PM   Specimen: BLOOD  Result Value Ref Range Status   Specimen Description BLOOD LEFT ARM  Final   Special Requests   Final    BOTTLES DRAWN AEROBIC AND ANAEROBIC Blood Culture results may not be optimal due to an inadequate volume of blood received in culture bottles   Culture  Setup Time   Final    GRAM NEGATIVE RODS IN BOTH AEROBIC AND ANAEROBIC BOTTLES CRITICAL VALUE NOTED.  VALUE IS CONSISTENT WITH PREVIOUSLY REPORTED AND CALLED VALUE. Performed at [facility], [address] [place], [place] [address]    Culture GRAM NEGATIVE RODS  Final   Report Status PENDING  Incomplete  Blood Culture ID Panel (Reflexed)     Status: Abnormal   Collection Time: 02/07/23  1:20 PM  Result Value Ref Range Status   Enterococcus faecalis NOT DETECTED NOT DETECTED Final   Enterococcus Faecium NOT DETECTED NOT DETECTED Final   Listeria monocytogenes NOT DETECTED NOT DETECTED Final    Staphylococcus species NOT DETECTED NOT DETECTED Final   Staphylococcus aureus (BCID) NOT DETECTED NOT DETECTED Final   Staphylococcus epidermidis NOT DETECTED NOT DETECTED Final   Staphylococcus lugdunensis NOT DETECTED NOT DETECTED Final   Streptococcus species NOT DETECTED NOT DETECTED Final   Streptococcus agalactiae NOT DETECTED NOT DETECTED Final   Streptococcus pneumoniae NOT DETECTED NOT DETECTED Final   Streptococcus pyogenes NOT DETECTED NOT DETECTED Final   A.calcoaceticus-baumannii NOT DETECTED NOT DETECTED Final   Bacteroides fragilis NOT DETECTED NOT DETECTED Final   Enterobacterales DETECTED (A) NOT DETECTED Final    Comment: Enterobacterales represent a large order of gram negative bacteria, not a single organism. CRITICAL RESULT CALLED TO, READ BACK BY AND VERIFIED WITH: Toshio Slusher PHARMD @0039  02/08/23 ASW    Enterobacter cloacae complex NOT DETECTED NOT DETECTED Final   Escherichia coli DETECTED (A) NOT DETECTED Final    Comment: CRITICAL RESULT CALLED TO, READ BACK BY AND VERIFIED WITH: Ladon Vandenberghe PHARMD @0039  02/08/23 ASW    Klebsiella aerogenes NOT DETECTED NOT DETECTED Final   Klebsiella oxytoca NOT DETECTED NOT DETECTED Final   Klebsiella pneumoniae NOT DETECTED NOT DETECTED Final   Proteus species NOT DETECTED NOT DETECTED Final   Salmonella species NOT DETECTED NOT DETECTED Final   Serratia marcescens NOT DETECTED NOT DETECTED Final   Haemophilus influenzae NOT DETECTED NOT DETECTED Final   Neisseria meningitidis NOT DETECTED NOT  DETECTED Final   Pseudomonas aeruginosa NOT DETECTED NOT DETECTED Final   Stenotrophomonas maltophilia NOT DETECTED NOT DETECTED Final   Candida albicans NOT DETECTED NOT DETECTED Final   Candida auris NOT DETECTED NOT DETECTED Final   Candida glabrata NOT DETECTED NOT DETECTED Final   Candida krusei NOT DETECTED NOT DETECTED Final   Candida parapsilosis NOT DETECTED NOT DETECTED Final   Candida tropicalis NOT DETECTED NOT  DETECTED Final   Cryptococcus neoformans/gattii NOT DETECTED NOT DETECTED Final   CTX-M ESBL NOT DETECTED NOT DETECTED Final   Carbapenem resistance IMP NOT DETECTED NOT DETECTED Final   Carbapenem resistance KPC NOT DETECTED NOT DETECTED Final   Carbapenem resistance NDM NOT DETECTED NOT DETECTED Final   Carbapenem resist OXA 48 LIKE NOT DETECTED NOT DETECTED Final   Carbapenem resistance VIM NOT DETECTED NOT DETECTED Final    Comment: Performed at [facility], [address] [place], [place] [address]  Urine Culture     Status: None (Preliminary result)   Collection Time: 02/07/23  1:21 PM   Specimen: Urine, Catheterized  Result Value Ref Range Status   Specimen Description   Final    URINE, CATHETERIZED Performed at [facility], [address], [place], [place] [address]    Special Requests   Final    NONE Reflexed from [id number] Performed at [facility], [address] [place] [place], [place] [address]    Culture PENDING  Incomplete   Report Status PENDING  Incomplete    BCID Results: 4 of 4 bottles w/ E. Coli, no resistance.  Pt currently ordered Cefepime & Vancomycin, and Azithromycin for CAP.  Name of provider contacted: Randye Lobo, NP   Changes to prescribed antibiotics required: Transition pt to Ceftriaxone 2gm, continue Azithromycin for CAP.  Otelia Sergeant, PharmD, [facility] 02/08/2023 12:53 AM

## 2023-02-08 NOTE — ED Notes (Addendum)
Pt c/o back pain.Transferred Pt to hospital bed, placed pillow underneath R side back/butt, Pt refuses to lay on side. Pt sat up to eat breakfast

## 2023-02-08 NOTE — ED Notes (Signed)
Pt cleansed of incontinent urine and stool, repositioned in bed, pt refuses to lie on side. Pt with stage 2 ulcer noted to sacrum. Clothing placed in belonging bag at bedside, fresh gown placed. Non skid socks placed on feet. Po fluids offered and declined. Sacral dressing placed.

## 2023-02-08 NOTE — Progress Notes (Signed)
PROGRESS NOTE    Kathy Lowery  ZOX:[id number] DOB: May 08, 1924 DOA: 02/07/2023 PCP: Earnestine Mealing, MD    Assessment & Plan:   Principal Problem:   Severe sepsis with acute organ dysfunction (HCC) Active Problems:   AKI (acute kidney injury) (HCC)   Acute hypoxic respiratory failure (HCC)   Essential hypertension   Coronary artery disease   Atrial fibrillation and flutter (HCC)   Hyperlipidemia, mixed   Quadriplegia, functional (HCC)   PAD (peripheral artery disease) (HCC)   Mild cognitive impairment   Altered mental state   Hard of hearing   Metabolic acidosis   Elevated troponin  Assessment and Plan: Severe sepsis: see Dr. Renea Ee notes on how pt met severe sepsis criteria. Possibly secondary to aspiration pneumonia & bacteremia. Continue on IV rocephin, azithromycin, bronchodilators & encourage incentive spirometry.   Bacteremia: blood cultures growing from e. Coli. Source unknown. Urine culture is pending. Continue on IV rocephin.    Acute hypoxic respiratory failure: likely secondary to pneumonia. Continue on IV abxs. Continue on supplemental oxygen and wean as tolerated.    AKI: Cr is trending down from day prior. Avoid nephrotoxic meds   HTN: holding home propranolol, aldactone, lasix as BP is on low end of normal    Elevated troponin: likely secondary to demand ischemia   Metabolic acidosis: continue on IVFs    Hard of hearing: chronic. Continue w/ supportive care   Encephalopathy: likely secondary to above infections, pneumonia & bacteremia. Continue on IV abxs    Quadriplegia: functional. Continue w/ supportive care    Likely PAF: continue on eliquis. Holding home propranolol.         DVT prophylaxis: eliquis  Code Status: DNR Family Communication: Disposition Plan: PT/OT will need to see pt   Level of care: Progressive Status is: Inpatient Remains inpatient appropriate because: severity of illness    Consultants:    Procedures:    Antimicrobials: rocephin, azithromycin    Subjective: Pt c/o fatigue   Objective: Vitals:   02/08/23 0318 02/08/23 0400 02/08/23 0500 02/08/23 0800  BP:  128/60 (!) 124/56 118/66  Pulse:  66 69 60  Resp:  19 (!) 21 (!) 21  Temp: 97.9 F (36.6 C)     TempSrc: Axillary     SpO2:  97% 95% 96%    Intake/Output Summary (Last 24 hours) at 02/08/2023 8119 Last data filed at 02/07/2023 2324 Gross per 24 hour  Intake 200 ml  Output --  Net 200 ml   There were no vitals filed for this visit.  Examination:  General exam: Appears calm and comfortable  Respiratory system: Clear to auscultation. Respiratory effort normal. Cardiovascular system: S1 & S2+. No rubs, gallops or clicks.  Gastrointestinal system: Abdomen is nondistended, soft and nontender. . Normal bowel sounds heard. Central nervous system: Alert and awake Psychiatry: Judgement and insight appears poor. Flat mood and affect     Data Reviewed: I have personally reviewed following labs and imaging studies  CBC: Recent Labs  Lab 02/07/23 1320 02/08/23 0320  WBC 19.0* 17.4*  NEUTROABS 16.0*  --   HGB 14.2 12.5  HCT 41.0 36.7  MCV 88.7 92.0  PLT 145* 118*   Basic Metabolic Panel: Recent Labs  Lab 02/07/23 1320 02/08/23 0320  NA 129* 128*  K 5.0 3.5  CL 93* 99  CO2 20* 19*  GLUCOSE 103* 117*  BUN 95* 74*  CREATININE 1.90* 1.44*  CALCIUM 9.1 8.2*   GFR: CrCl cannot be calculated (Unknown ideal weight.).  Liver Function Tests: Recent Labs  Lab 02/07/23 2148 02/08/23 0320  AST 19 20  ALT 10 11  ALKPHOS 97 96  BILITOT 1.0 1.5*  PROT 5.9* 6.0*  ALBUMIN 2.4* 2.4*   No results for input(s): "LIPASE", "AMYLASE" in the last 168 hours. No results for input(s): "AMMONIA" in the last 168 hours. Coagulation Profile: No results for input(s): "INR", "PROTIME" in the last 168 hours. Cardiac Enzymes: No results for input(s): "CKTOTAL", "CKMB", "CKMBINDEX", "TROPONINI" in the last 168 hours. BNP (last 3  results) No results for input(s): "PROBNP" in the last 8760 hours. HbA1C: No results for input(s): "HGBA1C" in the last 72 hours. CBG: No results for input(s): "GLUCAP" in the last 168 hours. Lipid Profile: No results for input(s): "CHOL", "HDL", "LDLCALC", "TRIG", "CHOLHDL", "LDLDIRECT" in the last 72 hours. Thyroid Function Tests: No results for input(s): "TSH", "T4TOTAL", "FREET4", "T3FREE", "THYROIDAB" in the last 72 hours. Anemia Panel: No results for input(s): "VITAMINB12", "FOLATE", "FERRITIN", "TIBC", "IRON", "RETICCTPCT" in the last 72 hours. Sepsis Labs: Recent Labs  Lab 02/07/23 1320 02/07/23 2148 02/08/23 0320  PROCALCITON 27.93  --  14.43  LATICACIDVEN 1.6 0.9  --     Recent Results (from the past 240 hour(s))  Blood culture (routine x 2)     Status: None (Preliminary result)   Collection Time: 02/07/23  1:20 PM   Specimen: BLOOD  Result Value Ref Range Status   Specimen Description   Final    BLOOD RIGTH ARM Performed at [facility], [address], [place], [place] [address]    Special Requests   Final    BOTTLES DRAWN AEROBIC AND ANAEROBIC Blood Culture results may not be optimal due to an inadequate volume of blood received in culture bottles Performed at [facility], [address] [place], [place] [address]    Culture  Setup Time   Final    Organism ID to follow GRAM NEGATIVE RODS IN BOTH AEROBIC AND ANAEROBIC BOTTLES CRITICAL RESULT CALLED TO, READ BACK BY AND VERIFIED WITH: NATHAN BELUE PHARMD @0039  02/08/23 ASW    Culture GRAM NEGATIVE RODS  Final   Report Status PENDING  Incomplete  Blood culture (routine x 2)     Status: None (Preliminary result)   Collection Time: 02/07/23  1:20 PM   Specimen: BLOOD  Result Value Ref Range Status   Specimen Description BLOOD LEFT ARM  Final   Special Requests   Final    BOTTLES DRAWN AEROBIC AND ANAEROBIC Blood Culture results may not be optimal due to an inadequate volume of blood  received in culture bottles   Culture  Setup Time   Final    GRAM NEGATIVE RODS IN BOTH AEROBIC AND ANAEROBIC BOTTLES CRITICAL VALUE NOTED.  VALUE IS CONSISTENT WITH PREVIOUSLY REPORTED AND CALLED VALUE. Performed at [facility], [address] [place], [place] [address]    Culture GRAM NEGATIVE RODS  Final   Report Status PENDING  Incomplete  Blood Culture ID Panel (Reflexed)     Status: Abnormal   Collection Time: 02/07/23  1:20 PM  Result Value Ref Range Status   Enterococcus faecalis NOT DETECTED NOT DETECTED Final   Enterococcus Faecium NOT DETECTED NOT DETECTED Final   Listeria monocytogenes NOT DETECTED NOT DETECTED Final   Staphylococcus species NOT DETECTED NOT DETECTED Final   Staphylococcus aureus (BCID) NOT DETECTED NOT DETECTED Final   Staphylococcus epidermidis NOT DETECTED NOT DETECTED Final   Staphylococcus lugdunensis NOT DETECTED NOT DETECTED Final  Streptococcus species NOT DETECTED NOT DETECTED Final   Streptococcus agalactiae NOT DETECTED NOT DETECTED Final   Streptococcus pneumoniae NOT DETECTED NOT DETECTED Final   Streptococcus pyogenes NOT DETECTED NOT DETECTED Final   A.calcoaceticus-baumannii NOT DETECTED NOT DETECTED Final   Bacteroides fragilis NOT DETECTED NOT DETECTED Final   Enterobacterales DETECTED (A) NOT DETECTED Final    Comment: Enterobacterales represent a large order of gram negative bacteria, not a single organism. CRITICAL RESULT CALLED TO, READ BACK BY AND VERIFIED WITH: NATHAN BELUE PHARMD @0039  02/08/23 ASW    Enterobacter cloacae complex NOT DETECTED NOT DETECTED Final   Escherichia coli DETECTED (A) NOT DETECTED Final    Comment: CRITICAL RESULT CALLED TO, READ BACK BY AND VERIFIED WITH: NATHAN BELUE PHARMD @0039  02/08/23 ASW    Klebsiella aerogenes NOT DETECTED NOT DETECTED Final   Klebsiella oxytoca NOT DETECTED NOT DETECTED Final   Klebsiella pneumoniae NOT DETECTED NOT DETECTED Final   Proteus species NOT DETECTED  NOT DETECTED Final   Salmonella species NOT DETECTED NOT DETECTED Final   Serratia marcescens NOT DETECTED NOT DETECTED Final   Haemophilus influenzae NOT DETECTED NOT DETECTED Final   Neisseria meningitidis NOT DETECTED NOT DETECTED Final   Pseudomonas aeruginosa NOT DETECTED NOT DETECTED Final   Stenotrophomonas maltophilia NOT DETECTED NOT DETECTED Final   Candida albicans NOT DETECTED NOT DETECTED Final   Candida auris NOT DETECTED NOT DETECTED Final   Candida glabrata NOT DETECTED NOT DETECTED Final   Candida krusei NOT DETECTED NOT DETECTED Final   Candida parapsilosis NOT DETECTED NOT DETECTED Final   Candida tropicalis NOT DETECTED NOT DETECTED Final   Cryptococcus neoformans/gattii NOT DETECTED NOT DETECTED Final   CTX-M ESBL NOT DETECTED NOT DETECTED Final   Carbapenem resistance IMP NOT DETECTED NOT DETECTED Final   Carbapenem resistance KPC NOT DETECTED NOT DETECTED Final   Carbapenem resistance NDM NOT DETECTED NOT DETECTED Final   Carbapenem resist OXA 48 LIKE NOT DETECTED NOT DETECTED Final   Carbapenem resistance VIM NOT DETECTED NOT DETECTED Final    Comment: Performed at [facility], [address] [place], [place] [address]  Urine Culture     Status: None (Preliminary result)   Collection Time: 02/07/23  1:21 PM   Specimen: Urine, Catheterized  Result Value Ref Range Status   Specimen Description   Final    URINE, CATHETERIZED Performed at [facility], [address] [place], [place] [address]    Special Requests   Final    NONE Reflexed from [id number] Performed at [facility], [address] [place] [place], [place] [address]    Culture PENDING  Incomplete   Report Status PENDING  Incomplete  Surgical pcr screen     Status: None   Collection Time: 02/08/23  3:20 AM   Specimen: Nasal Mucosa; Nasal Swab  Result Value Ref Range Status   MRSA, PCR NEGATIVE NEGATIVE Final   Staphylococcus aureus NEGATIVE NEGATIVE Final    Comment:  (NOTE) The Xpert SA Assay (FDA approved for NASAL specimens in patients 3 years of age and older), is one component of a comprehensive surveillance program. It is not intended to diagnose infection nor to guide or monitor treatment. Performed at [facility], [address], [place], [place] [address]          Radiology Studies: [facility] Chest Landing 1 View  Result Date: 02/07/2023 CLINICAL DATA:  Cough, short of breath EXAM: PORTABLE CHEST 1 VIEW COMPARISON:  08/21/2016 FINDINGS: Single frontal view  of the chest demonstrates stable enlargement of the cardiac silhouette. Moderate hiatal hernia. There is patchy bilateral airspace disease, most pronounced in the right upper lobe abutting the minor fissure. Atelectatic changes are again seen at the lung bases. No effusion or pneumothorax. Chronic elevation of the right hemidiaphragm. No acute bony abnormalities. IMPRESSION: 1. Patchy bilateral perihilar airspace disease most pronounced in the right upper lobe, compatible with bronchopneumonia. 2. Hiatal hernia. Electronically Signed   By: Sharlet Salina M.D.   On: 02/07/2023 20:45   CT ABDOMEN PELVIS WO CONTRAST  Result Date: 02/07/2023 CLINICAL DATA:  UTI, recurrent/complicated (Female). Altered mental status. EXAM: CT ABDOMEN AND PELVIS WITHOUT CONTRAST TECHNIQUE: Multidetector CT imaging of the abdomen and pelvis was performed following the standard protocol without IV contrast. RADIATION DOSE REDUCTION: This exam was performed according to the departmental dose-optimization program which includes automated exposure control, adjustment of the mA and/or kV according to patient size and/or use of iterative reconstruction technique. COMPARISON:  None Available. FINDINGS: Lower chest: There are patchy atelectatic changes in the visualized lung bases. No overt consolidation. No pleural effusion. The heart is normal in size. No pericardial effusion. There is a slightly lobulated 1.4 x 2.1 cm soft  tissue attenuation nodule in the left breast, which is incompletely characterized on the current exam. Correlate clinically to determine the need for additional imaging. Hepatobiliary: The liver is normal in size. Non-cirrhotic configuration. No suspicious mass. No intrahepatic or extrahepatic bile duct dilation. Small volume dependent peripherally rim calcified gallstones noted without imaging signs of acute cholecystitis. Normal gallbladder wall thickness. No pericholecystic inflammatory changes. Pancreas: Unremarkable. No pancreatic ductal dilatation or surrounding inflammatory changes. Spleen: Within normal limits. No focal lesion. Adrenals/Urinary Tract: Adrenal glands are unremarkable. Evaluation of bilateral kidneys is limited due to lack of intravenous contrast. There is a partially exophytic cyst arising from the right kidney interpolar region, anteromedially measuring 2.5 x 3.1 cm. There are additional smaller cysts in bilateral kidneys. There are at least 2, sub 4 mm nonobstructing calculi in the right kidney and a single punctate nonobstructing calculus in the left kidney lower pole. No ureterolithiasis on either side. No hydroureteronephrosis. Urinary bladder is under distended, precluding optimal assessment. However, no large mass or stones identified. No perivesical fat stranding. Stomach/Bowel: Small-to-moderate hiatal hernia noted. No disproportionate dilation of the small or large bowel loops. No evidence of abnormal bowel wall thickening or inflammatory changes. The appendix was not visualized; however there is no acute inflammatory process in the right lower quadrant. There are innumerable diverticula throughout the colon, without imaging signs of diverticulitis. Vascular/Lymphatic: No ascites or pneumoperitoneum. No abdominal or pelvic lymphadenopathy, by size criteria. No aneurysmal dilation of the major abdominal arteries. There are moderate peripheral atherosclerotic vascular calcifications  of the aorta and its major branches. Reproductive: The uterus is surgically absent. No large adnexal mass. Other: There is a tiny fat containing umbilical hernia. The soft tissues and abdominal wall are otherwise unremarkable. Musculoskeletal: No suspicious osseous lesions. There are moderate multilevel degenerative changes in the visualized spine. T9 and L1 vertebral body hemangiomas noted. Patient is status post bilateral hip arthroplasty. IMPRESSION: 1. No acute inflammatory process identified within the abdomen or pelvis. 2. There are bilateral sub 4 mm nonobstructing renal calculi. No ureterolithiasis or obstructive uropathy on either side. 3. There is a slightly lobulated 1.4 x 2.1 cm soft tissue attenuation nodule in the left breast, incompletely characterized on the current exam. Correlate clinically to determine the need for additional imaging. 4. Multiple  other nonacute observations, as described above. Electronically Signed   By: Jules Schick M.D.   On: 02/07/2023 16:05   CT Head Wo Contrast  Result Date: 02/07/2023 CLINICAL DATA:  Provided history: Mental status change, unknown cause. EXAM: CT HEAD WITHOUT CONTRAST TECHNIQUE: Contiguous axial images were obtained from the base of the skull through the vertex without intravenous contrast. RADIATION DOSE REDUCTION: This exam was performed according to the departmental dose-optimization program which includes automated exposure control, adjustment of the mA and/or kV according to patient size and/or use of iterative reconstruction technique. COMPARISON:  Brain MRI 08/21/2016.  Head CT 08/21/2016. FINDINGS: Brain: Generalized cerebral atrophy. Patchy and ill-defined hypoattenuation within the cerebral white matter, nonspecific but compatible with mild chronic small vessel ischemic disease. Prominent perivascular space within the inferior left basal ganglia. Partially empty sella turcica. There is no acute intracranial hemorrhage. No demarcated  cortical infarct. No extra-axial fluid collection. No evidence of an intracranial mass. No midline shift. Vascular: No hyperdense vessel.  Atherosclerotic calcifications. Skull: No calvarial fracture or aggressive osseous lesion. Sinuses/Orbits: No mass or acute finding within the imaged orbits. No significant paranasal sinus disease at imaged levels. IMPRESSION: 1. No evidence of an acute intracranial abnormality. 2. Parenchymal atrophy and chronic small vessel ischemic disease, as described. Electronically Signed   By: Jackey Loge D.O.   On: 02/07/2023 15:03        Scheduled Meds:  apixaban  2.5 mg Oral BID   Continuous Infusions:  sodium chloride 125 mL/hr at 02/08/23 0159   azithromycin Stopped (02/07/23 2216)   cefTRIAXone (ROCEPHIN)  IV       LOS: 1 day       Charise Killian, MD [facility] Pager [phone]  If 7PM-7AM, please contact night-coverage [email or web address] 02/08/2023, 8:23 AM

## 2023-02-08 NOTE — ED Notes (Signed)
Pt cleansed of incontinent urine again. Pt states she is uncomfortable on bed. Pt informed multiple times have an admission be that is more comfortable coming after 7am. Pt does not verbalize understanding.

## 2023-02-08 NOTE — ED Notes (Signed)
Po fluids provided, no choking noted. Pt is currently dry, no incontinence noted. Pt denies other needs.

## 2023-02-08 NOTE — ED Notes (Signed)
Pt currently dry, no incontinence noted. Pt refuses to place hospital gown on. Po fluids offered. Pt consumed approx of water PO without difficulty.

## 2023-02-08 NOTE — ED Notes (Signed)
Pt watching tv in no acute distress.  

## 2023-02-08 NOTE — ED Notes (Signed)
Report to [place], rn

## 2023-02-08 NOTE — ED Notes (Signed)
Pt c/o being uncomfortable, readjusted pillow to L side at this time

## 2023-02-08 NOTE — ED Notes (Signed)
Pt sleeping, head of bed elevated.

## 2023-02-09 DIAGNOSIS — R6520 Severe sepsis without septic shock: Secondary | ICD-10-CM | POA: Diagnosis not present

## 2023-02-09 DIAGNOSIS — A419 Sepsis, unspecified organism: Secondary | ICD-10-CM | POA: Diagnosis not present

## 2023-02-09 MED ORDER — AZITHROMYCIN 250 MG PO TABS
500.0000 mg | ORAL_TABLET | Freq: Every day | ORAL | Status: AC
  Administered 2023-02-09 – 2023-02-11 (×3): 500 mg via ORAL
  Filled 2023-02-09 (×3): qty 2

## 2023-02-09 MED ORDER — SODIUM BICARBONATE 650 MG PO TABS
650.0000 mg | ORAL_TABLET | Freq: Two times a day (BID) | ORAL | Status: DC
  Administered 2023-02-09 – 2023-02-12 (×6): 650 mg via ORAL
  Filled 2023-02-09 (×6): qty 1

## 2023-02-09 NOTE — TOC Initial Note (Signed)
Transition of Care [facility]) - Initial/Assessment Note    Patient Details  Name: Kathy Lowery MRN: [id number] Date of Birth: [birth year]  Transition of Care [facility]) CM/SW Contact:    Truddie Hidden, RN Phone Number: 02/09/2023, 1:09 PM  Clinical Narrative:                 Patient is from [facility].          Patient Goals and CMS Choice            Expected Discharge Plan and Services                                              Prior Living Arrangements/Services                       Activities of Daily Living      Permission Sought/Granted                  Emotional Assessment              Admission diagnosis:  Acute cystitis without hematuria [N30.00] Acute encephalopathy [G93.40] Severe sepsis with acute organ dysfunction (HCC) [A41.9, R65.20] Patient Active Problem List   Diagnosis Date Noted   Severe sepsis with acute organ dysfunction (HCC) 02/07/2023   AKI (acute kidney injury) (HCC) 02/07/2023   Altered mental state 02/07/2023   Hard of hearing 02/07/2023   Acute hypoxic respiratory failure (HCC) 02/07/2023   Metabolic acidosis 02/07/2023   Elevated troponin 02/07/2023   PAD (peripheral artery disease) (HCC) 12/07/2022   Mild cognitive impairment 12/07/2022   Venous stasis dermatitis 12/07/2022   Mycotic toenails 12/07/2022   Quadriplegia, functional (HCC) 11/22/2021   Atrial fibrillation and flutter (HCC) 04/26/2018   Hyperlipidemia, mixed 03/29/2016   Medicare annual wellness visit, initial 03/29/2016   Chronic atrial fibrillation (HCC)    Coronary artery disease    NSTEMI (non-ST elevated myocardial infarction) (HCC) 04/22/2015   Current use of long term anticoagulation 04/22/2015   Essential hypertension 04/22/2015   Obesity (BMI 30-39.9) 04/22/2015   Hypokalemia 04/22/2015   CHF (congestive heart failure) (HCC) 04/19/2015   PCP:  Earnestine Mealing, MD Pharmacy:   [facility] [place],  [place] - [address] [place] [address] Phone: [phone] Fax: [phone]     Social Drivers of Health (SDOH) Social History: SDOH Screenings   Food Insecurity: Patient Unable To Answer (02/08/2023)  Housing: Patient Unable To Answer (02/08/2023)  Transportation Needs: Patient Unable To Answer (02/08/2023)  Utilities: Not At Risk (02/08/2023)  Depression (PHQ2-9): Low Risk  (12/07/2022)  Financial Resource Strain: Low Risk  (05/31/2022)   Received from [facility]  Tobacco Use: Medium Risk (02/07/2023)   SDOH Interventions:     Readmission Risk Interventions     No data to display

## 2023-02-09 NOTE — Progress Notes (Signed)
PHARMACIST - PHYSICIAN COMMUNICATION DR:   TRH CONCERNING: Antibiotic IV to Oral Route Change Policy  RECOMMENDATION: This patient is receiving azithromycin by the intravenous route.  Based on criteria approved by the Pharmacy and Therapeutics Committee, the antibiotic(s) is/are being converted to the equivalent oral dose form(s).   DESCRIPTION: These criteria include: Patient being treated for a respiratory tract infection, urinary tract infection, cellulitis or clostridium difficile associated diarrhea if on metronidazole The patient is not neutropenic and does not exhibit a GI malabsorption state The patient is eating (either orally or via tube) and/or has been taking other orally administered medications for a least 24 hours The patient is improving clinically and has a Tmax < 100.5  If you have questions about this conversion, please contact the Pharmacy Department  []   [phone] )  [facility] [x]   [phone] )  [facility] []   [phone] )  [facility] []   [phone] )  [facility] []   [phone] )  [facility]     Juliette Alcide, PharmD, [place], [place] Work Cell: [phone] 02/09/2023 3:15 PM

## 2023-02-09 NOTE — Progress Notes (Signed)
PROGRESS NOTE    Kathy Lowery  ZOX:[id number] DOB: May 13, 1924 DOA: 02/07/2023 PCP: Earnestine Mealing, MD    Assessment & Plan:   Principal Problem:   Severe sepsis with acute organ dysfunction (HCC) Active Problems:   AKI (acute kidney injury) (HCC)   Acute hypoxic respiratory failure (HCC)   Essential hypertension   Coronary artery disease   Atrial fibrillation and flutter (HCC)   Hyperlipidemia, mixed   Quadriplegia, functional (HCC)   PAD (peripheral artery disease) (HCC)   Mild cognitive impairment   Altered mental state   Hard of hearing   Metabolic acidosis   Elevated troponin  Assessment and Plan: Severe sepsis: see Dr. Renea Ee notes on how pt met severe sepsis criteria. Possibly secondary to aspiration pneumonia & bacteremia. Continue on IV rocephin, azithromycin, bronchodilators & encourage incentive spirometry   Likely aspiration pneumonia: continue on IV rocephin, azithromycin, bronchodilators. Speech consulted as concern for aspiration w/ eating and drinking. Encourage incentive spirometry   UTI: urine cx is growing e. coli, sens pending. Continue on IV rocephin  Bacteremia: blood cultures growing from e. coli. Likely secondary to UTI. Continue on IV rocephin    Acute hypoxic respiratory failure: likely secondary to pneumonia. Continue on IV abxs. Weaned off of supplemental oxygen    AKI: Cr continues to trending down from day prior.  Avoid nephrotoxic meds   Hyponatremia: etiology unclear, ? SIADH. Trending slight from day prior.   HTN: holding home propranolol, aldactone, lasix as BP is on low end of normal    Elevated troponin: likely secondary to demand ischemia   Metabolic acidosis: will start sodium bicarb    Hard of hearing: chronic. Continue w/ supportive care   Encephalopathy: likely secondary to above infections, pneumonia & bacteremia. Continue on IV abxs    Quadriplegia: functional. Continue w/ supportive care    Likely PAF: continue on  eliquis. Holding home dose of propranolol    Obesity: BMI 30.9. Would benefit from weight loss      DVT prophylaxis: eliquis  Code Status: DNR Family Communication: called pt's daugther, [name] but no answer so I left a  voicemail  Disposition Plan: PT/OT needs to see pt   Level of care: Progressive Status is: Inpatient Remains inpatient appropriate because: severity of illness    Consultants:    Procedures:   Antimicrobials: rocephin, azithromycin    Subjective: Pt c/o malaise   Objective: Vitals:   02/09/23 0317 02/09/23 0838 02/09/23 1229 02/09/23 1313  BP: (!) 115/52 (!) 113/98 (!) 114/49   Pulse: (!) 53 83 65   Resp: 18 20 20    Temp:  97.8 F (36.6 C) 98 F (36.7 C)   TempSrc:  Oral    SpO2: 92% 100% 97%   Weight:    80.4 kg    Intake/Output Summary (Last 24 hours) at 02/09/2023 1500 Last data filed at 02/09/2023 1055 Gross per 24 hour  Intake 3695.37 ml  Output --  Net 3695.37 ml   Filed Weights   02/09/23 1313  Weight: 80.4 kg    Examination:  General exam: appears uncomfortable  Respiratory system: course breath sounds b/l  Cardiovascular system: S1/S2+. No rubs or clicks  Gastrointestinal system: abd is soft, NT, obese & hypoactive bowel sounds Central nervous system: alert & awake  Psychiatry: Judgement and insight appears poor. Flat mood and affect    Data Reviewed: I have personally reviewed following labs and imaging studies  CBC: Recent Labs  Lab 02/07/23 1320 02/08/23 0320  WBC 19.0*  17.4*  NEUTROABS 16.0*  --   HGB 14.2 12.5  HCT 41.0 36.7  MCV 88.7 92.0  PLT 145* 118*   Basic Metabolic Panel: Recent Labs  Lab 02/07/23 1320 02/08/23 0320  NA 129* 128*  K 5.0 3.5  CL 93* 99  CO2 20* 19*  GLUCOSE 103* 117*  BUN 95* 74*  CREATININE 1.90* 1.44*  CALCIUM 9.1 8.2*   GFR: Estimated Creatinine Clearance: 23.2 mL/min (A) (by C-G formula based on SCr of 1.44 mg/dL (H)). Liver Function Tests: Recent Labs  Lab  02/07/23 2148 02/08/23 0320  AST 19 20  ALT 10 11  ALKPHOS 97 96  BILITOT 1.0 1.5*  PROT 5.9* 6.0*  ALBUMIN 2.4* 2.4*   No results for input(s): "LIPASE", "AMYLASE" in the last 168 hours. No results for input(s): "AMMONIA" in the last 168 hours. Coagulation Profile: No results for input(s): "INR", "PROTIME" in the last 168 hours. Cardiac Enzymes: No results for input(s): "CKTOTAL", "CKMB", "CKMBINDEX", "TROPONINI" in the last 168 hours. BNP (last 3 results) No results for input(s): "PROBNP" in the last 8760 hours. HbA1C: No results for input(s): "HGBA1C" in the last 72 hours. CBG: No results for input(s): "GLUCAP" in the last 168 hours. Lipid Profile: No results for input(s): "CHOL", "HDL", "LDLCALC", "TRIG", "CHOLHDL", "LDLDIRECT" in the last 72 hours. Thyroid Function Tests: No results for input(s): "TSH", "T4TOTAL", "FREET4", "T3FREE", "THYROIDAB" in the last 72 hours. Anemia Panel: No results for input(s): "VITAMINB12", "FOLATE", "FERRITIN", "TIBC", "IRON", "RETICCTPCT" in the last 72 hours. Sepsis Labs: Recent Labs  Lab 02/07/23 1320 02/07/23 2148 02/08/23 0320  PROCALCITON 27.93  --  14.43  LATICACIDVEN 1.6 0.9  --     Recent Results (from the past 240 hours)  Blood culture (routine x 2)     Status: Abnormal (Preliminary result)   Collection Time: 02/07/23  1:20 PM   Specimen: BLOOD  Result Value Ref Range Status   Specimen Description   Final    BLOOD RIGTH ARM Performed at [facility], [address] [place], [place] [address]    Special Requests   Final    BOTTLES DRAWN AEROBIC AND ANAEROBIC Blood Culture results may not be optimal due to an inadequate volume of blood received in culture bottles Performed at [facility], [address] [place], [place] [address]    Culture  Setup Time   Final    Organism ID to follow GRAM NEGATIVE RODS IN BOTH AEROBIC AND ANAEROBIC BOTTLES CRITICAL RESULT CALLED TO, READ BACK BY AND VERIFIED  WITH: NATHAN BELUE PHARMD @0039  02/08/23 ASW    Culture (A)  Final    ESCHERICHIA COLI SUSCEPTIBILITIES TO FOLLOW Performed at [facility], [address] [place], [place] [address]    Report Status PENDING  Incomplete  Blood culture (routine x 2)     Status: None (Preliminary result)   Collection Time: 02/07/23  1:20 PM   Specimen: BLOOD  Result Value Ref Range Status   Specimen Description   Final    BLOOD LEFT ARM Performed at [facility], [address] [place], [place] [address]    Special Requests   Final    BOTTLES DRAWN AEROBIC AND ANAEROBIC Blood Culture results may not be optimal due to an inadequate volume of blood received in culture bottles Performed at [facility], [address] [place], [place] [address]    Culture  Setup Time   Final    GRAM NEGATIVE RODS IN BOTH  AEROBIC AND ANAEROBIC BOTTLES CRITICAL VALUE NOTED.  VALUE IS CONSISTENT WITH PREVIOUSLY REPORTED AND CALLED VALUE. Performed at [facility], [address] [place], [place] [address]    Culture   Final    Romie Minus NEGATIVE RODS IDENTIFICATION TO FOLLOW Performed at [facility], [address], [place], [place] [address]    Report Status PENDING  Incomplete  Blood Culture ID Panel (Reflexed)     Status: Abnormal   Collection Time: 02/07/23  1:20 PM  Result Value Ref Range Status   Enterococcus faecalis NOT DETECTED NOT DETECTED Final   Enterococcus Faecium NOT DETECTED NOT DETECTED Final   Listeria monocytogenes NOT DETECTED NOT DETECTED Final   Staphylococcus species NOT DETECTED NOT DETECTED Final   Staphylococcus aureus (BCID) NOT DETECTED NOT DETECTED Final   Staphylococcus epidermidis NOT DETECTED NOT DETECTED Final   Staphylococcus lugdunensis NOT DETECTED NOT DETECTED Final   Streptococcus species NOT DETECTED NOT DETECTED Final   Streptococcus agalactiae NOT DETECTED NOT DETECTED Final   Streptococcus pneumoniae NOT DETECTED NOT DETECTED  Final   Streptococcus pyogenes NOT DETECTED NOT DETECTED Final   A.calcoaceticus-baumannii NOT DETECTED NOT DETECTED Final   Bacteroides fragilis NOT DETECTED NOT DETECTED Final   Enterobacterales DETECTED (A) NOT DETECTED Final    Comment: Enterobacterales represent a large order of gram negative bacteria, not a single organism. CRITICAL RESULT CALLED TO, READ BACK BY AND VERIFIED WITH: NATHAN BELUE PHARMD @0039  02/08/23 ASW    Enterobacter cloacae complex NOT DETECTED NOT DETECTED Final   Escherichia coli DETECTED (A) NOT DETECTED Final    Comment: CRITICAL RESULT CALLED TO, READ BACK BY AND VERIFIED WITH: NATHAN BELUE PHARMD @0039  02/08/23 ASW    Klebsiella aerogenes NOT DETECTED NOT DETECTED Final   Klebsiella oxytoca NOT DETECTED NOT DETECTED Final   Klebsiella pneumoniae NOT DETECTED NOT DETECTED Final   Proteus species NOT DETECTED NOT DETECTED Final   Salmonella species NOT DETECTED NOT DETECTED Final   Serratia marcescens NOT DETECTED NOT DETECTED Final   Haemophilus influenzae NOT DETECTED NOT DETECTED Final   Neisseria meningitidis NOT DETECTED NOT DETECTED Final   Pseudomonas aeruginosa NOT DETECTED NOT DETECTED Final   Stenotrophomonas maltophilia NOT DETECTED NOT DETECTED Final   Candida albicans NOT DETECTED NOT DETECTED Final   Candida auris NOT DETECTED NOT DETECTED Final   Candida glabrata NOT DETECTED NOT DETECTED Final   Candida krusei NOT DETECTED NOT DETECTED Final   Candida parapsilosis NOT DETECTED NOT DETECTED Final   Candida tropicalis NOT DETECTED NOT DETECTED Final   Cryptococcus neoformans/gattii NOT DETECTED NOT DETECTED Final   CTX-M ESBL NOT DETECTED NOT DETECTED Final   Carbapenem resistance IMP NOT DETECTED NOT DETECTED Final   Carbapenem resistance KPC NOT DETECTED NOT DETECTED Final   Carbapenem resistance NDM NOT DETECTED NOT DETECTED Final   Carbapenem resist OXA 48 LIKE NOT DETECTED NOT DETECTED Final   Carbapenem resistance VIM NOT DETECTED  NOT DETECTED Final    Comment: Performed at [facility], [address], [place], [place] [address]  Urine Culture     Status: Abnormal (Preliminary result)   Collection Time: 02/07/23  1:21 PM   Specimen: Urine, Catheterized  Result Value Ref Range Status   Specimen Description   Final    URINE, CATHETERIZED Performed at [facility], [address] [place], [place] [address]    Special Requests   Final    NONE Reflexed from [id number] Performed at [facility], [address] [place], [place] [address]    Culture >=100,000 COLONIES/mL ESCHERICHIA COLI (A)  Final   Report Status PENDING  Incomplete  Surgical pcr screen     Status: None   Collection Time: 02/08/23  3:20 AM   Specimen: Nasal Mucosa; Nasal Swab  Result Value Ref Range Status   MRSA, PCR NEGATIVE NEGATIVE Final   Staphylococcus aureus NEGATIVE NEGATIVE Final    Comment: (NOTE) The Xpert SA Assay (FDA approved for NASAL specimens in patients 31 years of age and older), is one component of a comprehensive surveillance program. It is not intended to diagnose infection nor to guide or monitor treatment. Performed at [facility], [address] [place], [place] [address]          Radiology Studies: DG Chest [place] 1 View Result Date: 02/07/2023 CLINICAL DATA:  Cough, short of breath EXAM: PORTABLE CHEST 1 VIEW COMPARISON:  08/21/2016 FINDINGS: Single frontal view of the chest demonstrates stable enlargement of the cardiac silhouette. Moderate hiatal hernia. There is patchy bilateral airspace disease, most pronounced in the right upper lobe abutting the minor fissure. Atelectatic changes are again seen at the lung bases. No effusion or pneumothorax. Chronic elevation of the right hemidiaphragm. No acute bony abnormalities. IMPRESSION: 1. Patchy bilateral perihilar airspace disease most pronounced in the right upper lobe, compatible with bronchopneumonia. 2. Hiatal hernia.  Electronically Signed   By: Sharlet Salina M.D.   On: 02/07/2023 20:45        Scheduled Meds:  apixaban  2.5 mg Oral BID   Continuous Infusions:  sodium chloride 75 mL/hr at 02/09/23 1055   azithromycin Stopped (02/08/23 2128)   cefTRIAXone (ROCEPHIN)  IV 2 g (02/09/23 1313)     LOS: 2 days       Charise Killian, MD [facility] Pager [phone]  If 7PM-7AM, please contact night-coverage [email or web address] 02/09/2023, 3:00 PM

## 2023-02-09 NOTE — Plan of Care (Signed)

## 2023-02-10 DIAGNOSIS — A419 Sepsis, unspecified organism: Secondary | ICD-10-CM | POA: Diagnosis not present

## 2023-02-10 DIAGNOSIS — R6520 Severe sepsis without septic shock: Secondary | ICD-10-CM | POA: Diagnosis not present

## 2023-02-10 LAB — CBC
HCT: 37.7 % (ref 36.0–46.0)
Hemoglobin: 13 g/dL (ref 12.0–15.0)
MCH: 31.3 pg (ref 26.0–34.0)
MCHC: 34.5 g/dL (ref 30.0–36.0)
MCV: 90.6 fL (ref 80.0–100.0)
Platelets: 146 10*3/uL — ABNORMAL LOW (ref 150–400)
RBC: 4.16 MIL/uL (ref 3.87–5.11)
RDW: 15.9 % — ABNORMAL HIGH (ref 11.5–15.5)
WBC: 11.3 10*3/uL — ABNORMAL HIGH (ref 4.0–10.5)
nRBC: 0 % (ref 0.0–0.2)

## 2023-02-10 LAB — COMPREHENSIVE METABOLIC PANEL
ALT: 9 U/L (ref 0–44)
AST: 16 U/L (ref 15–41)
Albumin: 2.3 g/dL — ABNORMAL LOW (ref 3.5–5.0)
Alkaline Phosphatase: 103 U/L (ref 38–126)
Anion gap: 7 (ref 5–15)
BUN: 51 mg/dL — ABNORMAL HIGH (ref 8–23)
CO2: 20 mmol/L — ABNORMAL LOW (ref 22–32)
Calcium: 8.5 mg/dL — ABNORMAL LOW (ref 8.9–10.3)
Chloride: 102 mmol/L (ref 98–111)
Creatinine, Ser: 0.95 mg/dL (ref 0.44–1.00)
GFR, Estimated: 55 mL/min — ABNORMAL LOW (ref >60)
Glucose, Bld: 76 mg/dL (ref 70–99)
Potassium: 3.3 mmol/L — ABNORMAL LOW (ref 3.5–5.1)
Sodium: 129 mmol/L — ABNORMAL LOW (ref 135–145)
Total Bilirubin: 1 mg/dL (ref <1.2)
Total Protein: 6 g/dL — ABNORMAL LOW (ref 6.5–8.1)

## 2023-02-10 LAB — CULTURE, BLOOD (ROUTINE X 2)

## 2023-02-10 LAB — URINE CULTURE: Culture: >=100000 — AB

## 2023-02-10 MED ORDER — NYSTATIN 100000 UNIT/ML MT SUSP
5.0000 mL | Freq: Four times a day (QID) | OROMUCOSAL | Status: DC
  Administered 2023-02-10 – 2023-02-15 (×20): 500000 [IU] via ORAL
  Filled 2023-02-10 (×20): qty 5

## 2023-02-10 MED ORDER — POTASSIUM CHLORIDE 20 MEQ PO PACK
40.0000 meq | PACK | Freq: Once | ORAL | Status: AC
  Administered 2023-02-10: 40 meq via ORAL
  Filled 2023-02-10: qty 2

## 2023-02-10 MED ORDER — APIXABAN 5 MG PO TABS
5.0000 mg | ORAL_TABLET | Freq: Two times a day (BID) | ORAL | Status: DC
Start: 2023-02-10 — End: 2023-02-15
  Administered 2023-02-10 – 2023-02-15 (×10): 5 mg via ORAL
  Filled 2023-02-10 (×10): qty 1

## 2023-02-10 NOTE — Evaluation (Signed)
Physical Therapy Evaluation Patient Details Name: Kathy Lowery MRN: [id number] DOB: [birth year] Today's Date: 02/10/2023  History of Present Illness  Pt is a 89 yo F with afib and atrial flutter, on chronic anticoagulation with Eliquis, history of intertrigo, hyperlipidemia, HTN, depression, anxiety, GERD, who presents to the ED from [facility] via EMS for C/C of AMS. Current MD assessment includes UTI, bacteremia, acute resp failure, AKI, hyponatremia, elevated troponin, metabolic acidosis, and encephalopathy.  Clinical Impression  Pt was pleasant despite exhibiting questionable cognition/awareness during session. Pt was seemingly oriented to name and month, reporting Thanksgiving as upcoming holiday and making other questionable comments. Pt performed bed mobility slowly with Mod A for BLE and trunk assist. Pt exhibited fair sitting balance, able to static sit without assist for ~3 mins. However, pt refused further mobility 2/2 reporting comfort in bed despite encouragement. Pt's vitals were monitored during session and remained WNL on 1.5L O2, SpO2 = 90% with mobility, slowly increasing with rest. Pt will benefit from continued PT services upon discharge to safely address deficits listed in patient problem list for decreased caregiver assistance and eventual return to PLOF.       If plan is discharge home, recommend the following: A lot of help with walking and/or transfers;A lot of help with bathing/dressing/bathroom;Assistance with cooking/housework;Direct supervision/assist for medications management;Direct supervision/assist for financial management;Assist for transportation;Help with stairs or ramp for entrance;Other (comment) (home = prior facility location)   Can travel by private vehicle   No    Equipment Recommendations Other (comment) (TBD at next venue of care)  Recommendations for Other Services       Functional Status Assessment Patient has had a recent decline in their  functional status and demonstrates the ability to make significant improvements in function in a reasonable and predictable amount of time.     Precautions / Restrictions Precautions Precautions: Fall Restrictions Weight Bearing Restrictions Per Provider Order: No      Mobility  Bed Mobility Overal bed mobility: Needs Assistance Bed Mobility: Supine to Sit, Sit to Supine     Supine to sit: Mod assist, HOB elevated, Used rails Sit to supine: Mod assist   General bed mobility comments: resistant to complete 2/2 reported comfort in bed - requiring encouragement, completing slowly and struggling while advancing limbs in small increments, needing light trunk and BLE assist, eventually able to come to sitting with help to adjust EOB    Transfers                   General transfer comment: refusing to attempt 2/2 being comfortable in bed    Ambulation/Gait                  Stairs            Wheelchair Mobility     Tilt Bed    Modified Rankin (Stroke Patients Only)       Balance Overall balance assessment: Needs assistance Sitting-balance support: Bilateral upper extremity supported, Feet supported Sitting balance-Leahy Scale: Fair Sitting balance - Comments: able to static sit EOB without assist for ~3 mins       Standing balance comment: n/a                             Pertinent Vitals/Pain Pain Assessment Pain Assessment: PAINAD (reported low back pain) Breathing: normal Negative Vocalization: occasional moan/groan, low speech, negative/disapproving quality Facial Expression: sad, frightened, frown Body Language: relaxed  Consolability: no need to console PAINAD Score: 2 Pain Intervention(s): Monitored during session, Repositioned    Home Living Family/patient expects to be discharged to:: Other (Comment) ([facility] independent living apartment)                   Additional Comments: Anticipate no stairs to enter or  inside home. Anticipate accessible bathroom, reported having "1 kitchen"    Prior Function Prior Level of Function : Patient poor historian/Family not available             Mobility Comments: Unknown; pt is not reliable historian, but states she mostly uses her w/c and spends time in her recliner; unknown t/f at baseline. ADLs Comments: Unknown prior level     Extremity/Trunk Assessment   Upper Extremity Assessment Upper Extremity Assessment: Generalized weakness    Lower Extremity Assessment Lower Extremity Assessment: Generalized weakness    Cervical / Trunk Assessment Cervical / Trunk Assessment: Kyphotic  Communication   Communication Communication: Difficulty communicating thoughts/reduced clarity of speech (initial slur showing improvement over time) Cueing Techniques: Verbal cues;Gestural cues;Visual cues  Cognition Arousal: Alert Behavior During Therapy: WFL for tasks assessed/performed Overall Cognitive Status: No family/caregiver present to determine baseline cognitive functioning                                 General Comments: Pt is oriented to self; thinks that Thanksgiving is the next holiday, but then states the month is "12". Has difficulty stating prior level of function, but does give some details. Pt demonstrating some difficulty with word-finding and occasionally repeating short phrases        General Comments General comments (skin integrity, edema, etc.): Pt on 1.5L during session; O2 pulseox not reading well, but appearing to be approx 90% saturation. HR in 80's throughout session.    Exercises     Assessment/Plan    PT Assessment Patient needs continued PT services  PT Problem List Decreased strength;Decreased mobility;Decreased range of motion;Decreased activity tolerance;Decreased balance;Decreased knowledge of use of DME;Decreased cognition;Decreased coordination;Decreased safety awareness;Cardiopulmonary status limiting  activity;Pain       PT Treatment Interventions DME instruction;Therapeutic exercise;Balance training;Gait training;Stair training;Functional mobility training;Therapeutic activities;Patient/family education;Cognitive remediation;Wheelchair mobility training;Neuromuscular re-education    PT Goals (Current goals can be found in the Care Plan section)  Acute Rehab PT Goals PT Goal Formulation: Patient unable to participate in goal setting    Frequency Min 1X/week     Co-evaluation PT/OT/SLP Co-Evaluation/Treatment: Yes Reason for Co-Treatment: Necessary to address cognition/behavior during functional activity;To address functional/ADL transfers PT goals addressed during session: Mobility/safety with mobility OT goals addressed during session: ADL's and self-care       AM-PAC PT "6 Clicks" Mobility  Outcome Measure Help needed turning from your back to your side while in a flat bed without using bedrails?: A Little Help needed moving from lying on your back to sitting on the side of a flat bed without using bedrails?: A Lot Help needed moving to and from a bed to a chair (including a wheelchair)?: A Lot Help needed standing up from a chair using your arms (e.g., wheelchair or bedside chair)?: A Little Help needed to walk in hospital room?: A Lot Help needed climbing 3-5 steps with a railing? : Total 6 Click Score: 13    End of Session Equipment Utilized During Treatment: Oxygen (1.5 L) Activity Tolerance: Patient tolerated treatment well;Other (comment) (self-limiting) Patient left: in bed;with  call bell/phone within reach;with bed alarm set Nurse Communication: Mobility status PT Visit Diagnosis: Muscle weakness (generalized) (M62.81);Pain;Difficulty in walking, not elsewhere classified (R26.2) Pain - part of body:  (low back)    Time: 9629-5284 PT Time Calculation (min) (ACUTE ONLY): 23 min   Charges:                Rosiland Oz SPT 02/10/23, 2:14 PM

## 2023-02-10 NOTE — Care Management Important Message (Signed)
Important Message  Patient Details  Name: Kathy Lowery MRN: [id number] Date of Birth: 1924-09-23   Important Message Given:        [name] 02/10/2023, 2:10 PM

## 2023-02-10 NOTE — Evaluation (Signed)
Clinical/Bedside Swallow Evaluation Patient Details  Name: Kathy Lowery MRN: [id number] Date of Birth: April 20, 1924  Today's Date: 02/10/2023 Time: SLP Start Time (ACUTE ONLY): 0850 SLP Stop Time (ACUTE ONLY): 0945 SLP Time Calculation (min) (ACUTE ONLY): 55 min  Past Medical History:  Past Medical History:  Diagnosis Date   Arthritis    CHF (congestive heart failure) (HCC)    Chronic atrial fibrillation (HCC)    Coronary artery disease    Non-ST elevation myocardial infarction in February 2017. Cardiac catheterization showed significant two-vessel coronary artery disease affecting the right coronary artery and left circumflex with no significant disease involving the LAD. Ejection fraction was 30-35% with akinesis of the mid to distal and apical segments consistent with stress-induced cardiomyopathy.   HTN (hypertension)    Hyperlipidemia    MI (myocardial infarction) [facility])    Peripheral neuralgia    Past Surgical History:  Past Surgical History:  Procedure Laterality Date   APPENDECTOMY     CARDIAC CATHETERIZATION N/A 04/23/2015   Procedure: Left Heart Cath and Coronary Angiography;  Surgeon: Iran Ouch, MD;  Location: [facility] INVASIVE CV LAB;  Service: Cardiovascular;  Laterality: N/A;   LUMBAR SPINE SURGERY     HPI:  Pt is a 89 yo F with Cognitive decline, Hiatal Hernia, Quadriplegia , afib and atrial flutter, on chronic anticoagulation with Eliquis, history of intertrigo, hyperlipidemia, HTN, depression, anxiety, GERD, who presents to the ED from [facility] via EMS for C/C of AMS. Current MD assessment includes UTI, bacteremia, acute resp failure, AKI, hyponatremia, elevated troponin, metabolic acidosis, and encephalopathy. Decreased appetite in last few days reported.  Pt denied difficulty swallowing; no issues eating her meals at TL.  CT of Chest: There are patchy atelectatic changes in the visualized  lung bases. No overt consolidation. No pleural effusion. CXR: Patchy bilateral  perihilar airspace disease most pronounced in  the right upper lobe, compatible with bronchopneumonia.  2. Hiatal hernia.  No other Chest Imaging since 2018 per chart.    Assessment / Plan / Recommendation  Clinical Impression   Pt seen for BSE. Pt awake, min HOH. Distracted easily as she attempted to setup her breakfast tray and feed self. Slow speech. But overall oriented to self/hospital/children and followed basic instructions. Pt has Baseline Cognitive decline per chart; and a Hiatal Hernia per Imaging.  On [place] O2 support of 1.5L; afebrile.   Pt appears to present w/ functional oropharyngeal phase swallowing w/ No oropharyngeal phase dysphagia appreciated, No neuromuscular deficits noted. Pt consumed po's w/ No overt, clinical s/s of aspiration during po trials.  Pt appears at reduced risk for aspiration from an oropharyngeal phase standpoint when following general aspiration precautions w/ an easy to eat(soft foods) diet. However, pt does have challenging factors that could impact her oropharyngeal swallowing to include Baseline Cognitive decline, Hiatal Hernia impacting Esophageal phase motility and result in Regurgitation of REFLUX material into the airway, deconditioning/weakness, and advanced age as well as need for tray setup and positioning support to eat meals. These factors can increase risk for aspiration, dysphagia as well as decreased oral intake overall.   During po trials, pt consumed all consistencies w/ no overt coughing, decline in vocal quality, or change in respiratory presentation during/post trials. O2 sats remained 98-99%. Oral phase appeared [facility] w/ timely bolus management, mastication, and control of bolus propulsion for A-P transfer for swallowing. Oral clearing achieved w/ all trial consistencies -- moistened, soft foods given.  OM Exam appeared [facility] w/ no unilateral weakness noted.  Speech Clear. Pt fed self w/ setup support.   Recommend a fairly Regular consistency diet w/  well-Cut meats, moistened foods; Thin liquids -- carefully monitor positioning Upright for any oral intake. Reduce distractions at meals. Support tray setup. Recommend general aspiration precautions. REFLUX Precautions d/t Hiatal Hernia. Pills WHOLE in Puree for safer, easier swallowing if any issues w/ swallowing w/ Water.  Education given on Pills in Puree; food consistencies and easy to eat options; general aspiration and REFLUX precautions to pt and NSG. Precautions posted in room, chart. MD/NSG to reconsult if any new needs arise during admit. MD updated. Recommend Dietician f/u for support. SLP Visit Diagnosis: Dysphagia, unspecified (R13.10) (baseline Cognitivqe decline; Hiatal Hernia)    Aspiration Risk   (reduced following general aspiration and REFLUX precs.)    Diet Recommendation   Thin;Age appropriate regular (CUT meats/foods, moistened foods for ease of intake) = a fairly Regular consistency diet w/ well-Cut meats, moistened foods; Thin liquids -- carefully monitor positioning Upright for any oral intake. Reduce distractions at meals. Support tray setup. Recommend general aspiration precautions. REFLUX Precautions d/t Hiatal Hernia.   Medication Administration: Whole meds with puree (for ease, safety of swallowing as needed)    Other  Recommendations Recommended Consults:  (Dietician support; GI education on Hiatal Hernia) Oral Care Recommendations: Oral care BID;Patient independent with oral care (support)    Recommendations for follow up therapy are one component of a multi-disciplinary discharge planning process, led by the attending physician.  Recommendations may be updated based on patient status, additional functional criteria and insurance authorization.  Follow up Recommendations No SLP follow up      Assistance Recommended at Discharge  Intermittent  Functional Status Assessment Patient has not had a recent decline in their functional status  Frequency and Duration   (n/a)   (n/a)       Prognosis Prognosis for improved oropharyngeal function: Fair (-Good) Barriers to Reach Goals: Cognitive deficits;Time post onset;Severity of deficits Barriers/Prognosis Comment: baseline Cognitive decline and HIatal Hernia      Swallow Study   General Date of Onset: 02/07/23 HPI: Pt is a 89 yo F with Cognitive decline, Hiatal Hernia, afib and atrial flutter, on chronic anticoagulation with Eliquis, history of intertrigo, hyperlipidemia, HTN, depression, anxiety, GERD, who presents to the ED from [facility] via EMS for C/C of AMS. Current MD assessment includes UTI, bacteremia, acute resp failure, AKI, hyponatremia, elevated troponin, metabolic acidosis, and encephalopathy. Decreased appetite in last few days reported.  Pt denied difficulty swallowing; no issues eating her meals at TL.  CT of Chest: There are patchy atelectatic changes in the visualized  lung bases. No overt consolidation. No pleural effusion. CXR: Patchy bilateral perihilar airspace disease most pronounced in  the right upper lobe, compatible with bronchopneumonia.  2. Hiatal hernia.  No other Chest Imaging since 2018 per chart. Type of Study: Bedside Swallow Evaluation Previous Swallow Assessment: none Diet Prior to this Study: Regular;Thin liquids (Level 0) Temperature Spikes Noted: No (wbc trending down) Respiratory Status: Nasal cannula (1.5 from 2L) History of Recent Intubation: No Behavior/Cognition: Alert;Cooperative;Pleasant mood;Confused;Distractible;Requires cueing (baseline cognitive decline) Oral Cavity Assessment: Within Functional Limits Oral Care Completed by SLP: Yes Oral Cavity - Dentition: Adequate natural dentition Vision: Functional for self-feeding Self-Feeding Abilities: Able to feed self;Needs assist;Needs set up Patient Positioning: Upright in bed (needed min support) Baseline Vocal Quality: Normal Volitional Cough: Strong Volitional Swallow: Able to elicit     Oral/Motor/Sensory Function Overall Oral Motor/Sensory Function: Within functional limits  Ice Chips Ice chips: Within functional limits Presentation: Spoon (fed; 2 trials)   Thin Liquid Thin Liquid: Within functional limits Presentation: Cup;Self Fed;Straw (~4-5 ozs total) Other Comments: water, milk    Nectar Thick Nectar Thick Liquid: Not tested   Honey Thick Honey Thick Liquid: Not tested   Puree Puree: Within functional limits Presentation: Self Fed;Spoon (10 trials)   Solid     Solid: Within functional limits Presentation: Self Fed;Spoon (10 trials)        Jerilynn Som, MS, CCC-SLP Speech Language Pathologist Rehab Services; [facility] [phone] (ascom) Jerre Vandrunen 02/10/2023,5:38 PM

## 2023-02-10 NOTE — Care Management Important Message (Signed)
Important Message  Patient Details  Name: Kathy Lowery MRN: [id number] Date of Birth: 06-23-1924   Important Message Given:  Yes - Medicare IM     Olegario Messier A [name] 02/10/2023, 2:10 PM

## 2023-02-10 NOTE — Progress Notes (Signed)
PROGRESS NOTE    Kathy Lowery  ZOX:[id number] DOB: [birth year] DOA: 02/07/2023 PCP: Earnestine Mealing, MD    Assessment & Plan:   Principal Problem:   Severe sepsis with acute organ dysfunction (HCC) Active Problems:   AKI (acute kidney injury) (HCC)   Acute hypoxic respiratory failure (HCC)   Essential hypertension   Coronary artery disease   Atrial fibrillation and flutter (HCC)   Hyperlipidemia, mixed   Quadriplegia, functional (HCC)   PAD (peripheral artery disease) (HCC)   Mild cognitive impairment   Altered mental state   Hard of hearing   Metabolic acidosis   Elevated troponin  Assessment and Plan: Severe sepsis: see Dr. Renea Ee notes on how pt met severe sepsis criteria. Possibly secondary to aspiration pneumonia & bacteremia. Continue on IV rocephin, azithromycin, bronchodilators & encourage incentive spirometry. Sepsis resolved   Likely aspiration pneumonia: continue on IV rocephin, azithromycin, bronchodilators. Speech consulted as concern for aspiration w/ eating and drinking. Encourage incentive spirometry   UTI: urine cx is growing e. coli. Continue on IV rocephin   Bacteremia: blood cultures growing from e. coli. Likely secondary to UTI. Repeat blood cxs ordered. Continue on IV rocephin    Acute hypoxic respiratory failure: likely secondary to pneumonia. Continue on IV abxs. Continue on supplemental oxygen and wean as tolerated    AKI: resolved   Hyponatremia: etiology unclear, ? SIADH. Labile. Will continue to monitor   HTN: holding home dose of aldactone, lasix, propranolol as BP is on end of normal    Elevated troponin: likely secondary to demand ischemia   Metabolic acidosis: continue on sodium bicarb     Hard of hearing: chronic. Continue on supportive care   Encephalopathy: likely secondary to above infections, pneumonia & bacteremia. Continue on IV abxs    Quadriplegia: functional. Continue w/ supportive care    Likely PAF: continue on eliquis.  Holding home dose of propranolol    Obesity: BMI 30.9. Would benefit from weight loss      DVT prophylaxis: eliquis  Code Status: DNR Family Communication: called pt's daugther, [name], and answered her questions  Disposition Plan: PT/OT recs SNF   Level of care: Progressive Status is: Inpatient Remains inpatient appropriate because: severity of illness    Consultants:    Procedures:   Antimicrobials: rocephin, azithromycin    Subjective: Pt c/o fatigue and wants to go home   Objective: Vitals:   02/09/23 2054 02/09/23 2338 02/10/23 0329 02/10/23 0844  BP: 127/60 126/72 (!) 126/54 (!) 122/57  Pulse: 62 62 (!) 110 89  Resp: 16   15  Temp: (!) 97.5 F (36.4 C) 97.6 F (36.4 C) 97.6 F (36.4 C) 97.8 F (36.6 C)  TempSrc: Oral     SpO2: 95% 92% 99% (!) 89%  Weight:        Intake/Output Summary (Last 24 hours) at 02/10/2023 0845 Last data filed at 02/10/2023 0400 Gross per 24 hour  Intake 1959.16 ml  Output 300 ml  Net 1659.16 ml   Filed Weights   02/09/23 1313  Weight: 80.4 kg    Examination:  General exam: appears comfortable  Respiratory system: decreased breath sounds b/l  Cardiovascular system: S1 & S2+.  Gastrointestinal system: abd is soft, NT, ND & hypoactive bowel sounds Central nervous system: alert & awake  Psychiatry: Judgement and insight appears poor. Flat mood and affect    Data Reviewed: I have personally reviewed following labs and imaging studies  CBC: Recent Labs  Lab 02/07/23 1320 02/08/23 0320 02/10/23  0640  WBC 19.0* 17.4* 11.3*  NEUTROABS 16.0*  --   --   HGB 14.2 12.5 13.0  HCT 41.0 36.7 37.7  MCV 88.7 92.0 90.6  PLT 145* 118* 146*   Basic Metabolic Panel: Recent Labs  Lab 02/07/23 1320 02/08/23 0320 02/10/23 0640  NA 129* 128* 129*  K 5.0 3.5 3.3*  CL 93* 99 102  CO2 20* 19* 20*  GLUCOSE 103* 117* 76  BUN 95* 74* 51*  CREATININE 1.90* 1.44* 0.95  CALCIUM 9.1 8.2* 8.5*   GFR: Estimated Creatinine  Clearance: 35.2 mL/min (by C-G formula based on SCr of 0.95 mg/dL). Liver Function Tests: Recent Labs  Lab 02/07/23 2148 02/08/23 0320 02/10/23 0640  AST 19 20 16   ALT 10 11 9   ALKPHOS 97 96 103  BILITOT 1.0 1.5* 1.0  PROT 5.9* 6.0* 6.0*  ALBUMIN 2.4* 2.4* 2.3*   No results for input(s): "LIPASE", "AMYLASE" in the last 168 hours. No results for input(s): "AMMONIA" in the last 168 hours. Coagulation Profile: No results for input(s): "INR", "PROTIME" in the last 168 hours. Cardiac Enzymes: No results for input(s): "CKTOTAL", "CKMB", "CKMBINDEX", "TROPONINI" in the last 168 hours. BNP (last 3 results) No results for input(s): "PROBNP" in the last 8760 hours. HbA1C: No results for input(s): "HGBA1C" in the last 72 hours. CBG: No results for input(s): "GLUCAP" in the last 168 hours. Lipid Profile: No results for input(s): "CHOL", "HDL", "LDLCALC", "TRIG", "CHOLHDL", "LDLDIRECT" in the last 72 hours. Thyroid Function Tests: No results for input(s): "TSH", "T4TOTAL", "FREET4", "T3FREE", "THYROIDAB" in the last 72 hours. Anemia Panel: No results for input(s): "VITAMINB12", "FOLATE", "FERRITIN", "TIBC", "IRON", "RETICCTPCT" in the last 72 hours. Sepsis Labs: Recent Labs  Lab 02/07/23 1320 02/07/23 2148 02/08/23 0320  PROCALCITON 27.93  --  14.43  LATICACIDVEN 1.6 0.9  --     Recent Results (from the past 240 hours)  Blood culture (routine x 2)     Status: Abnormal   Collection Time: 02/07/23  1:20 PM   Specimen: BLOOD  Result Value Ref Range Status   Specimen Description   Final    BLOOD RIGTH ARM Performed at [facility], [address] [place], [place] [address]    Special Requests   Final    BOTTLES DRAWN AEROBIC AND ANAEROBIC Blood Culture results may not be optimal due to an inadequate volume of blood received in culture bottles Performed at [facility], [address] [place], [place] [address]    Culture  Setup Time   Final    Organism ID  to follow GRAM NEGATIVE RODS IN BOTH AEROBIC AND ANAEROBIC BOTTLES CRITICAL RESULT CALLED TO, READ BACK BY AND VERIFIED WITH: NATHAN BELUE PHARMD @0039  02/08/23 ASW    Culture ESCHERICHIA COLI (A)  Final   Report Status 02/10/2023 FINAL  Final   Organism ID, Bacteria ESCHERICHIA COLI  Final   Organism ID, Bacteria ESCHERICHIA COLI  Final      Susceptibility   Escherichia coli - KIRBY BAUER*    CEFAZOLIN SENSITIVE Sensitive    Escherichia coli - MIC*    AMPICILLIN >=32 RESISTANT Resistant     CEFEPIME <=0.12 SENSITIVE Sensitive     CEFTAZIDIME <=1 SENSITIVE Sensitive     CEFTRIAXONE <=0.25 SENSITIVE Sensitive     CIPROFLOXACIN <=0.25 SENSITIVE Sensitive     GENTAMICIN <=1 SENSITIVE Sensitive     IMIPENEM <=0.25 SENSITIVE Sensitive     TRIMETH/SULFA <=20 SENSITIVE Sensitive     AMPICILLIN/SULBACTAM 16 INTERMEDIATE  Intermediate     PIP/TAZO <=4 SENSITIVE Sensitive ug/mL    * ESCHERICHIA COLI    ESCHERICHIA COLI  Blood culture (routine x 2)     Status: Abnormal   Collection Time: 02/07/23  1:20 PM   Specimen: BLOOD  Result Value Ref Range Status   Specimen Description   Final    BLOOD LEFT ARM Performed at [facility], [address] [place], [place] [address]    Special Requests   Final    BOTTLES DRAWN AEROBIC AND ANAEROBIC Blood Culture results may not be optimal due to an inadequate volume of blood received in culture bottles Performed at [facility], [address] [place], [place] [address]    Culture  Setup Time   Final    GRAM NEGATIVE RODS IN BOTH AEROBIC AND ANAEROBIC BOTTLES CRITICAL VALUE NOTED.  VALUE IS CONSISTENT WITH PREVIOUSLY REPORTED AND CALLED VALUE. Performed at [facility], [address] [place], [place] [address]    Culture (A)  Final    ESCHERICHIA COLI SUSCEPTIBILITIES PERFORMED ON PREVIOUS CULTURE WITHIN THE LAST 5 DAYS. Performed at [facility], [address], [place], [place] [address]    Report  Status 02/10/2023 FINAL  Final  Blood Culture ID Panel (Reflexed)     Status: Abnormal   Collection Time: 02/07/23  1:20 PM  Result Value Ref Range Status   Enterococcus faecalis NOT DETECTED NOT DETECTED Final   Enterococcus Faecium NOT DETECTED NOT DETECTED Final   Listeria monocytogenes NOT DETECTED NOT DETECTED Final   Staphylococcus species NOT DETECTED NOT DETECTED Final   Staphylococcus aureus (BCID) NOT DETECTED NOT DETECTED Final   Staphylococcus epidermidis NOT DETECTED NOT DETECTED Final   Staphylococcus lugdunensis NOT DETECTED NOT DETECTED Final   Streptococcus species NOT DETECTED NOT DETECTED Final   Streptococcus agalactiae NOT DETECTED NOT DETECTED Final   Streptococcus pneumoniae NOT DETECTED NOT DETECTED Final   Streptococcus pyogenes NOT DETECTED NOT DETECTED Final   A.calcoaceticus-baumannii NOT DETECTED NOT DETECTED Final   Bacteroides fragilis NOT DETECTED NOT DETECTED Final   Enterobacterales DETECTED (A) NOT DETECTED Final    Comment: Enterobacterales represent a large order of gram negative bacteria, not a single organism. CRITICAL RESULT CALLED TO, READ BACK BY AND VERIFIED WITH: NATHAN BELUE PHARMD @0039  02/08/23 ASW    Enterobacter cloacae complex NOT DETECTED NOT DETECTED Final   Escherichia coli DETECTED (A) NOT DETECTED Final    Comment: CRITICAL RESULT CALLED TO, READ BACK BY AND VERIFIED WITH: NATHAN BELUE PHARMD @0039  02/08/23 ASW    Klebsiella aerogenes NOT DETECTED NOT DETECTED Final   Klebsiella oxytoca NOT DETECTED NOT DETECTED Final   Klebsiella pneumoniae NOT DETECTED NOT DETECTED Final   Proteus species NOT DETECTED NOT DETECTED Final   Salmonella species NOT DETECTED NOT DETECTED Final   Serratia marcescens NOT DETECTED NOT DETECTED Final   Haemophilus influenzae NOT DETECTED NOT DETECTED Final   Neisseria meningitidis NOT DETECTED NOT DETECTED Final   Pseudomonas aeruginosa NOT DETECTED NOT DETECTED Final   Stenotrophomonas maltophilia  NOT DETECTED NOT DETECTED Final   Candida albicans NOT DETECTED NOT DETECTED Final   Candida auris NOT DETECTED NOT DETECTED Final   Candida glabrata NOT DETECTED NOT DETECTED Final   Candida krusei NOT DETECTED NOT DETECTED Final   Candida parapsilosis NOT DETECTED NOT DETECTED Final   Candida tropicalis NOT DETECTED NOT DETECTED Final   Cryptococcus neoformans/gattii NOT DETECTED NOT DETECTED Final   CTX-M ESBL NOT DETECTED NOT  DETECTED Final   Carbapenem resistance IMP NOT DETECTED NOT DETECTED Final   Carbapenem resistance KPC NOT DETECTED NOT DETECTED Final   Carbapenem resistance NDM NOT DETECTED NOT DETECTED Final   Carbapenem resist OXA 48 LIKE NOT DETECTED NOT DETECTED Final   Carbapenem resistance VIM NOT DETECTED NOT DETECTED Final    Comment: Performed at [facility], [address], [place], [place] [address]  Urine Culture     Status: Abnormal (Preliminary result)   Collection Time: 02/07/23  1:21 PM   Specimen: Urine, Catheterized  Result Value Ref Range Status   Specimen Description   Final    URINE, CATHETERIZED Performed at [facility], [address], [place], [place] [address]    Special Requests   Final    NONE Reflexed from [id number] Performed at [facility], [address] [place], [place] [address]    Culture >=100,000 COLONIES/mL ESCHERICHIA COLI (A)  Final   Report Status PENDING  Incomplete  Surgical pcr screen     Status: None   Collection Time: 02/08/23  3:20 AM   Specimen: Nasal Mucosa; Nasal Swab  Result Value Ref Range Status   MRSA, PCR NEGATIVE NEGATIVE Final   Staphylococcus aureus NEGATIVE NEGATIVE Final    Comment: (NOTE) The Xpert SA Assay (FDA approved for NASAL specimens in patients 56 years of age and older), is one component of a comprehensive surveillance program. It is not intended to diagnose infection nor to guide or monitor treatment. Performed at [facility], [address],  [place], [place] [address]          Radiology Studies: No results found.       Scheduled Meds:  apixaban  2.5 mg Oral BID   azithromycin  500 mg Oral QHS   sodium bicarbonate  650 mg Oral BID   Continuous Infusions:  cefTRIAXone (ROCEPHIN)  IV Stopped (02/09/23 1343)     LOS: 3 days       Charise Killian, MD [facility] Pager [phone]  If 7PM-7AM, please contact night-coverage [email or web address] 02/10/2023, 8:45 AM

## 2023-02-10 NOTE — Evaluation (Signed)
Occupational Therapy Evaluation Patient Details Name: Kathy Lowery MRN: [id number] DOB: 04-Aug-1924 Today's Date: 02/10/2023   History of Present Illness Pt is a 89 yo F with afib and atrial flutter, on chronic anticoagulation with Eliquis, history of intertrigo, hyperlipidemia, HTN, depression, anxiety, GERD, who presents to the ED from [facility] via EMS for C/C of AMS. Current MD assessment includes UTI, bacteremia, acute resp failure, AKI, hyponatremia, elevated troponin, metabolic acidosis, and encephalopathy.   Clinical Impression   Patient received for OT evaluation. See flowsheet below for details of function. Generally, patient requiring MOD A for bed mobility, unwilling to attempt functional mobility, and MAX A for ADLs. Patient will benefit from continued OT while in acute care.       If plan is discharge home, recommend the following: Two people to help with walking and/or transfers;A lot of help with bathing/dressing/bathroom;Assistance with cooking/housework;Assistance with feeding;Direct supervision/assist for medications management;Direct supervision/assist for financial management;Assist for transportation;Help with stairs or ramp for entrance;Supervision due to cognitive status    Functional Status Assessment  Patient has had a recent decline in their functional status and demonstrates the ability to make significant improvements in function in a reasonable and predictable amount of time.  Equipment Recommendations  Other (comment) (defer to next venue of care)    Recommendations for Other Services       Precautions / Restrictions Precautions Precautions: Fall Restrictions Weight Bearing Restrictions Per Provider Order: No      Mobility Bed Mobility Overal bed mobility: Needs Assistance Bed Mobility: Supine to Sit, Sit to Supine     Supine to sit: Mod assist Sit to supine: Mod assist   General bed mobility comments: Pt appearing to not be giving full effort,  as she states she'd like to stay in bed. Attempting to get pt to scoot at EOB, but she just moved her trunk to lay back in bed.    Transfers                   General transfer comment: Unable to attempt t/f today 2/2 pt refusing.      Balance Overall balance assessment: Mild deficits observed, not formally tested                                         ADL either performed or assessed with clinical judgement   ADL Overall ADL's : Needs assistance/impaired Eating/Feeding: Set up;Bed level Eating/Feeding Details (indicate cue type and reason): pt set up and falling asleep with hand on spoon of applesauce when OT arrived, but appeared to have eaten some of her chopped breakfast                                   General ADL Comments: Anticipate MAX A for all LB ADLs bed level at this time. Pt only tolerated approx 3 minutes at EOB and requesting to return to bed; not able to demonstrate standing or transfer today. Anticipate MIN-MOD A UB ADL bed level or EOB.     Vision Baseline Vision/History:  (unknown prior level; pt does have what appear to be reading glasses on the top of her head throughout session.)       Perception         Praxis         Pertinent Vitals/Pain Pain  Assessment Pain Assessment: PAINAD (pt stating she has low back pain) Breathing: normal Negative Vocalization: occasional moan/groan, low speech, negative/disapproving quality Facial Expression: sad, frightened, frown Body Language: relaxed Consolability: no need to console PAINAD Score: 2     Extremity/Trunk Assessment Upper Extremity Assessment Upper Extremity Assessment: Generalized weakness   Lower Extremity Assessment Lower Extremity Assessment: Generalized weakness   Cervical / Trunk Assessment Cervical / Trunk Assessment: Kyphotic   Communication Communication Communication: Difficulty communicating thoughts/reduced clarity of speech (initial slur  showing improvement over time) Cueing Techniques: Verbal cues;Gestural cues;Visual cues   Cognition Arousal: Alert Behavior During Therapy: WFL for tasks assessed/performed Overall Cognitive Status: No family/caregiver present to determine baseline cognitive functioning                                 General Comments: Pt is oriented to self; thinks that Thanksgiving is the next holiday, but then states the month is "12". Has difficulty stating prior level of function, but does give some details. Pt demonstrating some word-finding difficulty during session.     General Comments  Pt on 1.5L during session; O2 pulseox not reading well, but appearing to be approx 90% saturation. HR in 80's throughout session.    Exercises     Shoulder Instructions      Home Living Family/patient expects to be discharged to:: Other (Comment) ([facility] independent living apartment)                                 Additional Comments: Anticipate no stairs to enter or inside home. Anticipate accessible bathroom, reported having "1 kitchen"      Prior Functioning/Environment Prior Level of Function : Patient poor historian/Family not available             Mobility Comments: Unknown; pt is not reliable historian, but states she mostly uses her w/c and spends time in her recliner; unknown t/f at baseline. ADLs Comments: Unknown prior level        OT Problem List: Decreased range of motion;Decreased activity tolerance;Impaired balance (sitting and/or standing);Decreased cognition;Decreased knowledge of precautions;Decreased safety awareness      OT Treatment/Interventions: Self-care/ADL training;Therapeutic exercise;Cognitive remediation/compensation;Patient/family education;Therapeutic activities    OT Goals(Current goals can be found in the care plan section) Acute Rehab OT Goals Patient Stated Goal: Return to bed OT Goal Formulation: Patient unable to participate in  goal setting Time For Goal Achievement: 02/24/23 Potential to Achieve Goals: Fair ADL Goals Pt Will Perform Grooming: with modified independence;with adaptive equipment;sitting Pt Will Perform Lower Body Dressing: with modified independence;sit to/from stand Pt Will Transfer to Toilet: with modified independence;bedside commode Pt Will Perform Toileting - Clothing Manipulation and hygiene: with modified independence;sit to/from stand  OT Frequency: Min 1X/week    Co-evaluation PT/OT/SLP Co-Evaluation/Treatment: Yes Reason for Co-Treatment: Necessary to address cognition/behavior during functional activity;To address functional/ADL transfers PT goals addressed during session: Mobility/safety with mobility OT goals addressed during session: ADL's and self-care      AM-PAC OT "6 Clicks" Daily Activity     Outcome Measure Help from another person eating meals?: A Little Help from another person taking care of personal grooming?: A Little Help from another person toileting, which includes using toliet, bedpan, or urinal?: Total Help from another person bathing (including washing, rinsing, drying)?: A Lot Help from another person to put on and taking off regular upper  body clothing?: A Little Help from another person to put on and taking off regular lower body clothing?: Total 6 Click Score: 13   End of Session Equipment Utilized During Treatment: Oxygen Nurse Communication: Mobility status  Activity Tolerance: Patient limited by fatigue Patient left: in bed;with call bell/phone within reach;with bed alarm set  OT Visit Diagnosis: Unsteadiness on feet (R26.81);Muscle weakness (generalized) (M62.81);Other symptoms and signs involving cognitive function                Time: 7846-9629 OT Time Calculation (min): 25 min Charges:  OT General Charges $OT Visit: 1 Visit OT Evaluation $OT Eval Moderate Complexity: 1 Mod  Yanuel Tagg Junie Panning, MS, OTR/L  Alvester Morin 02/10/2023, 12:58 PM

## 2023-02-11 DIAGNOSIS — A419 Sepsis, unspecified organism: Secondary | ICD-10-CM | POA: Diagnosis not present

## 2023-02-11 DIAGNOSIS — R6520 Severe sepsis without septic shock: Secondary | ICD-10-CM | POA: Diagnosis not present

## 2023-02-11 LAB — COMPREHENSIVE METABOLIC PANEL
ALT: 13 U/L (ref 0–44)
AST: 15 U/L (ref 15–41)
Albumin: 2.1 g/dL — ABNORMAL LOW (ref 3.5–5.0)
Alkaline Phosphatase: 102 U/L (ref 38–126)
Anion gap: 6 (ref 5–15)
BUN: 42 mg/dL — ABNORMAL HIGH (ref 8–23)
CO2: 20 mmol/L — ABNORMAL LOW (ref 22–32)
Calcium: 8.5 mg/dL — ABNORMAL LOW (ref 8.9–10.3)
Chloride: 108 mmol/L (ref 98–111)
Creatinine, Ser: 0.82 mg/dL (ref 0.44–1.00)
GFR, Estimated: >60 mL/min (ref >60)
Glucose, Bld: 96 mg/dL (ref 70–99)
Potassium: 3.6 mmol/L (ref 3.5–5.1)
Sodium: 134 mmol/L — ABNORMAL LOW (ref 135–145)
Total Bilirubin: 0.7 mg/dL (ref <1.2)
Total Protein: 5.4 g/dL — ABNORMAL LOW (ref 6.5–8.1)

## 2023-02-11 LAB — CBC
HCT: 34.5 % — ABNORMAL LOW (ref 36.0–46.0)
Hemoglobin: 11.8 g/dL — ABNORMAL LOW (ref 12.0–15.0)
MCH: 30.7 pg (ref 26.0–34.0)
MCHC: 34.2 g/dL (ref 30.0–36.0)
MCV: 89.8 fL (ref 80.0–100.0)
Platelets: 169 10*3/uL (ref 150–400)
RBC: 3.84 MIL/uL — ABNORMAL LOW (ref 3.87–5.11)
RDW: 15.9 % — ABNORMAL HIGH (ref 11.5–15.5)
WBC: 10.7 10*3/uL — ABNORMAL HIGH (ref 4.0–10.5)
nRBC: 0 % (ref 0.0–0.2)

## 2023-02-11 NOTE — Progress Notes (Signed)
PROGRESS NOTE   HPI was taken from Dr. Sedalia Muta: Ms. Kathy Lowery is a 89 year old female with atrial fibrillation and flutter, quadriplegia functional, on chronic anticoagulation with Eliquis, history of intertrigo, hyperlipidemia, hypertension, depression, anxiety, GERD, who presents to the emergency department from [facility] via EMS for chief concerns of altered mental status.   Vitals in the ED showed temperature of 99.4, respiration rate 19, heart rate of 73, blood pressure 112/71, SpO2 of 93% on room air.   Serum sodium is 129, potassium 5.0, chloride 93, serum creatinine 1.90, eGFR 24, nonfasting blood glucose 103, WBC 19, hemoglobin 14.2, platelets of 145.   UA was positive for moderate leukocytes.  Blood cultures x 2 are in process.  Urine culture is in process.   ED treatment: Ceftriaxone 2 g IV 7-day course were ordered, sodium chloride 1 L bolus, acetaminophen 650 mg p.o. one-time dose ------------------------------ At bedside, patient able to tell me her first and last name, her age correctly, her location of hospital bed, and the current calendar year.   She was not able to tell me why she is in the hospital.  She shakes her head denying being in pain when asked.   I attempted to feed her at bedside, patient chews her food pretty well and then ultimately spits it out shaking her head towards the food.   Per daughter over the phone, facility did not report fever, vomiting, diarrhea.   Daughter reports that patient has had change in mentation since Friday because she calls and talks to her mother every day.   Daughter further reports that he was noted she has poor p.o. intake for the past several days.   Social history: Patient is from [facility] nursing facility.  She denies tobacco, EtOH, recreational drug use.  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   ROS: Unable to complete as patient has severe hearing difficulties   Kathy Lowery   ACZ:[id number] DOB: 09-09-1924 DOA: 02/07/2023 PCP: Earnestine Mealing, MD    Assessment & Plan:   Principal Problem:   Severe sepsis with acute organ dysfunction (HCC) Active Problems:   AKI (acute kidney injury) (HCC)   Acute hypoxic respiratory failure (HCC)   Essential hypertension   Coronary artery disease   Atrial fibrillation and flutter (HCC)   Hyperlipidemia, mixed   Quadriplegia, functional (HCC)   PAD (peripheral artery disease) (HCC)   Mild cognitive impairment   Altered mental state   Hard of hearing   Metabolic acidosis   Elevated troponin  Assessment and Plan: Severe sepsis: see Dr. Renea Ee notes on how pt met severe sepsis criteria. Possibly secondary to aspiration pneumonia & bacteremia. Continue on IV abxs, bronchodilators & encourage incentive spirometry. Sepsis resolved   Likely aspiration pneumonia: continue on IV rocephin, azithromycin, bronchodilators & encourage incentive spirometry.   UTI: urine cx is growing e. coli.  Continue on IV rocephin   Bacteremia: blood cultures growing from e. coli. Likely secondary to UTI. Repeat blood cxs NGTD. Continue on IV rocephin    Acute hypoxic respiratory failure: likely secondary to pneumonia. Continue on IV abxs. Continue on supplemental oxygen and wean as tolerated.    AKI: resolved   Hyponatremia: etiology unclear, ? SIADH. Labile.   HTN: holding home dose of aldactone, lasix, propranolol as BP is on end of normal    Elevated troponin: likely secondary to demand ischemia   Metabolic acidosis: continue on sodium bicarb    Hard of hearing: chronic. Continue  w/ supportive care  Encephalopathy: likely secondary to above infections, pneumonia & bacteremia. Continue on IV abxs    Quadriplegia: functional. Continue w/ supportive care    Likely PAF: continue on eliquis. Holding home dose of propranolol    Obesity: BMI 30.9. Would benefit from weight loss       DVT prophylaxis: eliquis  Code Status:  DNR Family Communication: called pt's daugther, [name], and no answer  Disposition Plan: PT/OT recs SNF   Level of care: Progressive Status is: Inpatient Remains inpatient appropriate because: severity of illness. Needs SNF placement. Pt lives alone and is unable to take of herself but pt feels like she is able to take care of herself though. Pt wants to go home.     Consultants:    Procedures:   Antimicrobials: rocephin, azithromycin    Subjective: Pt c/o malaise   Objective: Vitals:   02/10/23 1950 02/10/23 2310 02/11/23 0403 02/11/23 0800  BP: 130/65 (!) 132/54 138/71 128/66  Pulse: 77 72 60 69  Resp: (!) 22 18 20 16   Temp: 98.5 F (36.9 C) 98.5 F (36.9 C) 97.8 F (36.6 C) 97.7 F (36.5 C)  TempSrc:  Oral    SpO2: 91% 90% 95% 97%  Weight:        Intake/Output Summary (Last 24 hours) at 02/11/2023 1108 Last data filed at 02/10/2023 2345 Gross per 24 hour  Intake 150 ml  Output 800 ml  Net -650 ml   Filed Weights   02/09/23 1313  Weight: 80.4 kg    Examination:  General exam: appears comfortable  Respiratory system: diminished breath sounds b/l Cardiovascular system: S1/S2+. No rubs or clicks  Gastrointestinal system: abd is soft, NT, ND & hypoactive bowel sounds Central nervous system: alert & awake. Moves all extremities  Psychiatry: Judgement and insight appears poor. Flat mood and affect    Data Reviewed: I have personally reviewed following labs and imaging studies  CBC: Recent Labs  Lab 02/07/23 1320 02/08/23 0320 02/10/23 0640 02/11/23 0513  WBC 19.0* 17.4* 11.3* 10.7*  NEUTROABS 16.0*  --   --   --   HGB 14.2 12.5 13.0 11.8*  HCT 41.0 36.7 37.7 34.5*  MCV 88.7 92.0 90.6 89.8  PLT 145* 118* 146* 169   Basic Metabolic Panel: Recent Labs  Lab 02/07/23 1320 02/08/23 0320 02/10/23 0640 02/11/23 0513  NA 129* 128* 129* 134*  K 5.0 3.5 3.3* 3.6  CL 93* 99 102 108  CO2 20* 19* 20* 20*  GLUCOSE 103* 117* 76 96  BUN 95* 74* 51*  42*  CREATININE 1.90* 1.44* 0.95 0.82  CALCIUM 9.1 8.2* 8.5* 8.5*   GFR: Estimated Creatinine Clearance: 40.7 mL/min (by C-G formula based on SCr of 0.82 mg/dL). Liver Function Tests: Recent Labs  Lab 02/07/23 2148 02/08/23 0320 02/10/23 0640 02/11/23 0513  AST 19 20 16 15   ALT 10 11 9 13   ALKPHOS 97 96 103 102  BILITOT 1.0 1.5* 1.0 0.7  PROT 5.9* 6.0* 6.0* 5.4*  ALBUMIN 2.4* 2.4* 2.3* 2.1*   No results for input(s): "LIPASE", "AMYLASE" in the last 168 hours. No results for input(s): "AMMONIA" in the last 168 hours. Coagulation Profile: No results for input(s): "INR", "PROTIME" in the last 168 hours. Cardiac Enzymes: No results for input(s): "CKTOTAL", "CKMB", "CKMBINDEX", "TROPONINI" in the last 168 hours. BNP (last 3 results) No results for input(s): "PROBNP" in the last 8760 hours. HbA1C: No results for input(s): "HGBA1C" in the last 72 hours. CBG: No results  for input(s): "GLUCAP" in the last 168 hours. Lipid Profile: No results for input(s): "CHOL", "HDL", "LDLCALC", "TRIG", "CHOLHDL", "LDLDIRECT" in the last 72 hours. Thyroid Function Tests: No results for input(s): "TSH", "T4TOTAL", "FREET4", "T3FREE", "THYROIDAB" in the last 72 hours. Anemia Panel: No results for input(s): "VITAMINB12", "FOLATE", "FERRITIN", "TIBC", "IRON", "RETICCTPCT" in the last 72 hours. Sepsis Labs: Recent Labs  Lab 02/07/23 1320 02/07/23 2148 02/08/23 0320  PROCALCITON 27.93  --  14.43  LATICACIDVEN 1.6 0.9  --     Recent Results (from the past 240 hours)  Blood culture (routine x 2)     Status: Abnormal   Collection Time: 02/07/23  1:20 PM   Specimen: BLOOD  Result Value Ref Range Status   Specimen Description   Final    BLOOD RIGTH ARM Performed at [facility], [address], [place], [place] [address]    Special Requests   Final    BOTTLES DRAWN AEROBIC AND ANAEROBIC Blood Culture results may not be optimal due to an inadequate volume of blood received in culture  bottles Performed at [facility], [address] [place], [place] [address]    Culture  Setup Time   Final    Organism ID to follow GRAM NEGATIVE RODS IN BOTH AEROBIC AND ANAEROBIC BOTTLES CRITICAL RESULT CALLED TO, READ BACK BY AND VERIFIED WITH: NATHAN BELUE PHARMD @0039  02/08/23 ASW    Culture ESCHERICHIA COLI (A)  Final   Report Status 02/10/2023 FINAL  Final   Organism ID, Bacteria ESCHERICHIA COLI  Final   Organism ID, Bacteria ESCHERICHIA COLI  Final      Susceptibility   Escherichia coli - KIRBY BAUER*    CEFAZOLIN SENSITIVE Sensitive    Escherichia coli - MIC*    AMPICILLIN >=32 RESISTANT Resistant     CEFEPIME <=0.12 SENSITIVE Sensitive     CEFTAZIDIME <=1 SENSITIVE Sensitive     CEFTRIAXONE <=0.25 SENSITIVE Sensitive     CIPROFLOXACIN <=0.25 SENSITIVE Sensitive     GENTAMICIN <=1 SENSITIVE Sensitive     IMIPENEM <=0.25 SENSITIVE Sensitive     TRIMETH/SULFA <=20 SENSITIVE Sensitive     AMPICILLIN/SULBACTAM 16 INTERMEDIATE Intermediate     PIP/TAZO <=4 SENSITIVE Sensitive ug/mL    * ESCHERICHIA COLI    ESCHERICHIA COLI  Blood culture (routine x 2)     Status: Abnormal   Collection Time: 02/07/23  1:20 PM   Specimen: BLOOD  Result Value Ref Range Status   Specimen Description   Final    BLOOD LEFT ARM Performed at [facility], [address] [place], [place] [address]    Special Requests   Final    BOTTLES DRAWN AEROBIC AND ANAEROBIC Blood Culture results may not be optimal due to an inadequate volume of blood received in culture bottles Performed at [facility], [address] [place], [place] [address]    Culture  Setup Time   Final    GRAM NEGATIVE RODS IN BOTH AEROBIC AND ANAEROBIC BOTTLES CRITICAL VALUE NOTED.  VALUE IS CONSISTENT WITH PREVIOUSLY REPORTED AND CALLED VALUE. Performed at [facility], [address] [place], [place] [address]    Culture (A)  Final    ESCHERICHIA COLI SUSCEPTIBILITIES PERFORMED  ON PREVIOUS CULTURE WITHIN THE LAST 5 DAYS. Performed at [facility], [address], [place], [place] [address]    Report Status 02/10/2023 FINAL  Final  Blood Culture ID Panel (Reflexed)     Status: Abnormal   Collection Time:  02/07/23  1:20 PM  Result Value Ref Range Status   Enterococcus faecalis NOT DETECTED NOT DETECTED Final   Enterococcus Faecium NOT DETECTED NOT DETECTED Final   Listeria monocytogenes NOT DETECTED NOT DETECTED Final   Staphylococcus species NOT DETECTED NOT DETECTED Final   Staphylococcus aureus (BCID) NOT DETECTED NOT DETECTED Final   Staphylococcus epidermidis NOT DETECTED NOT DETECTED Final   Staphylococcus lugdunensis NOT DETECTED NOT DETECTED Final   Streptococcus species NOT DETECTED NOT DETECTED Final   Streptococcus agalactiae NOT DETECTED NOT DETECTED Final   Streptococcus pneumoniae NOT DETECTED NOT DETECTED Final   Streptococcus pyogenes NOT DETECTED NOT DETECTED Final   A.calcoaceticus-baumannii NOT DETECTED NOT DETECTED Final   Bacteroides fragilis NOT DETECTED NOT DETECTED Final   Enterobacterales DETECTED (A) NOT DETECTED Final    Comment: Enterobacterales represent a large order of gram negative bacteria, not a single organism. CRITICAL RESULT CALLED TO, READ BACK BY AND VERIFIED WITH: NATHAN BELUE PHARMD @0039  02/08/23 ASW    Enterobacter cloacae complex NOT DETECTED NOT DETECTED Final   Escherichia coli DETECTED (A) NOT DETECTED Final    Comment: CRITICAL RESULT CALLED TO, READ BACK BY AND VERIFIED WITH: NATHAN BELUE PHARMD @0039  02/08/23 ASW    Klebsiella aerogenes NOT DETECTED NOT DETECTED Final   Klebsiella oxytoca NOT DETECTED NOT DETECTED Final   Klebsiella pneumoniae NOT DETECTED NOT DETECTED Final   Proteus species NOT DETECTED NOT DETECTED Final   Salmonella species NOT DETECTED NOT DETECTED Final   Serratia marcescens NOT DETECTED NOT DETECTED Final   Haemophilus influenzae NOT DETECTED NOT DETECTED Final   Neisseria  meningitidis NOT DETECTED NOT DETECTED Final   Pseudomonas aeruginosa NOT DETECTED NOT DETECTED Final   Stenotrophomonas maltophilia NOT DETECTED NOT DETECTED Final   Candida albicans NOT DETECTED NOT DETECTED Final   Candida auris NOT DETECTED NOT DETECTED Final   Candida glabrata NOT DETECTED NOT DETECTED Final   Candida krusei NOT DETECTED NOT DETECTED Final   Candida parapsilosis NOT DETECTED NOT DETECTED Final   Candida tropicalis NOT DETECTED NOT DETECTED Final   Cryptococcus neoformans/gattii NOT DETECTED NOT DETECTED Final   CTX-M ESBL NOT DETECTED NOT DETECTED Final   Carbapenem resistance IMP NOT DETECTED NOT DETECTED Final   Carbapenem resistance KPC NOT DETECTED NOT DETECTED Final   Carbapenem resistance NDM NOT DETECTED NOT DETECTED Final   Carbapenem resist OXA 48 LIKE NOT DETECTED NOT DETECTED Final   Carbapenem resistance VIM NOT DETECTED NOT DETECTED Final    Comment: Performed at [facility], [address] [place], [place] [address]  Urine Culture     Status: Abnormal   Collection Time: 02/07/23  1:21 PM   Specimen: Urine, Catheterized  Result Value Ref Range Status   Specimen Description   Final    URINE, CATHETERIZED Performed at [facility], [address] [place], [place] [address]    Special Requests   Final    NONE Reflexed from [id number] Performed at [facility], [address] [place], [place] [address]    Culture >=100,000 COLONIES/mL ESCHERICHIA COLI (A)  Final   Report Status 02/10/2023 FINAL  Final   Organism ID, Bacteria ESCHERICHIA COLI (A)  Final      Susceptibility   Escherichia coli - MIC*    AMPICILLIN >=32 RESISTANT Resistant     CEFAZOLIN <=4 SENSITIVE Sensitive     CEFEPIME <=0.12 SENSITIVE Sensitive     CEFTRIAXONE <=0.25 SENSITIVE Sensitive     CIPROFLOXACIN <=0.25 SENSITIVE Sensitive  GENTAMICIN <=1 SENSITIVE Sensitive     IMIPENEM <=0.25 SENSITIVE Sensitive     NITROFURANTOIN <=16 SENSITIVE Sensitive      TRIMETH/SULFA <=20 SENSITIVE Sensitive     AMPICILLIN/SULBACTAM 16 INTERMEDIATE Intermediate     PIP/TAZO <=4 SENSITIVE Sensitive ug/mL    * >=100,000 COLONIES/mL ESCHERICHIA COLI  Surgical pcr screen     Status: None   Collection Time: 02/08/23  3:20 AM   Specimen: Nasal Mucosa; Nasal Swab  Result Value Ref Range Status   MRSA, PCR NEGATIVE NEGATIVE Final   Staphylococcus aureus NEGATIVE NEGATIVE Final    Comment: (NOTE) The Xpert SA Assay (FDA approved for NASAL specimens in patients 48 years of age and older), is one component of a comprehensive surveillance program. It is not intended to diagnose infection nor to guide or monitor treatment. Performed at [facility], [address] [place], [place] [address]   Culture, blood (Routine X 2) w Reflex to ID Panel     Status: None (Preliminary result)   Collection Time: 02/10/23  4:39 PM   Specimen: BLOOD  Result Value Ref Range Status   Specimen Description BLOOD BLOOD RIGHT ARM  Final   Special Requests   Final    BOTTLES DRAWN AEROBIC AND ANAEROBIC Blood Culture results may not be optimal due to an inadequate volume of blood received in culture bottles   Culture   Final    NO GROWTH < 12 HOURS Performed at [facility], [address], [place], [place] [address]    Report Status PENDING  Incomplete  Culture, blood (Routine X 2) w Reflex to ID Panel     Status: None (Preliminary result)   Collection Time: 02/10/23  4:41 PM   Specimen: BLOOD  Result Value Ref Range Status   Specimen Description BLOOD BLOOD RIGHT HAND  Final   Special Requests   Final    BOTTLES DRAWN AEROBIC AND ANAEROBIC Blood Culture results may not be optimal due to an inadequate volume of blood received in culture bottles   Culture   Final    NO GROWTH < 12 HOURS Performed at [facility], [address] [place], [place] [address]    Report Status PENDING  Incomplete         Radiology Studies: No results  found.       Scheduled Meds:  apixaban  5 mg Oral BID   azithromycin  500 mg Oral QHS   nystatin  5 mL Oral QID   sodium bicarbonate  650 mg Oral BID   Continuous Infusions:  cefTRIAXone (ROCEPHIN)  IV 2 g (02/10/23 1559)     LOS: 4 days       Charise Killian, MD [facility] Pager [phone]  If 7PM-7AM, please contact night-coverage [email or web address] 02/11/2023, 11:08 AM

## 2023-02-11 NOTE — Plan of Care (Signed)
  Problem: Clinical Measurements: Goal: Will remain free from infection Outcome: Progressing Goal: Diagnostic test results will improve Outcome: Progressing Goal: Respiratory complications will improve Outcome: Progressing   Problem: Nutrition: Goal: Adequate nutrition will be maintained Outcome: Progressing   

## 2023-02-12 DIAGNOSIS — J9601 Acute respiratory failure with hypoxia: Secondary | ICD-10-CM | POA: Diagnosis not present

## 2023-02-12 DIAGNOSIS — G9341 Metabolic encephalopathy: Secondary | ICD-10-CM

## 2023-02-12 DIAGNOSIS — I5A Non-ischemic myocardial injury (non-traumatic): Secondary | ICD-10-CM | POA: Insufficient documentation

## 2023-02-12 DIAGNOSIS — N179 Acute kidney failure, unspecified: Secondary | ICD-10-CM | POA: Diagnosis not present

## 2023-02-12 DIAGNOSIS — I10 Essential (primary) hypertension: Secondary | ICD-10-CM

## 2023-02-12 DIAGNOSIS — R532 Functional quadriplegia: Secondary | ICD-10-CM

## 2023-02-12 DIAGNOSIS — I482 Chronic atrial fibrillation, unspecified: Secondary | ICD-10-CM

## 2023-02-12 DIAGNOSIS — E872 Acidosis, unspecified: Secondary | ICD-10-CM

## 2023-02-12 DIAGNOSIS — D696 Thrombocytopenia, unspecified: Secondary | ICD-10-CM

## 2023-02-12 DIAGNOSIS — E782 Mixed hyperlipidemia: Secondary | ICD-10-CM

## 2023-02-12 DIAGNOSIS — L8990 Pressure ulcer of unspecified site, unspecified stage: Secondary | ICD-10-CM | POA: Insufficient documentation

## 2023-02-12 DIAGNOSIS — E669 Obesity, unspecified: Secondary | ICD-10-CM

## 2023-02-12 DIAGNOSIS — A419 Sepsis, unspecified organism: Secondary | ICD-10-CM | POA: Diagnosis not present

## 2023-02-12 DIAGNOSIS — I739 Peripheral vascular disease, unspecified: Secondary | ICD-10-CM

## 2023-02-12 LAB — BASIC METABOLIC PANEL
Anion gap: 9 (ref 5–15)
BUN: 37 mg/dL — ABNORMAL HIGH (ref 8–23)
CO2: 22 mmol/L (ref 22–32)
Calcium: 8.2 mg/dL — ABNORMAL LOW (ref 8.9–10.3)
Chloride: 105 mmol/L (ref 98–111)
Creatinine, Ser: 0.72 mg/dL (ref 0.44–1.00)
GFR, Estimated: >60 mL/min (ref >60)
Glucose, Bld: 132 mg/dL — ABNORMAL HIGH (ref 70–99)
Potassium: 3.4 mmol/L — ABNORMAL LOW (ref 3.5–5.1)
Sodium: 136 mmol/L (ref 135–145)

## 2023-02-12 LAB — CBC
HCT: 35.7 % — ABNORMAL LOW (ref 36.0–46.0)
Hemoglobin: 12 g/dL (ref 12.0–15.0)
MCH: 31.5 pg (ref 26.0–34.0)
MCHC: 33.6 g/dL (ref 30.0–36.0)
MCV: 93.7 fL (ref 80.0–100.0)
Platelets: 207 10*3/uL (ref 150–400)
RBC: 3.81 MIL/uL — ABNORMAL LOW (ref 3.87–5.11)
RDW: 15.9 % — ABNORMAL HIGH (ref 11.5–15.5)
WBC: 11.3 10*3/uL — ABNORMAL HIGH (ref 4.0–10.5)
nRBC: 0 % (ref 0.0–0.2)

## 2023-02-12 MED ORDER — POTASSIUM CHLORIDE CRYS ER 20 MEQ PO TBCR
20.0000 meq | EXTENDED_RELEASE_TABLET | Freq: Once | ORAL | Status: AC
  Administered 2023-02-12: 20 meq via ORAL
  Filled 2023-02-12: qty 1

## 2023-02-12 MED ORDER — MUPIROCIN 2 % EX OINT
TOPICAL_OINTMENT | Freq: Two times a day (BID) | CUTANEOUS | Status: DC
  Filled 2023-02-12: qty 22

## 2023-02-12 MED ORDER — SERTRALINE HCL 50 MG PO TABS
25.0000 mg | ORAL_TABLET | Freq: Every day | ORAL | Status: DC
  Administered 2023-02-12 – 2023-02-15 (×4): 25 mg via ORAL
  Filled 2023-02-12 (×4): qty 1

## 2023-02-12 NOTE — Assessment & Plan Note (Signed)
Demand ischemia from sepsis.

## 2023-02-12 NOTE — Progress Notes (Addendum)
Progress Note   Patient: Kathy Lowery ZOX:[id number] DOB: [birth year] DOA: 02/07/2023     5 DOS: the patient was seen and examined on 02/12/2023   Brief hospital course: Kathy Lowery is a 89 year old female with atrial fibrillation and flutter, quadriplegia functional, on chronic anticoagulation with Eliquis, history of intertrigo, hyperlipidemia, hypertension, depression, anxiety, GERD, who presents to the emergency department from [facility] via EMS for chief concerns of altered mental status.  Vitals in the ED showed temperature of 99.4, respiration rate 19, heart rate of 73, blood pressure 112/71, SpO2 of 93% on room air.  Serum sodium is 129, potassium 5.0, chloride 93, serum creatinine 1.90, eGFR 24, nonfasting blood glucose 103, WBC 19, hemoglobin 14.2, platelets of 145.  UA was positive for moderate leukocytes.  Blood cultures x 2 are in process.  Urine culture is in process.  ED treatment: Ceftriaxone 2 g IV 7-day course were ordered, sodium chloride 1 L bolus, acetaminophen 650 mg p.o. one-time dose  Blood cultures and urine cultures positive for E. coli.  Patient has been on Rocephin.  Physical therapy recommending rehab.  Patient lives at [facility].  Assessment and Plan: * Severe sepsis with acute organ dysfunction (HCC) Present on admission with positive blood cultures and urine culture, acute kidney injury, acute respiratory failure, acute metabolic encephalopathy, thrombocytopenia, leukocytosis and tachypnea   Acute hypoxic respiratory failure (HCC) Patient required supplemental oxygen.  Patient did have a pulse ox of 89% on 1.5 L.  Currently off oxygen.  AKI (acute kidney injury) (HCC) Creatinine 1.9 on presentation down to 0.72  Thrombocytopenia (HCC) Secondary to sepsis.  Platelet count now in the normal range at 207.  Acute metabolic encephalopathy Secondary to sepsis  Obesity (BMI 30-39.9) BMI 30.91  Essential hypertension Blood pressure stable off  medication  Myocardial injury Demand ischemia from sepsis.  Pressure injury of skin Sacrum stage I documented on 12/12.  See full description below.  Metabolic acidosis Improved get rid of bicarb tablets  Hard of hearing Severe, chronic  PAD (peripheral artery disease) (HCC) Continue Eliquis  Quadriplegia, functional (HCC) Patient mostly wheelchair-bound.  Atrial fibrillation, chronic (HCC) Continue Eliquis        Subjective: Patient feels okay.  Always has a little difficulty getting her words out and speaks slower.  Admitted with sepsis.  Not having any burning on urination.  Physical Exam: Vitals:   02/11/23 2342 02/12/23 0458 02/12/23 0742 02/12/23 1207  BP: 133/63 (!) 138/58 136/65 125/62  Pulse: 67 62 64 66  Resp: 20 18 20    Temp: 97.6 F (36.4 C) 98.3 F (36.8 C) 98.1 F (36.7 C) 97.6 F (36.4 C)  TempSrc: Oral Oral Oral   SpO2: 97% 95% 94% 92%  Weight:       Physical Exam HENT:     Head: Normocephalic.     Mouth/Throat:     Pharynx: No oropharyngeal exudate.  Eyes:     General: Lids are normal.     Conjunctiva/sclera: Conjunctivae normal.  Cardiovascular:     Rate and Rhythm: Normal rate and regular rhythm.     Heart sounds: Normal heart sounds, S1 normal and S2 normal.  Pulmonary:     Breath sounds: No decreased breath sounds, wheezing, rhonchi or rales.  Abdominal:     Palpations: Abdomen is soft.     Tenderness: There is no abdominal tenderness.  Musculoskeletal:     Right lower leg: No swelling.     Left lower leg: No swelling.  Skin:  General: Skin is warm.     Findings: No rash.  Neurological:     Mental Status: She is alert.     Comments: Slow with her answers but able to answer questions appropriately.     Data Reviewed: Blood cultures and urine cultures positive for E. coli.  Repeat blood cultures negative, potassium 3.4, creatinine 0.72, hemoglobin 12.0, white blood cell count 11.3, platelet count 207 Family Communication:  Spoke with POA at bedside  Disposition: Status is: Inpatient Remains inpatient appropriate because: TOC to look into [facility] rehab  Planned Discharge Destination: Rehab    Time spent: 28 minutes  Author: Alford Highland, MD 02/12/2023 4:04 PM  For on call review [email or web address].

## 2023-02-12 NOTE — Plan of Care (Signed)
  Problem: Clinical Measurements: Goal: Signs and symptoms of infection will decrease Outcome: Progressing   Problem: Respiratory: Goal: Ability to maintain adequate ventilation will improve Outcome: Progressing   Problem: Clinical Measurements: Goal: Will remain free from infection Outcome: Progressing   Problem: Nutrition: Goal: Adequate nutrition will be maintained Outcome: Progressing   Problem: Elimination: Goal: Will not experience complications related to bowel motility Outcome: Progressing Goal: Will not experience complications related to urinary retention Outcome: Progressing   Problem: Skin Integrity: Goal: Risk for impaired skin integrity will decrease Outcome: Progressing   Problem: Safety: Goal: Ability to remain free from injury will improve Outcome: Progressing

## 2023-02-12 NOTE — Assessment & Plan Note (Signed)
-   Continue Eliquis 

## 2023-02-12 NOTE — Assessment & Plan Note (Signed)
Sacrum stage I documented on 12/12.  See full description below.

## 2023-02-12 NOTE — Assessment & Plan Note (Signed)
BMI 30.91

## 2023-02-12 NOTE — Assessment & Plan Note (Signed)
Secondary to sepsis.  Platelet count now in the normal range at 207.

## 2023-02-12 NOTE — Assessment & Plan Note (Signed)
Secondary to sepsis. °

## 2023-02-12 NOTE — NC FL2 (Signed)
[place] MEDICAID FL2 LEVEL OF CARE FORM     IDENTIFICATION  Patient Name: Kathy Lowery Birthdate: 1924/10/16 Sex: female Admission Date (Current Location): 02/07/2023  [facility] and [place] Number:  Chiropodist and Address:  [facility], [address] [place], [place] [address]      Provider Number: [id number]  Attending Physician Name and Address:  Alford Highland, MD  Relative Name and Phone Number:  Kerrie Buffalo (Daughter)  [phone] (Mobile)    Current Level of Care: Hospital Recommended Level of Care: Skilled Nursing Facility Prior Approval Number: [id number]  Date Approved/Denied: 03/10/04 PASRR Number: [id number]  Discharge Plan: SNF    Current Diagnoses: Patient Active Problem List   Diagnosis Date Noted   Severe sepsis with acute organ dysfunction (HCC) 02/07/2023   AKI (acute kidney injury) (HCC) 02/07/2023   Altered mental state 02/07/2023   Hard of hearing 02/07/2023   Acute hypoxic respiratory failure (HCC) 02/07/2023   Metabolic acidosis 02/07/2023   Elevated troponin 02/07/2023   PAD (peripheral artery disease) (HCC) 12/07/2022   Mild cognitive impairment 12/07/2022   Venous stasis dermatitis 12/07/2022   Mycotic toenails 12/07/2022   Quadriplegia, functional (HCC) 11/22/2021   Atrial fibrillation and flutter (HCC) 04/26/2018   Hyperlipidemia, mixed 03/29/2016   Medicare annual wellness visit, initial 03/29/2016   Chronic atrial fibrillation (HCC)    Coronary artery disease    NSTEMI (non-ST elevated myocardial infarction) (HCC) 04/22/2015   Current use of long term anticoagulation 04/22/2015   Essential hypertension 04/22/2015   Obesity (BMI 30-39.9) 04/22/2015   Hypokalemia 04/22/2015   CHF (congestive heart failure) (HCC) 04/19/2015    Orientation RESPIRATION BLADDER Height & Weight     Self, Place  Normal External catheter Weight: 80.4 kg Height:     BEHAVIORAL SYMPTOMS/MOOD NEUROLOGICAL  BOWEL NUTRITION STATUS      Continent Diet (SLP consult, see chart.)  AMBULATORY STATUS COMMUNICATION OF NEEDS Skin   Extensive Assist Verbally PU Stage and Appropriate Care                       Personal Care Assistance Level of Assistance  Bathing, Dressing, Feeding Bathing Assistance: Maximum assistance Feeding assistance: Limited assistance Dressing Assistance: Maximum assistance     Functional Limitations Info  Sight, Hearing Sight Info: Impaired Hearing Info: Impaired      SPECIAL CARE FACTORS FREQUENCY  PT (By licensed PT), OT (By licensed OT)     PT Frequency: 5x/week OT Frequency: 5x/week            Contractures Contractures Info: Present    Additional Factors Info  Code Status Code Status Info: DNR=Limited: See Chart for details             Current Medications (02/12/2023):  This is the current hospital active medication list Current Facility-Administered Medications  Medication Dose Route Frequency Provider Last Rate Last Admin   acetaminophen (TYLENOL) tablet 650 mg  650 mg Oral Q6H PRN Cox, Amy N, DO   650 mg at 02/08/23 1108   Or   acetaminophen (TYLENOL) suppository 650 mg  650 mg Rectal Q6H PRN Cox, Amy N, DO       apixaban (ELIQUIS) tablet 5 mg  5 mg Oral BID Charise Killian, MD   5 mg at 02/12/23 0958   cefTRIAXone (ROCEPHIN) 2 g in sodium chloride 0.9 % 100 mL IVPB  2 g Intravenous Q24H Jawo, Modou L, NP 200 mL/hr at 02/12/23 1523 2 g at  02/12/23 1523   hydrALAZINE (APRESOLINE) injection 5 mg  5 mg Intravenous Q6H PRN Cox, Amy N, DO       morphine (PF) 2 MG/ML injection 1 mg  1 mg Intravenous Q4H PRN Charise Killian, MD       nystatin (MYCOSTATIN) 100000 UNIT/ML suspension 500,000 Units  5 mL Oral QID Charise Killian, MD   500,000 Units at 02/12/23 1500   ondansetron (ZOFRAN) tablet 4 mg  4 mg Oral Q6H PRN Cox, Amy N, DO       Or   ondansetron (ZOFRAN) injection 4 mg  4 mg Intravenous Q6H PRN Cox, Amy N, DO       Oral care  mouth rinse  15 mL Mouth Rinse PRN Charise Killian, MD       oxyCODONE-acetaminophen (PERCOCET/ROXICET) 5-325 MG per tablet 1 tablet  1 tablet Oral Q6H PRN Charise Killian, MD   1 tablet at 02/09/23 1531   sodium bicarbonate tablet 650 mg  650 mg Oral BID Charise Killian, MD   650 mg at 02/12/23 1610     Discharge Medications: Please see discharge summary for a list of discharge medications.  Relevant Imaging Results:  Relevant Lab Results:   Additional Information SS# [id number]  Bing Quarry, RN

## 2023-02-13 DIAGNOSIS — I5023 Acute on chronic systolic (congestive) heart failure: Secondary | ICD-10-CM

## 2023-02-13 DIAGNOSIS — A419 Sepsis, unspecified organism: Secondary | ICD-10-CM | POA: Diagnosis not present

## 2023-02-13 DIAGNOSIS — G9341 Metabolic encephalopathy: Secondary | ICD-10-CM | POA: Diagnosis not present

## 2023-02-13 DIAGNOSIS — H9190 Unspecified hearing loss, unspecified ear: Secondary | ICD-10-CM

## 2023-02-13 DIAGNOSIS — I5022 Chronic systolic (congestive) heart failure: Secondary | ICD-10-CM

## 2023-02-13 DIAGNOSIS — J9601 Acute respiratory failure with hypoxia: Secondary | ICD-10-CM | POA: Diagnosis not present

## 2023-02-13 DIAGNOSIS — N179 Acute kidney failure, unspecified: Secondary | ICD-10-CM | POA: Diagnosis not present

## 2023-02-13 MED ORDER — ENSURE ENLIVE PO LIQD
237.0000 mL | Freq: Three times a day (TID) | ORAL | Status: DC
  Administered 2023-02-13 – 2023-02-15 (×6): 237 mL via ORAL

## 2023-02-13 MED ORDER — FUROSEMIDE 20 MG PO TABS
20.0000 mg | ORAL_TABLET | Freq: Every day | ORAL | Status: DC
  Administered 2023-02-14: 20 mg via ORAL
  Filled 2023-02-13: qty 1

## 2023-02-13 MED ORDER — SPIRONOLACTONE 25 MG PO TABS
25.0000 mg | ORAL_TABLET | Freq: Every day | ORAL | Status: DC
  Administered 2023-02-13 – 2023-02-15 (×3): 25 mg via ORAL
  Filled 2023-02-13 (×3): qty 1

## 2023-02-13 MED ORDER — FUROSEMIDE 10 MG/ML IJ SOLN
20.0000 mg | Freq: Once | INTRAMUSCULAR | Status: AC
  Administered 2023-02-13: 20 mg via INTRAVENOUS
  Filled 2023-02-13: qty 2

## 2023-02-13 MED ORDER — CEPHALEXIN 500 MG PO CAPS
500.0000 mg | ORAL_CAPSULE | Freq: Four times a day (QID) | ORAL | Status: DC
  Administered 2023-02-13 – 2023-02-15 (×8): 500 mg via ORAL
  Filled 2023-02-13 (×8): qty 1

## 2023-02-13 MED ORDER — METOPROLOL SUCCINATE ER 25 MG PO TB24
12.5000 mg | ORAL_TABLET | Freq: Every day | ORAL | Status: DC
  Administered 2023-02-14 – 2023-02-15 (×2): 12.5 mg via ORAL
  Filled 2023-02-13 (×2): qty 1

## 2023-02-13 MED ORDER — METOPROLOL SUCCINATE ER 25 MG PO TB24: 12.5000 mg | ORAL_TABLET | Freq: Every day | ORAL | Status: DC

## 2023-02-13 NOTE — Assessment & Plan Note (Addendum)
Give a dose of IV Lasix yesterday.  Received quite a bit of fluids during the hospital course.  Last EF 30% that I see on record.  Continue oral Lasix, low-dose Toprol and spironolactone.

## 2023-02-13 NOTE — Progress Notes (Signed)
Progress Note   Patient: Kathy Lowery GUY:[id number] DOB: [birth year] DOA: 02/07/2023     6 DOS: the patient was seen and examined on 02/13/2023   Brief hospital course: Ms. Kawanna Benzing is a 89 year old female with atrial fibrillation and flutter, quadriplegia functional, on chronic anticoagulation with Eliquis, history of intertrigo, hyperlipidemia, hypertension, depression, anxiety, GERD, who presents to the emergency department from [facility] via EMS for chief concerns of altered mental status.  Vitals in the ED showed temperature of 99.4, respiration rate 19, heart rate of 73, blood pressure 112/71, SpO2 of 93% on room air.  Serum sodium is 129, potassium 5.0, chloride 93, serum creatinine 1.90, eGFR 24, nonfasting blood glucose 103, WBC 19, hemoglobin 14.2, platelets of 145.  UA was positive for moderate leukocytes.  Blood cultures x 2 are in process.  Urine culture is in process.  ED treatment: Ceftriaxone 2 g IV 7-day course were ordered, sodium chloride 1 L bolus, acetaminophen 650 mg p.o. one-time dose  Blood cultures and urine cultures positive for E. coli.  Patient has been on Rocephin.  Physical therapy recommending rehab.  Patient lives at [facility].  12/16.  Waiting for insurance authorization for rehab.  Give a dose of Lasix.  Assessment and Plan: * Severe sepsis with acute organ dysfunction (HCC) Present on admission with positive blood cultures and urine culture, acute kidney injury, acute respiratory failure, acute metabolic encephalopathy, thrombocytopenia, leukocytosis and tachypnea.  Patient received 7 days of Rocephin here will switch over to Keflex for tomorrow.   Acute hypoxic respiratory failure (HCC) Patient required supplemental oxygen.  Patient did have a pulse ox of 89% on 1.5 L.  Patient was on 1 L this morning but taken off oxygen yesterday.  Check pulse ox on room air.  AKI (acute kidney injury) (HCC) Creatinine 1.9 on presentation down to 0.72 will  start back Lasix.  Acute on chronic systolic CHF (congestive heart failure) (HCC) Give a dose of IV Lasix this morning.  Received quite a bit of fluids during the hospital course.  Will switch over to oral Lasix for tomorrow.  Will restart low-dose beta-blocker and spironolactone.  Last EF 30% that I see on record.  Acute metabolic encephalopathy Secondary to sepsis  Thrombocytopenia (HCC) Secondary to sepsis.  Platelet count now in the normal range at 207.  Obesity (BMI 30-39.9) BMI 30.91  Essential hypertension Restart Lasix and spironolactone and low-dose Toprol hopefully tomorrow.  Myocardial injury Demand ischemia from sepsis.  Pressure injury of skin Sacrum stage I documented on 12/12.  See full description below.  Metabolic acidosis Improved get rid of bicarb tablets  Hard of hearing Severe, chronic  PAD (peripheral artery disease) (HCC) Continue Eliquis  Quadriplegia, functional (HCC) Patient mostly wheelchair-bound.  Atrial fibrillation, chronic (HCC) Continue Eliquis        Subjective: Patient feels better.  Does not offer any complaints.  I asked her if she was short of breath and she said no.  She wanted to get out of the hospital back to [facility].  Given dose of Lasix today.  Physical Exam: Vitals:   02/12/23 2311 02/13/23 0330 02/13/23 0726 02/13/23 1243  BP: 131/67 (!) 144/66 (!) 139/59 135/72  Pulse: 65 64 68 73  Resp:      Temp: (!) 97.5 F (36.4 C) 98.5 F (36.9 C) 97.8 F (36.6 C) 98 F (36.7 C)  TempSrc:      SpO2: 93% 95% 94% 97%  Weight:  Physical Exam HENT:     Head: Normocephalic.     Mouth/Throat:     Pharynx: No oropharyngeal exudate.  Eyes:     General: Lids are normal.     Conjunctiva/sclera: Conjunctivae normal.  Cardiovascular:     Rate and Rhythm: Normal rate and regular rhythm.     Heart sounds: S1 normal and S2 normal. Murmur heard.     Systolic murmur is present with a grade of 2/6.  Pulmonary:      Breath sounds: Examination of the right-lower field reveals decreased breath sounds. Examination of the left-lower field reveals decreased breath sounds. Decreased breath sounds present. No wheezing, rhonchi or rales.  Abdominal:     Palpations: Abdomen is soft.     Tenderness: There is no abdominal tenderness.  Musculoskeletal:     Right lower leg: Swelling present.     Left lower leg: Swelling present.  Skin:    General: Skin is warm.     Findings: No rash.  Neurological:     Mental Status: She is alert.     Comments: Slowly answers questions appropriately.     Data Reviewed: Potassium 3.4, creatinine 0.72, hemoglobin 12  Family Communication: Spoke with POA yesterday  Disposition: Status is: Inpatient Remains inpatient appropriate because: We do not have insurance authorization  Planned Discharge Destination: Rehab    Time spent: 28 minutes  Author: Alford Highland, MD 02/13/2023 3:52 PM  For on call review [email or web address].

## 2023-02-13 NOTE — TOC Progression Note (Signed)
Transition of Care [facility]) - Progression Note    Patient Details  Name: Kathy Lowery MRN: [id number] Date of Birth: 13-Dec-1924  Transition of Care [facility]) CM/SW Contact  Truddie Hidden, RN Phone Number: 02/13/2023, 1:36 PM  Clinical Narrative:    [facility] pending in [place].         Expected Discharge Plan and Services                                               Social Determinants of Health (SDOH) Interventions SDOH Screenings   Food Insecurity: Patient Unable To Answer (02/08/2023)  Housing: Patient Unable To Answer (02/08/2023)  Transportation Needs: Patient Unable To Answer (02/08/2023)  Utilities: Not At Risk (02/08/2023)  Depression (PHQ2-9): Low Risk  (12/07/2022)  Financial Resource Strain: Low Risk  (05/31/2022)   Received from [facility]  Tobacco Use: Medium Risk (02/07/2023)    Readmission Risk Interventions     No data to display

## 2023-02-13 NOTE — Plan of Care (Signed)

## 2023-02-13 NOTE — Progress Notes (Signed)
Physical Therapy Treatment Patient Details Name: Kathy Lowery MRN: [id number] DOB: March 23, 1924 Today's Date: 02/13/2023   History of Present Illness Pt is a 89 yo F with afib and atrial flutter, on chronic anticoagulation with Eliquis, history of intertrigo, hyperlipidemia, HTN, depression, anxiety, GERD, who presents to the ED from [facility] via EMS for C/C of AMS. Current MD assessment includes UTI, sepsis, bacteremia, acute resp failure, AKI, hyponatremia, elevated troponin, metabolic acidosis, and encephalopathy.    PT Comments  Pt was pleasant and motivated to participate during the session and put forth good effort throughout. Pt able to follow most 1-step commands and put forth good effort during the session.  Pt required physical assist with all functional tasks but made good progress towards goals as she was able to come to standing and take several small steps this session per below.  Pt found on 1LO2/min with SpO2 in the low 90s and HR WNL during the session, no adverse symptoms noted by the patient.  Pt will benefit from continued PT services upon discharge to safely address deficits listed in patient problem list for decreased caregiver assistance and eventual return to PLOF.      If plan is discharge home, recommend the following: A lot of help with walking and/or transfers;A lot of help with bathing/dressing/bathroom;Assistance with cooking/housework;Direct supervision/assist for medications management;Direct supervision/assist for financial management;Assist for transportation;Help with stairs or ramp for entrance;Other (comment)   Can travel by private vehicle     No  Equipment Recommendations  Other (comment) (TBD at next venue of care)    Recommendations for Other Services       Precautions / Restrictions Precautions Precautions: Fall Restrictions Weight Bearing Restrictions Per Provider Order: No     Mobility  Bed Mobility Overal bed mobility: Needs Assistance Bed  Mobility: Supine to Sit, Sit to Supine     Supine to sit: Mod assist Sit to supine: Max assist   General bed mobility comments: Assist required for BLE and trunk control    Transfers Overall transfer level: Needs assistance Equipment used: Rolling walker (2 wheels) Transfers: Sit to/from Stand Sit to Stand: Min assist, From elevated surface           General transfer comment: Min to mod verbal cues for sequencing    Ambulation/Gait Ambulation/Gait assistance: Min Chemical engineer (Feet): 2 Feet Assistive device: Rolling walker (2 wheels) Gait Pattern/deviations: Trunk flexed, Step-to pattern, Decreased step length - left, Decreased step length - right Gait velocity: decreased     General Gait Details: Pt able to take 3-4 small, mostly shuffling steps at the EOB with min A to guide the RW   Stairs             Wheelchair Mobility     Tilt Bed    Modified Rankin (Stroke Patients Only)       Balance Overall balance assessment: Needs assistance Sitting-balance support: Bilateral upper extremity supported, Feet supported Sitting balance-Leahy Scale: Fair     Standing balance support: Bilateral upper extremity supported, Reliant on assistive device for balance, During functional activity Standing balance-Leahy Scale: Fair                              Cognition Arousal: Alert Behavior During Therapy: WFL for tasks assessed/performed Overall Cognitive Status: No family/caregiver present to determine baseline cognitive functioning  General Comments: Somewhat confused with some difficulty expressing verbally but able to follow most 1-step commands with extra time and cuing        Exercises      General Comments        Pertinent Vitals/Pain Pain Assessment Pain Assessment: No/denies pain    Home Living                          Prior Function            PT Goals (current  goals can now be found in the care plan section) Progress towards PT goals: Progressing toward goals    Frequency    Min 1X/week      PT Plan      Co-evaluation              AM-PAC PT "6 Clicks" Mobility   Outcome Measure  Help needed turning from your back to your side while in a flat bed without using bedrails?: A Little Help needed moving from lying on your back to sitting on the side of a flat bed without using bedrails?: A Lot Help needed moving to and from a bed to a chair (including a wheelchair)?: A Lot Help needed standing up from a chair using your arms (e.g., wheelchair or bedside chair)?: A Little Help needed to walk in hospital room?: Total Help needed climbing 3-5 steps with a railing? : Total 6 Click Score: 12    End of Session Equipment Utilized During Treatment: Oxygen;Gait belt Activity Tolerance: Patient tolerated treatment well Patient left: in bed;with call bell/phone within reach;with bed alarm set;with nursing/sitter in room Nurse Communication: Mobility status PT Visit Diagnosis: Muscle weakness (generalized) (M62.81);Pain;Difficulty in walking, not elsewhere classified (R26.2)     Time: 0981-1914 PT Time Calculation (min) (ACUTE ONLY): 17 min  Charges:    $Therapeutic Activity: 8-22 mins PT General Charges $$ ACUTE PT VISIT: 1 Visit                     D. Scott Lonita Debes PT, DPT 02/13/23, 11:41 AM

## 2023-02-14 DIAGNOSIS — G9341 Metabolic encephalopathy: Secondary | ICD-10-CM | POA: Diagnosis not present

## 2023-02-14 DIAGNOSIS — A419 Sepsis, unspecified organism: Secondary | ICD-10-CM | POA: Diagnosis not present

## 2023-02-14 DIAGNOSIS — J9601 Acute respiratory failure with hypoxia: Secondary | ICD-10-CM | POA: Diagnosis not present

## 2023-02-14 DIAGNOSIS — I2510 Atherosclerotic heart disease of native coronary artery without angina pectoris: Secondary | ICD-10-CM

## 2023-02-14 DIAGNOSIS — N179 Acute kidney failure, unspecified: Secondary | ICD-10-CM | POA: Diagnosis not present

## 2023-02-14 LAB — BASIC METABOLIC PANEL
Anion gap: 6 (ref 5–15)
BUN: 25 mg/dL — ABNORMAL HIGH (ref 8–23)
CO2: 25 mmol/L (ref 22–32)
Calcium: 8.2 mg/dL — ABNORMAL LOW (ref 8.9–10.3)
Chloride: 104 mmol/L (ref 98–111)
Creatinine, Ser: 0.67 mg/dL (ref 0.44–1.00)
GFR, Estimated: >60 mL/min (ref >60)
Glucose, Bld: 95 mg/dL (ref 70–99)
Potassium: 3.6 mmol/L (ref 3.5–5.1)
Sodium: 135 mmol/L (ref 135–145)

## 2023-02-14 MED ORDER — FUROSEMIDE 40 MG PO TABS
40.0000 mg | ORAL_TABLET | Freq: Every day | ORAL | Status: DC
  Administered 2023-02-15: 40 mg via ORAL
  Filled 2023-02-14: qty 1

## 2023-02-14 NOTE — Progress Notes (Signed)
Progress Note   Patient: Caitin Tromp GMW:[id number] DOB: 1925/02/08 DOA: 02/07/2023     7 DOS: the patient was seen and examined on 02/14/2023   Brief hospital course: Ms. Tanaka Andretta is a 89 year old female with atrial fibrillation and flutter, quadriplegia functional, on chronic anticoagulation with Eliquis, history of intertrigo, hyperlipidemia, hypertension, depression, anxiety, GERD, who presents to the emergency department from [facility] via EMS for chief concerns of altered mental status.  Vitals in the ED showed temperature of 99.4, respiration rate 19, heart rate of 73, blood pressure 112/71, SpO2 of 93% on room air.  Serum sodium is 129, potassium 5.0, chloride 93, serum creatinine 1.90, eGFR 24, nonfasting blood glucose 103, WBC 19, hemoglobin 14.2, platelets of 145.  UA was positive for moderate leukocytes.  Blood cultures x 2 are in process.  Urine culture is in process.  ED treatment: Ceftriaxone 2 g IV 7-day course were ordered, sodium chloride 1 L bolus, acetaminophen 650 mg p.o. one-time dose  Blood cultures and urine cultures positive for E. coli.  Patient has been on Rocephin.  Physical therapy recommending rehab.  Patient lives at [facility].  12/16.  Waiting for insurance authorization for rehab.  Give a dose of Lasix. 12/17.  Did not receive insurance authorization early enough for rehab to take today.  Assessment and Plan: * Severe sepsis with acute organ dysfunction (HCC) Present on admission with positive blood cultures and urine culture, acute kidney injury, acute respiratory failure, acute metabolic encephalopathy, thrombocytopenia, leukocytosis and tachypnea.  Patient received 7 days of Rocephin and now on Keflex.   Acute hypoxic respiratory failure (HCC) Patient required supplemental oxygen.  Patient did have a pulse ox of 89% on 1.5 L.  Patient was on 1 L this morning but taken off oxygen 2 days ago.  Patient will go out to [facility] rehab and they can  taper off oxygen there if we are unable to get off oxygen here.  AKI (acute kidney injury) (HCC) Creatinine 1.9 on presentation down to 0.67.  Restarted Lasix yesterday.  Acute on chronic systolic CHF (congestive heart failure) (HCC) Give a dose of IV Lasix yesterday.  Received quite a bit of fluids during the hospital course.  Last EF 30% that I see on record.  Continue oral Lasix, low-dose Toprol and spironolactone.  Acute metabolic encephalopathy Secondary to sepsis  Thrombocytopenia (HCC) Secondary to sepsis.  Last platelet count now in the normal range at 207.  Obesity (BMI 30-39.9) BMI 30.40  Essential hypertension Restarted Lasix, spironolactone and low-dose Toprol.  Myocardial injury Demand ischemia from sepsis.  Pressure injury of skin Sacrum stage I documented on 12/12.  See full description below.  Metabolic acidosis Improved get rid of bicarb tablets  Hard of hearing Severe, chronic  PAD (peripheral artery disease) (HCC) Continue Eliquis  Quadriplegia, functional (HCC) Patient mostly wheelchair-bound.  Atrial fibrillation, chronic (HCC) Continue Eliquis.  Low-dose Toprol.        Subjective: Patient asking again today to go back to [facility].  I advised I was waiting for insurance authorization.  Patient does not complain of any shortness of breath.  Admitted with sepsis.  Physical Exam: Vitals:   02/13/23 1954 02/13/23 2313 02/14/23 0443 02/14/23 0901  BP: 132/64 (!) 150/63 138/80 (!) 141/69  Pulse: 65 68 67 66  Resp: 18 18 18 16   Temp: 97.8 F (36.6 C) 97.7 F (36.5 C) 97.8 F (36.6 C) 97.9 F (36.6 C)  TempSrc: Oral Oral    SpO2:  99% 98% 97% 91%  Weight:      Height:    5\' 3"  (1.6 m)   Physical Exam HENT:     Head: Normocephalic.     Mouth/Throat:     Pharynx: No oropharyngeal exudate.  Eyes:     General: Lids are normal.     Conjunctiva/sclera: Conjunctivae normal.  Cardiovascular:     Rate and Rhythm: Normal rate. Rhythm  irregularly irregular.     Heart sounds: S1 normal and S2 normal. Murmur heard.     Systolic murmur is present with a grade of 2/6.  Pulmonary:     Breath sounds: Examination of the right-lower field reveals decreased breath sounds. Examination of the left-lower field reveals decreased breath sounds. Decreased breath sounds present. No wheezing, rhonchi or rales.  Abdominal:     Palpations: Abdomen is soft.     Tenderness: There is no abdominal tenderness.  Musculoskeletal:     Right lower leg: Swelling present.     Left lower leg: Swelling present.  Skin:    General: Skin is warm.     Findings: No rash.  Neurological:     Mental Status: She is alert.     Comments: Slowly answers questions appropriately.     Data Reviewed: Creatinine 0.67, potassium 3.6  Family Communication: Left message for patient's daughter  Disposition: Status is: Inpatient Remains inpatient appropriate because: We did not receive insurance authorization early enough for the rehab facility to take.  Planned Discharge Destination: Rehab    Time spent: 28 minutes  Author: Alford Highland, MD 02/14/2023 4:19 PM  For on call review [email or web address].

## 2023-02-14 NOTE — Progress Notes (Signed)
Occupational Therapy Treatment Patient Details Name: Kathy Lowery MRN: [id number] DOB: 02-02-1925 Today's Date: 02/14/2023   History of present illness Pt is a 89 yo F with afib and atrial flutter, on chronic anticoagulation with Eliquis, history of intertrigo, hyperlipidemia, HTN, depression, anxiety, GERD, who presents to the ED from [facility] via EMS for C/C of AMS. Current MD assessment includes UTI, sepsis, bacteremia, acute resp failure, AKI, hyponatremia, elevated troponin, metabolic acidosis, and encephalopathy.   OT comments  Ms Cadwell was seen for OT treatment on this date. Upon arrival to room pt in bed noted to be soiled. Pt requires MOD A rolling L+R and MAX A pericare. Pt deferred further mobility citing fatigue. Pt making progress toward goals, will continue to follow POC. Discharge recommendation remains appropriate.       If plan is discharge home, recommend the following:  Two people to help with walking and/or transfers;A lot of help with bathing/dressing/bathroom;Assistance with cooking/housework;Assistance with feeding;Direct supervision/assist for medications management;Direct supervision/assist for financial management;Assist for transportation;Help with stairs or ramp for entrance;Supervision due to cognitive status   Equipment Recommendations  Other (comment) (defer)    Recommendations for Other Services      Precautions / Restrictions Precautions Precautions: Fall Restrictions Weight Bearing Restrictions Per Provider Order: No       Mobility Bed Mobility Overal bed mobility: Needs Assistance Bed Mobility: Rolling Rolling: Mod assist              Transfers                   General transfer comment: pt defers         ADL either performed or assessed with clinical judgement   ADL Overall ADL's : Needs assistance/impaired                                       General ADL Comments: MAX A toileting bed level including  pericare     Cognition Arousal: Alert Behavior During Therapy: WFL for tasks assessed/performed Overall Cognitive Status: No family/caregiver present to determine baseline cognitive functioning                                 General Comments: states she knew her bed was wet but did not call for assistance                   Pertinent Vitals/ Pain       Pain Assessment Pain Assessment: Faces Pain Location: B feet Pain Descriptors / Indicators: Grimacing, Discomfort Pain Intervention(s): Limited activity within patient's tolerance   Frequency  Min 1X/week        Progress Toward Goals  OT Goals(current goals can now be found in the care plan section)  Progress towards OT goals: Progressing toward goals  Acute Rehab OT Goals OT Goal Formulation: Patient unable to participate in goal setting Time For Goal Achievement: 02/24/23 Potential to Achieve Goals: Fair ADL Goals Pt Will Perform Grooming: with modified independence;with adaptive equipment;sitting Pt Will Perform Lower Body Dressing: with modified independence;sit to/from stand Pt Will Transfer to Toilet: with modified independence;bedside commode Pt Will Perform Toileting - Clothing Manipulation and hygiene: with modified independence;sit to/from stand  Plan      Co-evaluation  AM-PAC OT "6 Clicks" Daily Activity     Outcome Measure   Help from another person eating meals?: A Little Help from another person taking care of personal grooming?: A Little Help from another person toileting, which includes using toliet, bedpan, or urinal?: Total Help from another person bathing (including washing, rinsing, drying)?: A Lot Help from another person to put on and taking off regular upper body clothing?: A Little Help from another person to put on and taking off regular lower body clothing?: Total 6 Click Score: 13    End of Session Equipment Utilized During Treatment:  Oxygen  OT Visit Diagnosis: Unsteadiness on feet (R26.81);Muscle weakness (generalized) (M62.81);Other symptoms and signs involving cognitive function   Activity Tolerance Patient limited by fatigue   Patient Left in bed;with call bell/phone within reach;with bed alarm set   Nurse Communication          Time: 4270-6237 OT Time Calculation (min): 15 min  Charges: OT General Charges $OT Visit: 1 Visit OT Treatments $Self Care/Home Management : 8-22 mins  Kathie Dike, M.S. OTR/L  02/14/23, 3:53 PM  ascom [phone]

## 2023-02-14 NOTE — TOC Progression Note (Addendum)
Transition of Care [facility]) - Progression Note    Patient Details  Name: Kathy Lowery MRN: [id number] Date of Birth: Feb 15, 1925  Transition of [facility] CM/SW Contact  Truddie Hidden, RN Phone Number: 02/14/2023, 9:10 AM  Clinical Narrative:   Per [employer] portal auth still pending. MD notified.  Per [employer] portal patient's auth approved 12/17-12/19 Spoke with Sue Lush in Admissions at [facility]. Patient can be accepted tomorrow. MD notified.            Expected Discharge Plan and Services                                               Social Determinants of Health (SDOH) Interventions SDOH Screenings   Food Insecurity: Patient Unable To Answer (02/08/2023)  Housing: Patient Unable To Answer (02/08/2023)  Transportation Needs: Patient Unable To Answer (02/08/2023)  Utilities: Not At Risk (02/08/2023)  Depression (PHQ2-9): Low Risk  (12/07/2022)  Financial Resource Strain: Low Risk  (05/31/2022)   Received from [facility]  Tobacco Use: Medium Risk (02/07/2023)    Readmission Risk Interventions     No data to display

## 2023-02-15 DIAGNOSIS — R6520 Severe sepsis without septic shock: Secondary | ICD-10-CM | POA: Diagnosis not present

## 2023-02-15 DIAGNOSIS — A419 Sepsis, unspecified organism: Secondary | ICD-10-CM | POA: Diagnosis not present

## 2023-02-15 LAB — CULTURE, BLOOD (ROUTINE X 2)
Culture: NO GROWTH
Culture: NO GROWTH

## 2023-02-15 LAB — BASIC METABOLIC PANEL
Anion gap: 6 (ref 5–15)
BUN: 26 mg/dL — ABNORMAL HIGH (ref 8–23)
CO2: 25 mmol/L (ref 22–32)
Calcium: 8.4 mg/dL — ABNORMAL LOW (ref 8.9–10.3)
Chloride: 103 mmol/L (ref 98–111)
Creatinine, Ser: 0.68 mg/dL (ref 0.44–1.00)
GFR, Estimated: >60 mL/min (ref >60)
Glucose, Bld: 85 mg/dL (ref 70–99)
Potassium: 4 mmol/L (ref 3.5–5.1)
Sodium: 134 mmol/L — ABNORMAL LOW (ref 135–145)

## 2023-02-15 MED ORDER — ACETAMINOPHEN ER 650 MG PO TBCR: 650.0000 mg | EXTENDED_RELEASE_TABLET | Freq: Three times a day (TID) | ORAL | 0 refills | Status: AC | PRN

## 2023-02-15 MED ORDER — METOPROLOL SUCCINATE ER 25 MG PO TB24: 12.5000 mg | ORAL_TABLET | Freq: Every day | ORAL | 0 refills | Status: AC

## 2023-02-15 MED ORDER — CEPHALEXIN 500 MG PO CAPS: 500.0000 mg | ORAL_CAPSULE | Freq: Four times a day (QID) | ORAL | 0 refills | Status: AC

## 2023-02-15 NOTE — Care Management Important Message (Signed)
Important Message  Patient Details  Name: Arrionna Meddaugh MRN: [id number] Date of Birth: 1924/09/13   Important Message Given:  Yes - Medicare IM     Mikaela Hilgeman, Stephan Minister 02/15/2023, 3:17 PM

## 2023-02-15 NOTE — Plan of Care (Signed)
  Problem: Fluid Volume: Goal: Hemodynamic stability will improve Outcome: Progressing   Problem: Clinical Measurements: Goal: Diagnostic test results will improve Outcome: Progressing Goal: Signs and symptoms of infection will decrease Outcome: Progressing   Problem: Respiratory: Goal: Ability to maintain adequate ventilation will improve Outcome: Progressing   Problem: Education: Goal: Knowledge of General Education information will improve Description: Including pain rating scale, medication(s)/side effects and non-pharmacologic comfort measures Outcome: Progressing   Problem: Health Behavior/Discharge Planning: Goal: Ability to manage health-related needs will improve Outcome: Progressing   Problem: Clinical Measurements: Goal: Ability to maintain clinical measurements within normal limits will improve Outcome: Progressing Goal: Will remain free from infection Outcome: Progressing Goal: Respiratory complications will improve Outcome: Progressing Goal: Cardiovascular complication will be avoided Outcome: Progressing   Problem: Activity: Goal: Risk for activity intolerance will decrease Outcome: Progressing   Problem: Nutrition: Goal: Adequate nutrition will be maintained Outcome: Progressing   Problem: Coping: Goal: Level of anxiety will decrease Outcome: Progressing   Problem: Elimination: Goal: Will not experience complications related to urinary retention Outcome: Progressing   Problem: Pain Management: Goal: General experience of comfort will improve Outcome: Progressing   Problem: Safety: Goal: Ability to remain free from injury will improve Outcome: Progressing   Problem: Skin Integrity: Goal: Risk for impaired skin integrity will decrease Outcome: Progressing

## 2023-02-15 NOTE — Discharge Summary (Signed)
Physician Discharge Summary  Kathy Lowery ZOX:[id number] DOB: 01-06-1925 DOA: 02/07/2023  PCP: Kathy Mealing, MD  Admit date: 02/07/2023 Discharge date: 02/15/2023  Admitted From: Kathy Lowery Disposition:  SNF  Recommendations for Outpatient Follow-up:  Follow up with PCP in 1-2 weeks  Home Health: Equipment/Devices:  Discharge Condition: stable  CODE STATUS: full  Diet recommendation: Heart Healthy  Brief/Interim Summary: HPI was taken from Dr. Sedalia Muta:  [name] is a 89 year old female with atrial fibrillation and flutter, quadriplegia functional, on chronic anticoagulation with Eliquis, history of intertrigo, hyperlipidemia, hypertension, depression, anxiety, GERD, who presents to the emergency department from [facility] via EMS for chief concerns of altered mental status.   Vitals in the ED showed temperature of 99.4, respiration rate 19, heart rate of 73, blood pressure 112/71, SpO2 of 93% on room air.   Serum sodium is 129, potassium 5.0, chloride 93, serum creatinine 1.90, eGFR 24, nonfasting blood glucose 103, WBC 19, hemoglobin 14.2, platelets of 145.   UA was positive for moderate leukocytes.  Blood cultures x 2 are in process.  Urine culture is in process.   ED treatment: Ceftriaxone 2 g IV 7-day course were ordered, sodium chloride 1 L bolus, acetaminophen 650 mg p.o. one-time dose ------------------------------ At bedside, patient able to tell me her first and last name, her age correctly, her location of hospital bed, and the current calendar year.   She was not able to tell me why she is in the hospital.  She shakes her head denying being in pain when asked.   I attempted to feed her at bedside, patient chews her food pretty well and then ultimately spits it out shaking her head towards the food.   Per daughter over the phone, facility did not report fever, vomiting, diarrhea.   Daughter reports that patient has had change in mentation since Friday because  she calls and talks to her mother every day.   Daughter further reports that he was noted she has poor p.o. intake for the past several days.   Social history: Patient is from [facility] nursing facility.  She denies tobacco, EtOH, recreational drug use.  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   ROS: Unable to complete as patient has severe hearing difficulties   ED Course: Discussed with EDP, patient requiring hospitalization for chief concerns of severe sepsis.  Discharge Diagnoses:  Principal Problem:   Severe sepsis with acute organ dysfunction (HCC) Active Problems:   Acute hypoxic respiratory failure (HCC)   AKI (acute kidney injury) (HCC)   Acute metabolic encephalopathy   Acute on chronic systolic CHF (congestive heart failure) (HCC)   Thrombocytopenia (HCC)   Obesity (BMI 30-39.9)   Essential hypertension   Atrial fibrillation, chronic (HCC)   Coronary artery disease   Hyperlipidemia, mixed   Quadriplegia, functional (HCC)   PAD (peripheral artery disease) (HCC)   Mild cognitive impairment   Hard of hearing   Metabolic acidosis   Pressure injury of skin   Myocardial injury  Severe sepsis: see Dr. Renea Ee notes on how pt met severe sepsis criteria. Possibly secondary to aspiration pneumonia & bacteremia. Continue on abxs, bronchodilators & encourage incentive spirometry. Sepsis resolved    Likely aspiration pneumonia: completed abx course. Bronchodilators prn. Encourage incentive spirometry    UTI: urine cx is growing e. coli. Initially on IV rocephin and changed to keflex    Bacteremia: blood cultures growing from e. coli. Likely secondary to UTI. Repeat blood cxs  NGTD. Initially on IV rocephin and changed to keflex to complete the course    Acute hypoxic respiratory failure: likely secondary to pneumonia. Continue on abxs.  Continue on supplemental oxygen and wean as tolerated  AKI: resolved    Hyponatremia: etiology  unclear, ? SIADH. Labile.    HTN: restarted aldactone, lasix. D/c propranolol and started metoprolol   Elevated troponin: likely secondary to demand ischemia    Metabolic acidosis: resolved    Hard of hearing: chronic. Continue w/ supportive care   Encephalopathy: likely secondary to above infections, pneumonia & bacteremia. Mental status waxes and wanes   Quadriplegia: functional. Continue w/ supportive care    Likely PAF: continue on eliquis and start metoprolol. D/c propranolol    Obesity: BMI 30.9. Would benefit from weight loss      Discharge Instructions  Discharge Instructions     Diet - low sodium heart healthy   Complete by: As directed    Discharge instructions   Complete by: As directed    F/u w/ PCP in 1-2 weeks   Increase activity slowly   Complete by: As directed    No wound care   Complete by: As directed       Allergies as of 02/15/2023   No Known Allergies      Medication List     STOP taking these medications    propranolol 20 MG tablet Commonly known as: INDERAL       TAKE these medications    acetaminophen 650 MG CR tablet Commonly known as: Tylenol 8 Hour Take 1 tablet (650 mg total) by mouth every 8 (eight) hours as needed for up to 7 days for pain or fever.   apixaban 5 MG Tabs tablet Commonly known as: Eliquis Take 1 tablet (5 mg total) by mouth 2 (two) times daily.   atorvastatin 40 MG tablet Commonly known as: LIPITOR Take 1 tablet (40 mg total) by mouth daily at 6 PM.   cephALEXin 500 MG capsule Commonly known as: KEFLEX Take 1 capsule (500 mg total) by mouth every 6 (six) hours for 7 days.   clotrimazole-betamethasone cream Commonly known as: LOTRISONE Apply 1 Application topically daily.   ferrous gluconate 324 MG tablet Commonly known as: FERGON Take 324 mg by mouth daily with breakfast.   furosemide 20 MG tablet Commonly known as: LASIX Take 20 mg by mouth daily.   gemfibrozil 600 MG tablet Commonly known  as: LOPID Take 600 mg by mouth 2 (two) times daily before a meal.   metoprolol succinate 25 MG 24 hr tablet Commonly known as: TOPROL-XL Take 0.5 tablets (12.5 mg total) by mouth daily. Start taking on: February 16, 2023   omeprazole 40 MG capsule Commonly known as: PRILOSEC Take 40 mg by mouth daily.   potassium chloride 10 MEQ tablet Commonly known as: KLOR-CON M Take 10 mEq by mouth daily.   sertraline 25 MG tablet Commonly known as: ZOLOFT Take 25 mg by mouth daily.   spironolactone 25 MG tablet Commonly known as: ALDACTONE Take 1 tablet (25 mg total) by mouth daily.        No Known Allergies  Consultations:    Procedures/Studies: DG Chest Port 1 View Result Date: 02/07/2023 CLINICAL DATA:  Cough, short of breath EXAM: PORTABLE CHEST 1 VIEW COMPARISON:  08/21/2016 FINDINGS: Single frontal view of the chest demonstrates stable enlargement of the cardiac silhouette. Moderate hiatal hernia. There is patchy bilateral airspace disease, most pronounced in the right upper lobe abutting the  minor fissure. Atelectatic changes are again seen at the lung bases. No effusion or pneumothorax. Chronic elevation of the right hemidiaphragm. No acute bony abnormalities. IMPRESSION: 1. Patchy bilateral perihilar airspace disease most pronounced in the right upper lobe, compatible with bronchopneumonia. 2. Hiatal hernia. Electronically Signed   By: Sharlet Salina M.D.   On: 02/07/2023 20:45   CT ABDOMEN PELVIS WO CONTRAST Result Date: 02/07/2023 CLINICAL DATA:  UTI, recurrent/complicated (Female). Altered mental status. EXAM: CT ABDOMEN AND PELVIS WITHOUT CONTRAST TECHNIQUE: Multidetector CT imaging of the abdomen and pelvis was performed following the standard protocol without IV contrast. RADIATION DOSE REDUCTION: This exam was performed according to the departmental dose-optimization program which includes automated exposure control, adjustment of the mA and/or kV according to patient  size and/or use of iterative reconstruction technique. COMPARISON:  None Available. FINDINGS: Lower chest: There are patchy atelectatic changes in the visualized lung bases. No overt consolidation. No pleural effusion. The heart is normal in size. No pericardial effusion. There is a slightly lobulated 1.4 x 2.1 cm soft tissue attenuation nodule in the left breast, which is incompletely characterized on the current exam. Correlate clinically to determine the need for additional imaging. Hepatobiliary: The liver is normal in size. Non-cirrhotic configuration. No suspicious mass. No intrahepatic or extrahepatic bile duct dilation. Small volume dependent peripherally rim calcified gallstones noted without imaging signs of acute cholecystitis. Normal gallbladder wall thickness. No pericholecystic inflammatory changes. Pancreas: Unremarkable. No pancreatic ductal dilatation or surrounding inflammatory changes. Spleen: Within normal limits. No focal lesion. Adrenals/Urinary Tract: Adrenal glands are unremarkable. Evaluation of bilateral kidneys is limited due to lack of intravenous contrast. There is a partially exophytic cyst arising from the right kidney interpolar region, anteromedially measuring 2.5 x 3.1 cm. There are additional smaller cysts in bilateral kidneys. There are at least 2, sub 4 mm nonobstructing calculi in the right kidney and a single punctate nonobstructing calculus in the left kidney lower pole. No ureterolithiasis on either side. No hydroureteronephrosis. Urinary bladder is under distended, precluding optimal assessment. However, no large mass or stones identified. No perivesical fat stranding. Stomach/Bowel: Small-to-moderate hiatal hernia noted. No disproportionate dilation of the small or large bowel loops. No evidence of abnormal bowel wall thickening or inflammatory changes. The appendix was not visualized; however there is no acute inflammatory process in the right lower quadrant. There are  innumerable diverticula throughout the colon, without imaging signs of diverticulitis. Vascular/Lymphatic: No ascites or pneumoperitoneum. No abdominal or pelvic lymphadenopathy, by size criteria. No aneurysmal dilation of the major abdominal arteries. There are moderate peripheral atherosclerotic vascular calcifications of the aorta and its major branches. Reproductive: The uterus is surgically absent. No large adnexal mass. Other: There is a tiny fat containing umbilical hernia. The soft tissues and abdominal wall are otherwise unremarkable. Musculoskeletal: No suspicious osseous lesions. There are moderate multilevel degenerative changes in the visualized spine. T9 and L1 vertebral body hemangiomas noted. Patient is status post bilateral hip arthroplasty. IMPRESSION: 1. No acute inflammatory process identified within the abdomen or pelvis. 2. There are bilateral sub 4 mm nonobstructing renal calculi. No ureterolithiasis or obstructive uropathy on either side. 3. There is a slightly lobulated 1.4 x 2.1 cm soft tissue attenuation nodule in the left breast, incompletely characterized on the current exam. Correlate clinically to determine the need for additional imaging. 4. Multiple other nonacute observations, as described above. Electronically Signed   By: Jules Schick M.D.   On: 02/07/2023 16:05   CT Head Wo Contrast Result Date: 02/07/2023  CLINICAL DATA:  Provided history: Mental status change, unknown cause. EXAM: CT HEAD WITHOUT CONTRAST TECHNIQUE: Contiguous axial images were obtained from the base of the skull through the vertex without intravenous contrast. RADIATION DOSE REDUCTION: This exam was performed according to the departmental dose-optimization program which includes automated exposure control, adjustment of the mA and/or kV according to patient size and/or use of iterative reconstruction technique. COMPARISON:  Brain MRI 08/21/2016.  Head CT 08/21/2016. FINDINGS: Brain: Generalized cerebral  atrophy. Patchy and ill-defined hypoattenuation within the cerebral white matter, nonspecific but compatible with mild chronic small vessel ischemic disease. Prominent perivascular space within the inferior left basal ganglia. Partially empty sella turcica. There is no acute intracranial hemorrhage. No demarcated cortical infarct. No extra-axial fluid collection. No evidence of an intracranial mass. No midline shift. Vascular: No hyperdense vessel.  Atherosclerotic calcifications. Skull: No calvarial fracture or aggressive osseous lesion. Sinuses/Orbits: No mass or acute finding within the imaged orbits. No significant paranasal sinus disease at imaged levels. IMPRESSION: 1. No evidence of an acute intracranial abnormality. 2. Parenchymal atrophy and chronic small vessel ischemic disease, as described. Electronically Signed   By: Jackey Loge D.O.   On: 02/07/2023 15:03   (Echo, Carotid, EGD, Colonoscopy, ERCP)    Subjective: Pt c/o fatigue   Discharge Exam: Vitals:   02/15/23 0323 02/15/23 1140  BP: (!) 143/69 127/69  Pulse: 65 65  Resp: (!) 22 17  Temp: 97.8 F (36.6 C) 97.6 F (36.4 C)  SpO2: 95% 96%   Vitals:   02/14/23 1931 02/14/23 2315 02/15/23 0323 02/15/23 1140  BP: 125/87 (!) 127/57 (!) 143/69 127/69  Pulse: 62 66 65 65  Resp: 18 (!) 24 (!) 22 17  Temp: 97.7 F (36.5 C) 97.9 F (36.6 C) 97.8 F (36.6 C) 97.6 F (36.4 C)  TempSrc: Oral Oral Oral   SpO2: 92% 97% 95% 96%  Weight:      Height:        General: Pt is alert, awake, not in acute distress Cardiovascular: S1/S2 +, no rubs, no gallops Respiratory: decreased breath sounds b/l  Abdominal: Soft, NT, obese, bowel sounds + Extremities:  no cyanosis    The results of significant diagnostics from this hospitalization (including imaging, microbiology, ancillary and laboratory) are listed below for reference.     Microbiology: Recent Results (from the past 240 hours)  Blood culture (routine x 2)     Status:  Abnormal   Collection Time: 02/07/23  1:20 PM   Specimen: BLOOD  Result Value Ref Range Status   Specimen Description   Final    BLOOD RIGTH ARM Performed at [facility], [address], [place], [place] [address]    Special Requests   Final    BOTTLES DRAWN AEROBIC AND ANAEROBIC Blood Culture results may not be optimal due to an inadequate volume of blood received in culture bottles Performed at [facility], [address] [place], [place] [address]    Culture  Setup Time   Final    Organism ID to follow GRAM NEGATIVE RODS IN BOTH AEROBIC AND ANAEROBIC BOTTLES CRITICAL RESULT CALLED TO, READ BACK BY AND VERIFIED WITH: NATHAN BELUE PHARMD @0039  02/08/23 ASW    Culture ESCHERICHIA COLI (A)  Final   Report Status 02/10/2023 FINAL  Final   Organism ID, Bacteria ESCHERICHIA COLI  Final   Organism ID, Bacteria ESCHERICHIA COLI  Final      Susceptibility   Escherichia coli - KIRBY BAUER*    CEFAZOLIN  SENSITIVE Sensitive    Escherichia coli - MIC*    AMPICILLIN >=32 RESISTANT Resistant     CEFEPIME <=0.12 SENSITIVE Sensitive     CEFTAZIDIME <=1 SENSITIVE Sensitive     CEFTRIAXONE <=0.25 SENSITIVE Sensitive     CIPROFLOXACIN <=0.25 SENSITIVE Sensitive     GENTAMICIN <=1 SENSITIVE Sensitive     IMIPENEM <=0.25 SENSITIVE Sensitive     TRIMETH/SULFA <=20 SENSITIVE Sensitive     AMPICILLIN/SULBACTAM 16 INTERMEDIATE Intermediate     PIP/TAZO <=4 SENSITIVE Sensitive ug/mL    * ESCHERICHIA COLI    ESCHERICHIA COLI  Blood culture (routine x 2)     Status: Abnormal   Collection Time: 02/07/23  1:20 PM   Specimen: BLOOD  Result Value Ref Range Status   Specimen Description   Final    BLOOD LEFT ARM Performed at [facility], [address] [place], [place] [address]    Special Requests   Final    BOTTLES DRAWN AEROBIC AND ANAEROBIC Blood Culture results may not be optimal due to an inadequate volume of blood received in culture bottles Performed at  [facility], [address] [place], [place] [address]    Culture  Setup Time   Final    GRAM NEGATIVE RODS IN BOTH AEROBIC AND ANAEROBIC BOTTLES CRITICAL VALUE NOTED.  VALUE IS CONSISTENT WITH PREVIOUSLY REPORTED AND CALLED VALUE. Performed at [facility], [address] [place], [place] [address]    Culture (A)  Final    ESCHERICHIA COLI SUSCEPTIBILITIES PERFORMED ON PREVIOUS CULTURE WITHIN THE LAST 5 DAYS. Performed at [facility], [address], [place], [place] [address]    Report Status 02/10/2023 FINAL  Final  Blood Culture ID Panel (Reflexed)     Status: Abnormal   Collection Time: 02/07/23  1:20 PM  Result Value Ref Range Status   Enterococcus faecalis NOT DETECTED NOT DETECTED Final   Enterococcus Faecium NOT DETECTED NOT DETECTED Final   Listeria monocytogenes NOT DETECTED NOT DETECTED Final   Staphylococcus species NOT DETECTED NOT DETECTED Final   Staphylococcus aureus (BCID) NOT DETECTED NOT DETECTED Final   Staphylococcus epidermidis NOT DETECTED NOT DETECTED Final   Staphylococcus lugdunensis NOT DETECTED NOT DETECTED Final   Streptococcus species NOT DETECTED NOT DETECTED Final   Streptococcus agalactiae NOT DETECTED NOT DETECTED Final   Streptococcus pneumoniae NOT DETECTED NOT DETECTED Final   Streptococcus pyogenes NOT DETECTED NOT DETECTED Final   A.calcoaceticus-baumannii NOT DETECTED NOT DETECTED Final   Bacteroides fragilis NOT DETECTED NOT DETECTED Final   Enterobacterales DETECTED (A) NOT DETECTED Final    Comment: Enterobacterales represent a large order of gram negative bacteria, not a single organism. CRITICAL RESULT CALLED TO, READ BACK BY AND VERIFIED WITH: NATHAN BELUE PHARMD @0039  02/08/23 ASW    Enterobacter cloacae complex NOT DETECTED NOT DETECTED Final   Escherichia coli DETECTED (A) NOT DETECTED Final    Comment: CRITICAL RESULT CALLED TO, READ BACK BY AND VERIFIED WITH: NATHAN BELUE PHARMD @0039  02/08/23  ASW    Klebsiella aerogenes NOT DETECTED NOT DETECTED Final   Klebsiella oxytoca NOT DETECTED NOT DETECTED Final   Klebsiella pneumoniae NOT DETECTED NOT DETECTED Final   Proteus species NOT DETECTED NOT DETECTED Final   Salmonella species NOT DETECTED NOT DETECTED Final   Serratia marcescens NOT DETECTED NOT DETECTED Final   Haemophilus influenzae NOT DETECTED NOT DETECTED Final   Neisseria meningitidis NOT DETECTED NOT DETECTED Final   Pseudomonas aeruginosa NOT DETECTED NOT DETECTED Final  Stenotrophomonas maltophilia NOT DETECTED NOT DETECTED Final   Candida albicans NOT DETECTED NOT DETECTED Final   Candida auris NOT DETECTED NOT DETECTED Final   Candida glabrata NOT DETECTED NOT DETECTED Final   Candida krusei NOT DETECTED NOT DETECTED Final   Candida parapsilosis NOT DETECTED NOT DETECTED Final   Candida tropicalis NOT DETECTED NOT DETECTED Final   Cryptococcus neoformans/gattii NOT DETECTED NOT DETECTED Final   CTX-M ESBL NOT DETECTED NOT DETECTED Final   Carbapenem resistance IMP NOT DETECTED NOT DETECTED Final   Carbapenem resistance KPC NOT DETECTED NOT DETECTED Final   Carbapenem resistance NDM NOT DETECTED NOT DETECTED Final   Carbapenem resist OXA 48 LIKE NOT DETECTED NOT DETECTED Final   Carbapenem resistance VIM NOT DETECTED NOT DETECTED Final    Comment: Performed at [facility], [address] [place], [place] [address]  Urine Culture     Status: Abnormal   Collection Time: 02/07/23  1:21 PM   Specimen: Urine, Catheterized  Result Value Ref Range Status   Specimen Description   Final    URINE, CATHETERIZED Performed at [facility], [address], [place], [place] [address]    Special Requests   Final    NONE Reflexed from [id number] Performed at [facility], [address] [place], [place] [address]    Culture >=100,000 COLONIES/mL ESCHERICHIA COLI (A)  Final   Report Status 02/10/2023 FINAL  Final   Organism ID, Bacteria  ESCHERICHIA COLI (A)  Final      Susceptibility   Escherichia coli - MIC*    AMPICILLIN >=32 RESISTANT Resistant     CEFAZOLIN <=4 SENSITIVE Sensitive     CEFEPIME <=0.12 SENSITIVE Sensitive     CEFTRIAXONE <=0.25 SENSITIVE Sensitive     CIPROFLOXACIN <=0.25 SENSITIVE Sensitive     GENTAMICIN <=1 SENSITIVE Sensitive     IMIPENEM <=0.25 SENSITIVE Sensitive     NITROFURANTOIN <=16 SENSITIVE Sensitive     TRIMETH/SULFA <=20 SENSITIVE Sensitive     AMPICILLIN/SULBACTAM 16 INTERMEDIATE Intermediate     PIP/TAZO <=4 SENSITIVE Sensitive ug/mL    * >=100,000 COLONIES/mL ESCHERICHIA COLI  Surgical pcr screen     Status: None   Collection Time: 02/08/23  3:20 AM   Specimen: Nasal Mucosa; Nasal Swab  Result Value Ref Range Status   MRSA, PCR NEGATIVE NEGATIVE Final   Staphylococcus aureus NEGATIVE NEGATIVE Final    Comment: (NOTE) The Xpert SA Assay (FDA approved for NASAL specimens in patients 67 years of age and older), is one component of a comprehensive surveillance program. It is not intended to diagnose infection nor to guide or monitor treatment. Performed at [facility], [address] [place], [place] [address]   Culture, blood (Routine X 2) w Reflex to ID Panel     Status: None   Collection Time: 02/10/23  4:39 PM   Specimen: BLOOD  Result Value Ref Range Status   Specimen Description BLOOD BLOOD RIGHT ARM  Final   Special Requests   Final    BOTTLES DRAWN AEROBIC AND ANAEROBIC Blood Culture results may not be optimal due to an inadequate volume of blood received in culture bottles   Culture   Final    NO GROWTH 5 DAYS Performed at [facility], [address], [place], [place] [address]    Report Status 02/15/2023 FINAL  Final  Culture, blood (Routine X 2) w Reflex to ID Panel     Status: None   Collection Time: 02/10/23  4:41 PM   Specimen: BLOOD  Result Value Ref Range Status   Specimen Description BLOOD BLOOD RIGHT HAND  Final   Special Requests    Final    BOTTLES DRAWN AEROBIC AND ANAEROBIC Blood Culture results may not be optimal due to an inadequate volume of blood received in culture bottles   Culture   Final    NO GROWTH 5 DAYS Performed at [facility], [address], [place], [place] [address]    Report Status 02/15/2023 FINAL  Final     Labs: BNP (last 3 results) No results for input(s): "BNP" in the last 8760 hours. Basic Metabolic Panel: Recent Labs  Lab 02/10/23 0640 02/11/23 0513 02/12/23 0345 02/14/23 0558 02/15/23 0601  NA 129* 134* 136 135 134*  K 3.3* 3.6 3.4* 3.6 4.0  CL 102 108 105 104 103  CO2 20* 20* 22 25 25   GLUCOSE 76 96 132* 95 85  BUN 51* 42* 37* 25* 26*  CREATININE 0.95 0.82 0.72 0.67 0.68  CALCIUM 8.5* 8.5* 8.2* 8.2* 8.4*   Liver Function Tests: Recent Labs  Lab 02/10/23 0640 02/11/23 0513  AST 16 15  ALT 9 13  ALKPHOS 103 102  BILITOT 1.0 0.7  PROT 6.0* 5.4*  ALBUMIN 2.3* 2.1*   No results for input(s): "LIPASE", "AMYLASE" in the last 168 hours. No results for input(s): "AMMONIA" in the last 168 hours. CBC: Recent Labs  Lab 02/10/23 0640 02/11/23 0513 02/12/23 0345  WBC 11.3* 10.7* 11.3*  HGB 13.0 11.8* 12.0  HCT 37.7 34.5* 35.7*  MCV 90.6 89.8 93.7  PLT 146* 169 207   Cardiac Enzymes: No results for input(s): "CKTOTAL", "CKMB", "CKMBINDEX", "TROPONINI" in the last 168 hours. BNP: Invalid input(s): "POCBNP" CBG: No results for input(s): "GLUCAP" in the last 168 hours. D-Dimer No results for input(s): "DDIMER" in the last 72 hours. Hgb A1c No results for input(s): "HGBA1C" in the last 72 hours. Lipid Profile No results for input(s): "CHOL", "HDL", "LDLCALC", "TRIG", "CHOLHDL", "LDLDIRECT" in the last 72 hours. Thyroid function studies No results for input(s): "TSH", "T4TOTAL", "T3FREE", "THYROIDAB" in the last 72 hours.  Invalid input(s): "FREET3" Anemia work up No results for input(s): "VITAMINB12", "FOLATE", "FERRITIN", "TIBC", "IRON", "RETICCTPCT"  in the last 72 hours. Urinalysis    Component Value Date/Time   COLORURINE YELLOW (A) 02/07/2023 1321   APPEARANCEUR TURBID (A) 02/07/2023 1321   LABSPEC 1.012 02/07/2023 1321   PHURINE 5.0 02/07/2023 1321   GLUCOSEU NEGATIVE 02/07/2023 1321   HGBUR LARGE (A) 02/07/2023 1321   BILIRUBINUR NEGATIVE 02/07/2023 1321   KETONESUR NEGATIVE 02/07/2023 1321   PROTEINUR 100 (A) 02/07/2023 1321   NITRITE NEGATIVE 02/07/2023 1321   LEUKOCYTESUR MODERATE (A) 02/07/2023 1321   Sepsis Labs Recent Labs  Lab 02/10/23 0640 02/11/23 0513 02/12/23 0345  WBC 11.3* 10.7* 11.3*   Microbiology Recent Results (from the past 240 hours)  Blood culture (routine x 2)     Status: Abnormal   Collection Time: 02/07/23  1:20 PM   Specimen: BLOOD  Result Value Ref Range Status   Specimen Description   Final    BLOOD RIGTH ARM Performed at [place] [facility], [address], [place], [place] [address]    Special Requests   Final    BOTTLES DRAWN AEROBIC AND ANAEROBIC Blood Culture results may not be optimal due to an inadequate volume of blood received in culture bottles Performed at [facility], [address], [place], [place] [address]  Culture  Setup Time   Final    Organism ID to follow GRAM NEGATIVE RODS IN BOTH AEROBIC AND ANAEROBIC BOTTLES CRITICAL RESULT CALLED TO, READ BACK BY AND VERIFIED WITH: NATHAN BELUE PHARMD @0039  02/08/23 ASW    Culture ESCHERICHIA COLI (A)  Final   Report Status 02/10/2023 FINAL  Final   Organism ID, Bacteria ESCHERICHIA COLI  Final   Organism ID, Bacteria ESCHERICHIA COLI  Final      Susceptibility   Escherichia coli - KIRBY BAUER*    CEFAZOLIN SENSITIVE Sensitive    Escherichia coli - MIC*    AMPICILLIN >=32 RESISTANT Resistant     CEFEPIME <=0.12 SENSITIVE Sensitive     CEFTAZIDIME <=1 SENSITIVE Sensitive     CEFTRIAXONE <=0.25 SENSITIVE Sensitive     CIPROFLOXACIN <=0.25 SENSITIVE Sensitive     GENTAMICIN <=1 SENSITIVE Sensitive      IMIPENEM <=0.25 SENSITIVE Sensitive     TRIMETH/SULFA <=20 SENSITIVE Sensitive     AMPICILLIN/SULBACTAM 16 INTERMEDIATE Intermediate     PIP/TAZO <=4 SENSITIVE Sensitive ug/mL    * ESCHERICHIA COLI    ESCHERICHIA COLI  Blood culture (routine x 2)     Status: Abnormal   Collection Time: 02/07/23  1:20 PM   Specimen: BLOOD  Result Value Ref Range Status   Specimen Description   Final    BLOOD LEFT ARM Performed at [facility], [address] [place], [place] [address]    Special Requests   Final    BOTTLES DRAWN AEROBIC AND ANAEROBIC Blood Culture results may not be optimal due to an inadequate volume of blood received in culture bottles Performed at [facility], [address] [place], [place] [address]    Culture  Setup Time   Final    GRAM NEGATIVE RODS IN BOTH AEROBIC AND ANAEROBIC BOTTLES CRITICAL VALUE NOTED.  VALUE IS CONSISTENT WITH PREVIOUSLY REPORTED AND CALLED VALUE. Performed at [facility], [address] [place], [place] [address]    Culture (A)  Final    ESCHERICHIA COLI SUSCEPTIBILITIES PERFORMED ON PREVIOUS CULTURE WITHIN THE LAST 5 DAYS. Performed at [facility], [address], [place], [place] [address]    Report Status 02/10/2023 FINAL  Final  Blood Culture ID Panel (Reflexed)     Status: Abnormal   Collection Time: 02/07/23  1:20 PM  Result Value Ref Range Status   Enterococcus faecalis NOT DETECTED NOT DETECTED Final   Enterococcus Faecium NOT DETECTED NOT DETECTED Final   Listeria monocytogenes NOT DETECTED NOT DETECTED Final   Staphylococcus species NOT DETECTED NOT DETECTED Final   Staphylococcus aureus (BCID) NOT DETECTED NOT DETECTED Final   Staphylococcus epidermidis NOT DETECTED NOT DETECTED Final   Staphylococcus lugdunensis NOT DETECTED NOT DETECTED Final   Streptococcus species NOT DETECTED NOT DETECTED Final   Streptococcus agalactiae NOT DETECTED NOT DETECTED Final   Streptococcus pneumoniae NOT  DETECTED NOT DETECTED Final   Streptococcus pyogenes NOT DETECTED NOT DETECTED Final   A.calcoaceticus-baumannii NOT DETECTED NOT DETECTED Final   Bacteroides fragilis NOT DETECTED NOT DETECTED Final   Enterobacterales DETECTED (A) NOT DETECTED Final    Comment: Enterobacterales represent a large order of gram negative bacteria, not a single organism. CRITICAL RESULT CALLED TO, READ BACK BY AND VERIFIED WITH: NATHAN BELUE PHARMD @0039  02/08/23 ASW    Enterobacter cloacae complex NOT DETECTED NOT DETECTED Final   Escherichia coli DETECTED (A) NOT DETECTED Final    Comment: CRITICAL RESULT CALLED TO, READ BACK BY  AND VERIFIED WITH: NATHAN BELUE PHARMD @0039  02/08/23 ASW    Klebsiella aerogenes NOT DETECTED NOT DETECTED Final   Klebsiella oxytoca NOT DETECTED NOT DETECTED Final   Klebsiella pneumoniae NOT DETECTED NOT DETECTED Final   Proteus species NOT DETECTED NOT DETECTED Final   Salmonella species NOT DETECTED NOT DETECTED Final   Serratia marcescens NOT DETECTED NOT DETECTED Final   Haemophilus influenzae NOT DETECTED NOT DETECTED Final   Neisseria meningitidis NOT DETECTED NOT DETECTED Final   Pseudomonas aeruginosa NOT DETECTED NOT DETECTED Final   Stenotrophomonas maltophilia NOT DETECTED NOT DETECTED Final   Candida albicans NOT DETECTED NOT DETECTED Final   Candida auris NOT DETECTED NOT DETECTED Final   Candida glabrata NOT DETECTED NOT DETECTED Final   Candida krusei NOT DETECTED NOT DETECTED Final   Candida parapsilosis NOT DETECTED NOT DETECTED Final   Candida tropicalis NOT DETECTED NOT DETECTED Final   Cryptococcus neoformans/gattii NOT DETECTED NOT DETECTED Final   CTX-M ESBL NOT DETECTED NOT DETECTED Final   Carbapenem resistance IMP NOT DETECTED NOT DETECTED Final   Carbapenem resistance KPC NOT DETECTED NOT DETECTED Final   Carbapenem resistance NDM NOT DETECTED NOT DETECTED Final   Carbapenem resist OXA 48 LIKE NOT DETECTED NOT DETECTED Final   Carbapenem  resistance VIM NOT DETECTED NOT DETECTED Final    Comment: Performed at [facility], [address] [place], [place] [address]  Urine Culture     Status: Abnormal   Collection Time: 02/07/23  1:21 PM   Specimen: Urine, Catheterized  Result Value Ref Range Status   Specimen Description   Final    URINE, CATHETERIZED Performed at [facility], [address], [place], [place] [address]    Special Requests   Final    NONE Reflexed from [id number] Performed at [facility], [address] [place], [place] [address]    Culture >=100,000 COLONIES/mL ESCHERICHIA COLI (A)  Final   Report Status 02/10/2023 FINAL  Final   Organism ID, Bacteria ESCHERICHIA COLI (A)  Final      Susceptibility   Escherichia coli - MIC*    AMPICILLIN >=32 RESISTANT Resistant     CEFAZOLIN <=4 SENSITIVE Sensitive     CEFEPIME <=0.12 SENSITIVE Sensitive     CEFTRIAXONE <=0.25 SENSITIVE Sensitive     CIPROFLOXACIN <=0.25 SENSITIVE Sensitive     GENTAMICIN <=1 SENSITIVE Sensitive     IMIPENEM <=0.25 SENSITIVE Sensitive     NITROFURANTOIN <=16 SENSITIVE Sensitive     TRIMETH/SULFA <=20 SENSITIVE Sensitive     AMPICILLIN/SULBACTAM 16 INTERMEDIATE Intermediate     PIP/TAZO <=4 SENSITIVE Sensitive ug/mL    * >=100,000 COLONIES/mL ESCHERICHIA COLI  Surgical pcr screen     Status: None   Collection Time: 02/08/23  3:20 AM   Specimen: Nasal Mucosa; Nasal Swab  Result Value Ref Range Status   MRSA, PCR NEGATIVE NEGATIVE Final   Staphylococcus aureus NEGATIVE NEGATIVE Final    Comment: (NOTE) The Xpert SA Assay (FDA approved for NASAL specimens in patients 4 years of age and older), is one component of a comprehensive surveillance program. It is not intended to diagnose infection nor to guide or monitor treatment. Performed at [facility], [address] [place], [place] [address]   Culture, blood (Routine X 2) w Reflex to ID Panel     Status: None   Collection Time: 02/10/23   4:39 PM   Specimen: BLOOD  Result Value Ref Range Status   Specimen Description BLOOD  BLOOD RIGHT ARM  Final   Special Requests   Final    BOTTLES DRAWN AEROBIC AND ANAEROBIC Blood Culture results may not be optimal due to an inadequate volume of blood received in culture bottles   Culture   Final    NO GROWTH 5 DAYS Performed at [facility], [address] [place], [place] [address]    Report Status 02/15/2023 FINAL  Final  Culture, blood (Routine X 2) w Reflex to ID Panel     Status: None   Collection Time: 02/10/23  4:41 PM   Specimen: BLOOD  Result Value Ref Range Status   Specimen Description BLOOD BLOOD RIGHT HAND  Final   Special Requests   Final    BOTTLES DRAWN AEROBIC AND ANAEROBIC Blood Culture results may not be optimal due to an inadequate volume of blood received in culture bottles   Culture   Final    NO GROWTH 5 DAYS Performed at [facility], [address], [place], [place] [address]    Report Status 02/15/2023 FINAL  Final     Time coordinating discharge: Over 30 minutes  SIGNED:   Charise Killian, MD  [facility] 02/15/2023, 1:10 PM Pager   If 7PM-7AM, please contact night-coverage [email or web address]

## 2023-02-15 NOTE — TOC Transition Note (Addendum)
Transition of Care [facility]) - Discharge Note   Patient Details  Name: Kathy Lowery MRN: [id number] Date of Birth: 1925/01/29  Transition of Care [facility]) CM/SW Contact:  Truddie Hidden, RN Phone Number: 02/15/2023, 1:37 PM   Clinical Narrative:    Sherron Monday with Sue Lush in admissions at [facility] Per facility patient admission confirmed for today. Patient assigned room # 101 Nurse will call report to [phone] Face sheet and medical necessity forms printed to the floor to be added to the EMS pack EMS arranged  Discharge summary and SNF transfer report sent in HUB.  Nurse, and family notified spoke with her daughter, [name]. She inquired about ordering bed pads. She was advised to follow up with the admissions director at [facility].  TOC signing off.          Patient Goals and CMS Choice            Discharge Placement                       Discharge Plan and Services Additional resources added to the After Visit Summary for                                       Social Drivers of Health (SDOH) Interventions SDOH Screenings   Food Insecurity: Patient Unable To Answer (02/08/2023)  Housing: Patient Unable To Answer (02/08/2023)  Transportation Needs: Patient Unable To Answer (02/08/2023)  Utilities: Not At Risk (02/08/2023)  Depression (PHQ2-9): Low Risk  (12/07/2022)  Financial Resource Strain: Low Risk  (05/31/2022)   Received from [facility]  Tobacco Use: Medium Risk (02/07/2023)     Readmission Risk Interventions     No data to display

## 2023-02-16 LAB — CBC: RBC: 4.06 (ref 3.87–5.11)

## 2023-02-16 LAB — BASIC METABOLIC PANEL
BUN: 22 — AB (ref 4–21)
CO2: 26 — AB (ref 13–22)
Chloride: 103 (ref 99–108)
Creatinine: 0.6 (ref 0.5–1.1)
Glucose: 79
Potassium: 4.2 meq/L (ref 3.5–5.1)
Sodium: 137 (ref 137–147)

## 2023-02-16 LAB — CBC AND DIFFERENTIAL
HCT: 39 (ref 36–46)
Hemoglobin: 12.4 (ref 12.0–16.0)
Neutrophils Absolute: 7438
Platelets: 288 10*3/uL (ref 150–400)
WBC: 9.8

## 2023-02-16 LAB — COMPREHENSIVE METABOLIC PANEL
Calcium: 7.9 — AB (ref 8.7–10.7)
eGFR: 83

## 2023-02-17 ENCOUNTER — Encounter: Payer: Self-pay | Admitting: Student

## 2023-02-17 ENCOUNTER — Non-Acute Institutional Stay (SKILLED_NURSING_FACILITY): Payer: Medicare Other | Admitting: Student

## 2023-02-17 DIAGNOSIS — I4891 Unspecified atrial fibrillation: Secondary | ICD-10-CM

## 2023-02-17 DIAGNOSIS — R482 Apraxia: Secondary | ICD-10-CM

## 2023-02-17 DIAGNOSIS — B351 Tinea unguium: Secondary | ICD-10-CM

## 2023-02-17 DIAGNOSIS — H9190 Unspecified hearing loss, unspecified ear: Secondary | ICD-10-CM

## 2023-02-17 DIAGNOSIS — Z7901 Long term (current) use of anticoagulants: Secondary | ICD-10-CM | POA: Diagnosis not present

## 2023-02-17 DIAGNOSIS — I739 Peripheral vascular disease, unspecified: Secondary | ICD-10-CM

## 2023-02-17 DIAGNOSIS — I4892 Unspecified atrial flutter: Secondary | ICD-10-CM

## 2023-02-17 DIAGNOSIS — I509 Heart failure, unspecified: Secondary | ICD-10-CM | POA: Diagnosis not present

## 2023-02-17 DIAGNOSIS — F03B0 Unspecified dementia, moderate, without behavioral disturbance, psychotic disturbance, mood disturbance, and anxiety: Secondary | ICD-10-CM

## 2023-02-17 DIAGNOSIS — R532 Functional quadriplegia: Secondary | ICD-10-CM | POA: Diagnosis not present

## 2023-02-17 DIAGNOSIS — I10 Essential (primary) hypertension: Secondary | ICD-10-CM

## 2023-02-17 DIAGNOSIS — I872 Venous insufficiency (chronic) (peripheral): Secondary | ICD-10-CM

## 2023-02-17 NOTE — Progress Notes (Unsigned)
Provider:  Dr. Earnestine Mealing Location:  Other [facility].  Nursing Home Room Number: [facility] [address]A Place of Service:  SNF (31)  PCP: Earnestine Mealing, MD Patient Care Team: Earnestine Mealing, MD as PCP - General (Family Medicine) Delma Freeze, FNP as Nurse Practitioner (Family Medicine) Iran Ouch, MD as Consulting Physician (Cardiology)  Extended Emergency Contact Information Primary Emergency Contact: Meda Coffee [place] Home Phone: [phone] Mobile Phone: [phone] Relation: Daughter  Code Status: DNR Goals of Care: Advanced Directive information    02/17/2023    8:53 AM  Advanced Directives  Does Patient Have a Medical Advance Directive? Yes  Type of Advance Directive Out of facility DNR (pink MOST or yellow form)  Does patient want to make changes to medical advance directive? No - Patient declined      Chief Complaint  Patient presents with   New Admit To SNF    Admission.     HPI: Patient is a 89 y.o. female seen today for admission to [facility].   Past Medical History:  Diagnosis Date   Arthritis    CHF (congestive heart failure) (HCC)    Chronic atrial fibrillation (HCC)    Coronary artery disease    Non-ST elevation myocardial infarction in February 2017. Cardiac catheterization showed significant two-vessel coronary artery disease affecting the right coronary artery and left circumflex with no significant disease involving the LAD. Ejection fraction was 30-35% with akinesis of the mid to distal and apical segments consistent with stress-induced cardiomyopathy.   HTN (hypertension)    Hyperlipidemia    MI (myocardial infarction) [facility])    Peripheral neuralgia    Past Surgical History:  Procedure Laterality Date   APPENDECTOMY     CARDIAC CATHETERIZATION N/A 04/23/2015   Procedure: Left Heart Cath and Coronary Angiography;  Surgeon: Iran Ouch, MD;  Location: [facility] INVASIVE CV LAB;  Service:  Cardiovascular;  Laterality: N/A;   LUMBAR SPINE SURGERY      reports that she has quit smoking. Her smoking use included cigarettes. She has never used smokeless tobacco. She reports that she does not drink alcohol and does not use drugs. Social History   Socioeconomic History   Marital status: Widowed    Spouse name: Not on file   Number of children: Not on file   Years of education: Not on file   Highest education level: Not on file  Occupational History   Not on file  Tobacco Use   Smoking status: Former    Types: Cigarettes   Smokeless tobacco: Never   Tobacco comments:    About age 14  Vaping Use   Vaping status: Never Used  Substance and Sexual Activity   Alcohol use: No    Alcohol/week: 0.0 standard drinks of alcohol   Drug use: No   Sexual activity: Not Currently  Other Topics Concern   Not on file  Social History Narrative   Not on file   Social Drivers of Health   Financial Resource Strain: Low Risk  (05/31/2022)   Received [facility]   Overall Financial Resource Strain (CARDIA)    Difficulty of Paying Living Expenses: Not hard at all  Food Insecurity: Patient Unable To Answer (02/08/2023)   Hunger Vital Sign    Worried About Running Out of Food in the Last Year: Patient unable to answer    Ran Out of Food in the Last Year: Patient unable to answer  Transportation Needs: Patient Unable To  Answer (02/08/2023)   PRAPARE - Transportation    Lack of Transportation (Medical): Patient unable to answer    Lack of Transportation (Non-Medical): Patient unable to answer  Physical Activity: Not on file  Stress: Not on file  Social Connections: Not on file  Intimate Partner Violence: Not At Risk (02/08/2023)   Humiliation, Afraid, Rape, and Kick questionnaire    Fear of Current or Ex-Partner: No    Emotionally Abused: No    Physically Abused: No    Sexually Abused: No    Functional Status Survey:    Family History  Problem Relation Age  of Onset   CAD Father    Hypertension Father    Tuberculosis Mother     Health Maintenance  Topic Date Due   DTaP/Tdap/Td (1 - Tdap) Never done   Zoster Vaccines- Shingrix (1 of 2) 05/23/1976   DEXA SCAN  Never done   Pneumonia Vaccine 49+ Years old (2 of 2 - PCV) 04/02/2014   Medicare Annual Wellness (AWV)  03/29/2017   INFLUENZA VACCINE  09/29/2022   COVID-19 Vaccine (4 - 2024-25 season) 10/30/2022   HPV VACCINES  Aged Out    No Known Allergies  Outpatient Encounter Medications as of 02/17/2023  Medication Sig   acetaminophen (TYLENOL 8 HOUR) 650 MG CR tablet Take 1 tablet (650 mg total) by mouth every 8 (eight) hours as needed for up to 7 days for pain or fever.   apixaban (ELIQUIS) 5 MG TABS tablet Take 1 tablet (5 mg total) by mouth 2 (two) times daily.   atorvastatin (LIPITOR) 40 MG tablet Take 1 tablet (40 mg total) by mouth daily at 6 PM.   cephALEXin (KEFLEX) 500 MG capsule Take 1 capsule (500 mg total) by mouth every 6 (six) hours for 7 days.   clotrimazole-betamethasone (LOTRISONE) cream Apply 1 Application topically daily.   ferrous gluconate (FERGON) 324 MG tablet Take 324 mg by mouth daily with breakfast.   furosemide (LASIX) 20 MG tablet Take 20 mg by mouth daily.   gemfibrozil (LOPID) 600 MG tablet Take 600 mg by mouth 2 (two) times daily before a meal.   metoprolol succinate (TOPROL-XL) 25 MG 24 hr tablet Take 0.5 tablets (12.5 mg total) by mouth daily.   omeprazole (PRILOSEC) 40 MG capsule Take 40 mg by mouth daily.   OXYGEN Inhale into the lungs. 2lpm every shift for SOB or dyspnea   potassium chloride (KLOR-CON M) 10 MEQ tablet Take 10 mEq by mouth daily.   sertraline (ZOLOFT) 25 MG tablet Take 25 mg by mouth daily.   spironolactone (ALDACTONE) 25 MG tablet Take 1 tablet (25 mg total) by mouth daily.   No facility-administered encounter medications on file as of 02/17/2023.    Review of Systems  Vitals:   02/17/23 0848  BP: 132/72  Pulse: 74  Resp: 20   Temp: (!) 97.1 F (36.2 C)  SpO2: 92%  Weight: 168 lb 8 oz (76.4 kg)  Height: 5\' 3"  (1.6 m)   Body mass index is 29.85 kg/m. Physical Exam  Labs reviewed: Basic Metabolic Panel: Recent Labs    02/12/23 0345 02/14/23 0558 02/15/23 0601 02/16/23 0000  NA 136 135 134* 137  K 3.4* 3.6 4.0 4.2  CL 105 104 103 103  CO2 22 25 25  26*  GLUCOSE 132* 95 85  --   BUN 37* 25* 26* 22*  CREATININE 0.72 0.67 0.68 0.6  CALCIUM 8.2* 8.2* 8.4* 7.9*   Liver Function Tests: Recent Labs  02/08/23 0320 02/10/23 0640 02/11/23 0513  AST 20 16 15   ALT 11 9 13   ALKPHOS 96 103 102  BILITOT 1.5* 1.0 0.7  PROT 6.0* 6.0* 5.4*  ALBUMIN 2.4* 2.3* 2.1*   No results for input(s): "LIPASE", "AMYLASE" in the last 8760 hours. No results for input(s): "AMMONIA" in the last 8760 hours. CBC: Recent Labs    12/12/22 0830 02/07/23 1320 02/08/23 0320 02/10/23 0640 02/11/23 0513 02/12/23 0345 02/16/23 0000  WBC 6.8 19.0*   < > 11.3* 10.7* 11.3* 9.8  NEUTROABS 4,814 16.0*  --   --   --   --  7,438.00  HGB 13.9 14.2   < > 13.0 11.8* 12.0 12.4  HCT 42.7 41.0   < > 37.7 34.5* 35.7* 39  MCV 95.1 88.7   < > 90.6 89.8 93.7  --   PLT 201 145*   < > 146* 169 207 288   < > = values in this interval not displayed.   Cardiac Enzymes: No results for input(s): "CKTOTAL", "CKMB", "CKMBINDEX", "TROPONINI" in the last 8760 hours. BNP: Invalid input(s): "POCBNP" Lab Results  Component Value Date   HGBA1C 6.0 (H) 12/12/2022   Lab Results  Component Value Date   TSH 2.29 12/12/2022   No results found for: "VITAMINB12" No results found for: "FOLATE" No results found for: "IRON", "TIBC", "FERRITIN"  Imaging and Procedures obtained prior to SNF admission: DG Chest Port 1 View Result Date: 02/07/2023 CLINICAL DATA:  Cough, short of breath EXAM: PORTABLE CHEST 1 VIEW COMPARISON:  08/21/2016 FINDINGS: Single frontal view of the chest demonstrates stable enlargement of the cardiac silhouette. Moderate  hiatal hernia. There is patchy bilateral airspace disease, most pronounced in the right upper lobe abutting the minor fissure. Atelectatic changes are again seen at the lung bases. No effusion or pneumothorax. Chronic elevation of the right hemidiaphragm. No acute bony abnormalities. IMPRESSION: 1. Patchy bilateral perihilar airspace disease most pronounced in the right upper lobe, compatible with bronchopneumonia. 2. Hiatal hernia. Electronically Signed   By: Sharlet Salina M.D.   On: 02/07/2023 20:45   CT ABDOMEN PELVIS WO CONTRAST Result Date: 02/07/2023 CLINICAL DATA:  UTI, recurrent/complicated (Female). Altered mental status. EXAM: CT ABDOMEN AND PELVIS WITHOUT CONTRAST TECHNIQUE: Multidetector CT imaging of the abdomen and pelvis was performed following the standard protocol without IV contrast. RADIATION DOSE REDUCTION: This exam was performed according to the departmental dose-optimization program which includes automated exposure control, adjustment of the mA and/or kV according to patient size and/or use of iterative reconstruction technique. COMPARISON:  None Available. FINDINGS: Lower chest: There are patchy atelectatic changes in the visualized lung bases. No overt consolidation. No pleural effusion. The heart is normal in size. No pericardial effusion. There is a slightly lobulated 1.4 x 2.1 cm soft tissue attenuation nodule in the left breast, which is incompletely characterized on the current exam. Correlate clinically to determine the need for additional imaging. Hepatobiliary: The liver is normal in size. Non-cirrhotic configuration. No suspicious mass. No intrahepatic or extrahepatic bile duct dilation. Small volume dependent peripherally rim calcified gallstones noted without imaging signs of acute cholecystitis. Normal gallbladder wall thickness. No pericholecystic inflammatory changes. Pancreas: Unremarkable. No pancreatic ductal dilatation or surrounding inflammatory changes. Spleen:  Within normal limits. No focal lesion. Adrenals/Urinary Tract: Adrenal glands are unremarkable. Evaluation of bilateral kidneys is limited due to lack of intravenous contrast. There is a partially exophytic cyst arising from the right kidney interpolar region, anteromedially measuring 2.5 x 3.1 cm. There are  additional smaller cysts in bilateral kidneys. There are at least 2, sub 4 mm nonobstructing calculi in the right kidney and a single punctate nonobstructing calculus in the left kidney lower pole. No ureterolithiasis on either side. No hydroureteronephrosis. Urinary bladder is under distended, precluding optimal assessment. However, no large mass or stones identified. No perivesical fat stranding. Stomach/Bowel: Small-to-moderate hiatal hernia noted. No disproportionate dilation of the small or large bowel loops. No evidence of abnormal bowel wall thickening or inflammatory changes. The appendix was not visualized; however there is no acute inflammatory process in the right lower quadrant. There are innumerable diverticula throughout the colon, without imaging signs of diverticulitis. Vascular/Lymphatic: No ascites or pneumoperitoneum. No abdominal or pelvic lymphadenopathy, by size criteria. No aneurysmal dilation of the major abdominal arteries. There are moderate peripheral atherosclerotic vascular calcifications of the aorta and its major branches. Reproductive: The uterus is surgically absent. No large adnexal mass. Other: There is a tiny fat containing umbilical hernia. The soft tissues and abdominal wall are otherwise unremarkable. Musculoskeletal: No suspicious osseous lesions. There are moderate multilevel degenerative changes in the visualized spine. T9 and L1 vertebral body hemangiomas noted. Patient is status post bilateral hip arthroplasty. IMPRESSION: 1. No acute inflammatory process identified within the abdomen or pelvis. 2. There are bilateral sub 4 mm nonobstructing renal calculi. No  ureterolithiasis or obstructive uropathy on either side. 3. There is a slightly lobulated 1.4 x 2.1 cm soft tissue attenuation nodule in the left breast, incompletely characterized on the current exam. Correlate clinically to determine the need for additional imaging. 4. Multiple other nonacute observations, as described above. Electronically Signed   By: Jules Schick M.D.   On: 02/07/2023 16:05   CT Head Wo Contrast Result Date: 02/07/2023 CLINICAL DATA:  Provided history: Mental status change, unknown cause. EXAM: CT HEAD WITHOUT CONTRAST TECHNIQUE: Contiguous axial images were obtained from the base of the skull through the vertex without intravenous contrast. RADIATION DOSE REDUCTION: This exam was performed according to the departmental dose-optimization program which includes automated exposure control, adjustment of the mA and/or kV according to patient size and/or use of iterative reconstruction technique. COMPARISON:  Brain MRI 08/21/2016.  Head CT 08/21/2016. FINDINGS: Brain: Generalized cerebral atrophy. Patchy and ill-defined hypoattenuation within the cerebral white matter, nonspecific but compatible with mild chronic small vessel ischemic disease. Prominent perivascular space within the inferior left basal ganglia. Partially empty sella turcica. There is no acute intracranial hemorrhage. No demarcated cortical infarct. No extra-axial fluid collection. No evidence of an intracranial mass. No midline shift. Vascular: No hyperdense vessel.  Atherosclerotic calcifications. Skull: No calvarial fracture or aggressive osseous lesion. Sinuses/Orbits: No mass or acute finding within the imaged orbits. No significant paranasal sinus disease at imaged levels. IMPRESSION: 1. No evidence of an acute intracranial abnormality. 2. Parenchymal atrophy and chronic small vessel ischemic disease, as described. Electronically Signed   By: Jackey Loge D.O.   On: 02/07/2023 15:03    Assessment/Plan There are no  diagnoses linked to this encounter.   Family/ staff Communication:   Labs/tests ordered:

## 2023-02-18 ENCOUNTER — Encounter: Payer: Self-pay | Admitting: Student

## 2023-02-21 ENCOUNTER — Telehealth: Payer: Medicare Other | Admitting: Orthopedic Surgery

## 2023-02-21 NOTE — Telephone Encounter (Signed)
[facility] nursing reports mild amount of rectal bleeding after BM. Patient asymptomatic. Vitals stable. Recent hgb 12.0 per EPIC. Also taking omeprazole. She is on Eliquis due to atrial fibrillation> orders given to hold evening dose. Nursing did not check for hemorrhoids. Unsure about hard stools. If hemorrhoid present may start Anusol BID x 7 days. Advised to order cbc/diff 12/26. Advised to notify provider if any future episodes of rectal bleeding.

## 2023-02-23 LAB — CBC AND DIFFERENTIAL
HCT: 38 (ref 36–46)
Hemoglobin: 12.1 (ref 12.0–16.0)
Neutrophils Absolute: 4196
Platelets: 200 10*3/uL (ref 150–400)
WBC: 6.3

## 2023-02-23 LAB — CBC: RBC: 3.75 — AB (ref 3.87–5.11)

## 2023-03-17 ENCOUNTER — Encounter: Payer: Self-pay | Admitting: Student

## 2023-03-17 ENCOUNTER — Non-Acute Institutional Stay (SKILLED_NURSING_FACILITY): Payer: Medicare Other | Admitting: Student

## 2023-03-17 DIAGNOSIS — A419 Sepsis, unspecified organism: Secondary | ICD-10-CM

## 2023-03-17 DIAGNOSIS — F03B0 Unspecified dementia, moderate, without behavioral disturbance, psychotic disturbance, mood disturbance, and anxiety: Secondary | ICD-10-CM

## 2023-03-17 DIAGNOSIS — R6520 Severe sepsis without septic shock: Secondary | ICD-10-CM

## 2023-03-17 DIAGNOSIS — I4891 Unspecified atrial fibrillation: Secondary | ICD-10-CM | POA: Diagnosis not present

## 2023-03-17 DIAGNOSIS — I10 Essential (primary) hypertension: Secondary | ICD-10-CM

## 2023-03-17 DIAGNOSIS — I509 Heart failure, unspecified: Secondary | ICD-10-CM | POA: Diagnosis not present

## 2023-03-17 DIAGNOSIS — Z7901 Long term (current) use of anticoagulants: Secondary | ICD-10-CM | POA: Diagnosis not present

## 2023-03-17 DIAGNOSIS — R482 Apraxia: Secondary | ICD-10-CM

## 2023-03-17 DIAGNOSIS — I4892 Unspecified atrial flutter: Secondary | ICD-10-CM

## 2023-03-17 DIAGNOSIS — H9190 Unspecified hearing loss, unspecified ear: Secondary | ICD-10-CM

## 2023-03-17 DIAGNOSIS — I739 Peripheral vascular disease, unspecified: Secondary | ICD-10-CM

## 2023-03-17 NOTE — Progress Notes (Signed)
Location:  [facility] [place].  Nursing Home Room Number: [facility] [address]A Place of Service:  SNF [phone]) Provider:  Earnestine Mealing, MD  Patient Care Team: Earnestine Mealing, MD as PCP - General (Family Medicine) Delma Freeze, FNP as Nurse Practitioner (Family Medicine) Iran Ouch, MD as Consulting Physician (Cardiology)  Extended Emergency Contact Information Primary Emergency Contact: [name],[name] Address: [address]          [address]          [place], [place] [address] Darden Amber of [place] Home Phone: [phone] Mobile Phone: [phone] Relation: Daughter Secondary Emergency Contact: Zan, Loayza Mobile Phone: [phone] Relation: Son  Code Status:  DNR Goals of care: Advanced Directive information    03/17/2023    9:16 AM  Advanced Directives  Does Patient Have a Medical Advance Directive? Yes  Type of Advance Directive Out of facility DNR (pink MOST or yellow form)  Does patient want to make changes to medical advance directive? No - Patient declined     Chief Complaint  Patient presents with   Medical Management of Chronic Issues    Medical Management of Chronic Issues.     HPI:  Pt is a 89 y.o. [place] seen today for medical management of chronic diseases.  History of Present Illness The patient reports a generally stable condition with no new or worsening symptoms. She has been engaging in various activities such as looking at old pictures, doing word searches, and reading books. She has been working well with therapy, managing to shower and use the bathroom independently. She also reports being able to stand up independently.  The patient has been eating well, consuming foods she enjoys, including cookies and other goodies. She has not reported any issues with bowel movements. She has been wearing compression stockings as part of her daily routine.  The patient also shares some personal anecdotes about her family, indicating a good mental  state and memory recall. She has not reported any new symptoms or concerns during this consultation.  Nursing without concerns at this time.   Past Medical History:  Diagnosis Date   Arthritis    CHF (congestive heart failure) (HCC)    Chronic atrial fibrillation (HCC)    Coronary artery disease    Non-ST elevation myocardial infarction in February 2017. Cardiac catheterization showed significant two-vessel coronary artery disease affecting the right coronary artery and left circumflex with no significant disease involving the LAD. Ejection fraction was 30-35% with akinesis of the mid to distal and apical segments consistent with stress-induced cardiomyopathy.   HTN (hypertension)    Hyperlipidemia    MI (myocardial infarction) [facility])    Peripheral neuralgia    Past Surgical History:  Procedure Laterality Date   APPENDECTOMY     CARDIAC CATHETERIZATION N/A 04/23/2015   Procedure: Left Heart Cath and Coronary Angiography;  Surgeon: Iran Ouch, MD;  Location: [facility] INVASIVE CV LAB;  Service: Cardiovascular;  Laterality: N/A;   LUMBAR SPINE SURGERY      No Known Allergies  Outpatient Encounter Medications as of 03/17/2023  Medication Sig   acetaminophen (TYLENOL) 500 MG tablet Take 1,000 mg by mouth every 8 (eight) hours as needed.   apixaban (ELIQUIS) 5 MG TABS tablet Take 1 tablet (5 mg total) by mouth 2 (two) times daily.   atorvastatin (LIPITOR) 40 MG tablet Take 1 tablet (40 mg total) by mouth daily at 6 PM.   clotrimazole-betamethasone (LOTRISONE) cream Apply 1 Application topically daily.   ferrous gluconate (  FERGON) 324 MG tablet Take 324 mg by mouth daily with breakfast.   furosemide (LASIX) 20 MG tablet Take 20 mg by mouth daily.   gemfibrozil (LOPID) 600 MG tablet Take 600 mg by mouth 2 (two) times daily before a meal.   metoprolol succinate (TOPROL-XL) 25 MG 24 hr tablet Take 0.5 tablets (12.5 mg total) by mouth daily.   omeprazole (PRILOSEC) 40 MG capsule Take 40 mg by  mouth daily.   potassium chloride (KLOR-CON M) 10 MEQ tablet Take 10 mEq by mouth daily.   sertraline (ZOLOFT) 50 MG tablet Take 50 mg by mouth daily.   spironolactone (ALDACTONE) 25 MG tablet Take 1 tablet (25 mg total) by mouth daily.   OXYGEN Inhale into the lungs. 2lpm every shift for SOB or dyspnea (Patient not taking: Reported on 03/17/2023)   No facility-administered encounter medications on file as of 03/17/2023.    Review of Systems  Immunization History  Administered Date(s) Administered   Influenza Inj Mdck Quad Pf 11/17/2020, 11/22/2021   Influenza-Unspecified 12/27/2016, 12/08/2018, 12/28/2019   Moderna Sars-Covid-2 Vaccination 03/15/2019, 04/12/2019, 01/10/2020   Pneumococcal Polysaccharide-23 03/27/2012, 04/02/2013   Zoster, Live 03/23/2015   Pertinent  Health Maintenance Due  Topic Date Due   DEXA SCAN  Never done   INFLUENZA VACCINE  09/29/2022      05/05/2015    9:18 AM 06/02/2015   10:21 AM 12/07/2022    2:30 PM 01/18/2023    1:20 PM  Fall Risk  Falls in the past year? Yes No 0 0  Was there an injury with Fall? Yes  0 0  Fall Risk Category Calculator   0 0  Patient at Risk for Falls Due to    No Fall Risks  Fall risk Follow up Falls prevention discussed   Falls evaluation completed   Functional Status Survey:    Vitals:   03/17/23 0909 03/17/23 0919  BP: (!) 143/67 118/65  Pulse: (!) 114   Resp: 20   Temp: (!) 97.3 F (36.3 C)   SpO2: 92%   Weight: 165 lb 9.6 oz (75.1 kg)   Height: 5\' 3"  (1.6 m)    Body mass index is 29.33 kg/m. Physical Exam Physical Exam General: sitting in recliner watching television with word search and book on her lap CHEST: Lungs clear to auscultation. CARDIOVASCULAR: Heart sounds normal, regular rate and rhythm. EXTREMITIES: Compression stockings in place. Labs reviewed: Recent Labs    02/12/23 0345 02/14/23 0558 02/15/23 0601 02/16/23 0000  NA 136 135 134* 137  K 3.4* 3.6 4.0 4.2  CL 105 104 103 103  CO2 22 25  25  26*  GLUCOSE 132* Kathy 85  --   BUN 37* 25* 26* 22*  CREATININE 0.72 0.67 0.68 0.6  CALCIUM 8.2* 8.2* 8.4* 7.9*   Recent Labs    02/08/23 0320 02/10/23 0640 02/11/23 0513  AST 20 16 15   ALT 11 9 13   ALKPHOS 96 103 102  BILITOT 1.5* 1.0 0.7  PROT 6.0* 6.0* 5.4*  ALBUMIN 2.4* 2.3* 2.1*   Recent Labs    02/07/23 1320 02/08/23 0320 02/10/23 0640 02/11/23 0513 02/12/23 0345 02/16/23 0000 02/23/23 0000  WBC 19.0*   < > 11.3* 10.7* 11.3* 9.8 6.3  NEUTROABS 16.0*  --   --   --   --  7,438.00 4,196.00  HGB 14.2   < > 13.0 11.8* 12.0 12.4 12.1  HCT 41.0   < > 37.7 34.5* 35.7* 39 38  MCV 88.7   < >  90.6 89.8 93.7  --   --   PLT 145*   < > 146* 169 207 288 200   < > = values in this interval not displayed.   Lab Results  Component Value Date   TSH 2.29 12/12/2022   Lab Results  Component Value Date   HGBA1C 6.0 (H) 12/12/2022   Lab Results  Component Value Date   CHOL 104 12/12/2022   HDL 33 (L) 12/12/2022   LDLCALC 59 12/12/2022   TRIG 42 12/12/2022   CHOLHDL 3.2 12/12/2022    Significant Diagnostic Results in last 30 days:  No results found.  Assessment/Plan  Pneumonia  Urosepsis Altered mental Status Patient with recent admission for confusion in the setting of urosepsis and pneumonia, now at cognitive baseline. Improved verbal communication since starting speech therapy.   Atrial Fibrillation No signs of bleeding, rate controlled Continue eliquis and Toprol  GERD Symptoms controlled with PPI  Depression  Mood stable with Zoloft 50 mg daily   General Health Maintenance Engaging in activities such as reading and word search. Working well with therapy, maintaining good dietary habits, and regular bowel movements. Wearing compression stockings and able to ambulate. Lungs are clear, heart sounds normal with regular rate and rhythm. - Encourage continued engagement in activities - Maintain a balanced diet with adequate protein - Continue wearing  compression stockings - Ensure adequate hydration.

## 2023-03-31 ENCOUNTER — Non-Acute Institutional Stay (SKILLED_NURSING_FACILITY): Payer: Medicare Other | Admitting: Student

## 2023-03-31 ENCOUNTER — Encounter: Payer: Self-pay | Admitting: Student

## 2023-03-31 DIAGNOSIS — B351 Tinea unguium: Secondary | ICD-10-CM

## 2023-03-31 NOTE — Progress Notes (Unsigned)
Location:  [facility] [place].  Nursing Home Room Number: [facility] [address] Place of Service:  SNF [phone]) Provider:  Earnestine Mealing, MD  Patient Care Team: Earnestine Mealing, MD as PCP - General (Family Medicine) Delma Freeze, FNP as Nurse Practitioner (Family Medicine) Iran Ouch, MD as Consulting Physician (Cardiology)  Extended Emergency Contact Information Primary Emergency Contact: [name],[name] Address: [address]          [address]          [place], [place] [address] Darden Amber of [place] Home Phone: [phone] Mobile Phone: [phone] Relation: Daughter Secondary Emergency Contact: Cobi, [place] Mobile Phone: [phone] Relation: Son  Code Status:  DNR Goals of care: Advanced Directive information    03/31/2023   10:32 AM  Advanced Directives  Does Patient Have a Medical Advance Directive? Yes  Type of Advance Directive Living will;Out of facility DNR (pink MOST or yellow form)  Does patient want to make changes to medical advance directive? No - Patient declined     Chief Complaint  Patient presents with   Acute Visit    Toenails.     HPI:  Pt is a 89 y.o. female seen today for an acute visit for Toenails.  She is okay with trimming her toenails and says thank you for doing them.   Past Medical History:  Diagnosis Date   Arthritis    CHF (congestive heart failure) (HCC)    Chronic atrial fibrillation (HCC)    Coronary artery disease    Non-ST elevation myocardial infarction in February 2017. Cardiac catheterization showed significant two-vessel coronary artery disease affecting the right coronary artery and left circumflex with no significant disease involving the LAD. Ejection fraction was 30-35% with akinesis of the mid to distal and apical segments consistent with stress-induced cardiomyopathy.   HTN (hypertension)    Hyperlipidemia    MI (myocardial infarction) [facility])    Peripheral neuralgia    Past Surgical History:  Procedure  Laterality Date   APPENDECTOMY     CARDIAC CATHETERIZATION N/A 04/23/2015   Procedure: Left Heart Cath and Coronary Angiography;  Surgeon: Iran Ouch, MD;  Location: [facility] INVASIVE CV LAB;  Service: Cardiovascular;  Laterality: N/A;   LUMBAR SPINE SURGERY      No Known Allergies  Outpatient Encounter Medications as of 03/31/2023  Medication Sig   apixaban (ELIQUIS) 5 MG TABS tablet Take 1 tablet (5 mg total) by mouth 2 (two) times daily.   atorvastatin (LIPITOR) 40 MG tablet Take 1 tablet (40 mg total) by mouth daily at 6 PM.   ferrous gluconate (FERGON) 324 MG tablet Take 324 mg by mouth daily with breakfast.   furosemide (LASIX) 20 MG tablet Take 20 mg by mouth daily.   metoprolol succinate (TOPROL-XL) 25 MG 24 hr tablet Take 0.5 tablets (12.5 mg total) by mouth daily.   nystatin cream (MYCOSTATIN) Apply 1 Application topically daily.   omeprazole (PRILOSEC) 40 MG capsule Take 40 mg by mouth daily.   potassium chloride (KLOR-CON M) 10 MEQ tablet Take 10 mEq by mouth daily.   sertraline (ZOLOFT) 50 MG tablet Take 50 mg by mouth daily.   spironolactone (ALDACTONE) 25 MG tablet Take 1 tablet (25 mg total) by mouth daily.   acetaminophen (TYLENOL) 500 MG tablet Take 1,000 mg by mouth every 8 (eight) hours as needed. (Patient not taking: Reported on 03/31/2023)   clotrimazole-betamethasone (LOTRISONE) cream Apply 1 Application topically daily. (Patient not taking: Reported on 03/31/2023)   No facility-administered encounter  medications on file as of 03/31/2023.    Review of Systems  Immunization History  Administered Date(s) Administered   Influenza Inj Mdck Quad Pf 11/17/2020, 11/22/2021   Influenza-Unspecified 12/27/2016, 12/08/2018, 12/28/2019   Moderna Sars-Covid-2 Vaccination 03/15/2019, 04/12/2019, 01/10/2020   Pneumococcal Polysaccharide-23 03/27/2012, 04/02/2013   Zoster, Live 03/23/2015   Pertinent  Health Maintenance Due  Topic Date Due   DEXA SCAN  Never done    INFLUENZA VACCINE  09/29/2022      05/05/2015    9:18 AM 06/02/2015   10:21 AM 12/07/2022    2:30 PM 01/18/2023    1:20 PM  Fall Risk  Falls in the past year? Yes No 0 0  Was there an injury with Fall? Yes  0 0  Fall Risk Category Calculator   0 0  Patient at Risk for Falls Due to    No Fall Risks  Fall risk Follow up Falls prevention discussed   Falls evaluation completed   Functional Status Survey:    Vitals:   03/31/23 1027  BP: (!) 140/72  Pulse: (!) 36  Resp: 14  Temp: 97.6 F (36.4 C)  SpO2: 90%  Weight: 165 lb 6.4 oz (75 kg)  Height: 5\' 3"  (1.6 m)   Body mass index is 29.3 kg/m. Physical Exam Skin:    Comments: Bilateral hypertrophic mycotic toenails  Neurological:     Mental Status: She is alert.     Labs reviewed: Recent Labs    02/12/23 0345 02/14/23 0558 02/15/23 0601 02/16/23 0000  NA 136 135 134* 137  K 3.4* 3.6 4.0 4.2  CL 105 104 103 103  CO2 22 25 25  26*  GLUCOSE 132* 95 85  --   BUN 37* 25* 26* 22*  CREATININE 0.72 0.67 0.68 0.6  CALCIUM 8.2* 8.2* 8.4* 7.9*   Recent Labs    02/08/23 0320 02/10/23 0640 02/11/23 0513  AST 20 16 15   ALT 11 9 13   ALKPHOS 96 103 102  BILITOT 1.5* 1.0 0.7  PROT 6.0* 6.0* 5.4*  ALBUMIN 2.4* 2.3* 2.1*   Recent Labs    02/07/23 1320 02/08/23 0320 02/10/23 0640 02/11/23 0513 02/12/23 0345 02/16/23 0000 02/23/23 0000  WBC 19.0*   < > 11.3* 10.7* 11.3* 9.8 6.3  NEUTROABS 16.0*  --   --   --   --  7,438.00 4,196.00  HGB 14.2   < > 13.0 11.8* 12.0 12.4 12.1  HCT 41.0   < > 37.7 34.5* 35.7* 39 38  MCV 88.7   < > 90.6 89.8 93.7  --   --   PLT 145*   < > 146* 169 207 288 200   < > = values in this interval not displayed.   Lab Results  Component Value Date   TSH 2.29 12/12/2022   Lab Results  Component Value Date   HGBA1C 6.0 (H) 12/12/2022   Lab Results  Component Value Date   CHOL 104 12/12/2022   HDL 33 (L) 12/12/2022   LDLCALC 59 12/12/2022   TRIG 42 12/12/2022   CHOLHDL 3.2 12/12/2022     Significant Diagnostic Results in last 30 days:  No results found.  Assessment/Plan No diagnosis found. -Consent given for treatment as described below: -Examined patient. -Continue supportive shoe gear daily. -Mycotic toenails 10  bilaterally were debrided in length and girth with sterile nail nippers and dremel without incident. -Patient/POA to call should there be question/concern in the interim.  Family/ staff Communication: nursing  Labs/tests ordered:  none

## 2023-04-01 ENCOUNTER — Encounter: Payer: Self-pay | Admitting: Student

## 2023-04-11 ENCOUNTER — Non-Acute Institutional Stay (SKILLED_NURSING_FACILITY): Payer: Medicare Other | Admitting: Nurse Practitioner

## 2023-04-11 ENCOUNTER — Encounter: Payer: Self-pay | Admitting: Nurse Practitioner

## 2023-04-11 DIAGNOSIS — B351 Tinea unguium: Secondary | ICD-10-CM

## 2023-04-11 DIAGNOSIS — I509 Heart failure, unspecified: Secondary | ICD-10-CM

## 2023-04-11 DIAGNOSIS — E782 Mixed hyperlipidemia: Secondary | ICD-10-CM

## 2023-04-11 DIAGNOSIS — I10 Essential (primary) hypertension: Secondary | ICD-10-CM

## 2023-04-11 DIAGNOSIS — F03B0 Unspecified dementia, moderate, without behavioral disturbance, psychotic disturbance, mood disturbance, and anxiety: Secondary | ICD-10-CM

## 2023-04-11 DIAGNOSIS — I4891 Unspecified atrial fibrillation: Secondary | ICD-10-CM

## 2023-04-11 DIAGNOSIS — R482 Apraxia: Secondary | ICD-10-CM

## 2023-04-11 DIAGNOSIS — Z7901 Long term (current) use of anticoagulants: Secondary | ICD-10-CM | POA: Diagnosis not present

## 2023-04-11 DIAGNOSIS — I4892 Unspecified atrial flutter: Secondary | ICD-10-CM

## 2023-04-11 NOTE — Progress Notes (Deleted)
Location:  Other [place].  Nursing Home Room Number: [facility] [address] Place of Service:  SNF [phone]) Abbey Chatters, NP  PCP: Earnestine Mealing, MD  Patient Care Team: Earnestine Mealing, MD as PCP - General (Family Medicine) Delma Freeze, FNP as Nurse Practitioner (Family Medicine) Iran Ouch, MD as Consulting Physician (Cardiology)  Extended Emergency Contact Information Primary Emergency Contact: [name],[name] Address: [address]          [address]          [place], [place] [address] Darden [place] of [place] Home Phone: [phone] Mobile Phone: [phone] Relation: Daughter Secondary Emergency Contact: Chalese, [place] Mobile Phone: [phone] Relation: Son  Goals of care: Advanced Directive information    04/11/2023    9:05 AM  Advanced Directives  Does Patient Have a Medical Advance Directive? Yes  Type of Advance Directive Out of facility DNR (pink MOST or yellow form)  Does patient want to make changes to medical advance directive? No - Patient declined     Chief Complaint  Patient presents with   Medical Management of Chronic Issues    Medical Management of Chronic Issues.     HPI:  Pt is a 89 y.o. female seen today for medical management of chronic disease.    Past Medical History:  Diagnosis Date   Arthritis    CHF (congestive heart failure) (HCC)    Chronic atrial fibrillation (HCC)    Coronary artery disease    Non-ST elevation myocardial infarction in February 2017. Cardiac catheterization showed significant two-vessel coronary artery disease affecting the right coronary artery and left circumflex with no significant disease involving the LAD. Ejection fraction was 30-35% with akinesis of the mid to distal and apical segments consistent with stress-induced cardiomyopathy.   HTN (hypertension)    Hyperlipidemia    MI (myocardial infarction) [facility])    Peripheral neuralgia    Past Surgical History:  Procedure Laterality Date    APPENDECTOMY     CARDIAC CATHETERIZATION N/A 04/23/2015   Procedure: Left Heart Cath and Coronary Angiography;  Surgeon: Iran Ouch, MD;  Location: [facility] INVASIVE CV LAB;  Service: Cardiovascular;  Laterality: N/A;   LUMBAR SPINE SURGERY      No Known Allergies  Outpatient Encounter Medications as of 04/11/2023  Medication Sig   apixaban (ELIQUIS) 5 MG TABS tablet Take 1 tablet (5 mg total) by mouth 2 (two) times daily.   atorvastatin (LIPITOR) 40 MG tablet Take 1 tablet (40 mg total) by mouth daily at 6 PM.   ferrous gluconate (FERGON) 324 MG tablet Take 324 mg by mouth daily with breakfast.   furosemide (LASIX) 20 MG tablet Take 20 mg by mouth daily.   metoprolol succinate (TOPROL-XL) 25 MG 24 hr tablet Take 0.5 tablets (12.5 mg total) by mouth daily.   omeprazole (PRILOSEC) 40 MG capsule Take 40 mg by mouth daily.   potassium chloride (KLOR-CON M) 10 MEQ tablet Take 10 mEq by mouth daily.   sertraline (ZOLOFT) 50 MG tablet Take 50 mg by mouth daily.   spironolactone (ALDACTONE) 25 MG tablet Take 1 tablet (25 mg total) by mouth daily.   nystatin cream (MYCOSTATIN) Apply 1 Application topically daily. (Patient not taking: Reported on 04/11/2023)   No facility-administered encounter medications on file as of 04/11/2023.    Review of Systems ***  Immunization History  Administered Date(s) Administered   Influenza Inj Mdck Quad Pf 11/17/2020, 11/22/2021   Influenza-Unspecified 12/27/2016, 12/08/2018, 12/28/2019   Moderna Sars-Covid-2 Vaccination 03/15/2019, 04/12/2019,  01/10/2020   Pneumococcal Polysaccharide-23 03/27/2012, 04/02/2013   Zoster, Live 03/23/2015   Pertinent  Health Maintenance Due  Topic Date Due   DEXA SCAN  Never done   INFLUENZA VACCINE  09/29/2022      05/05/2015    9:18 AM 06/02/2015   10:21 AM 12/07/2022    2:30 PM 01/18/2023    1:20 PM  Fall Risk  Falls in the past year? Yes No 0 0  Was there an injury with Fall? Yes  0 0  Fall Risk Category Calculator    0 0  Patient at Risk for Falls Due to    No Fall Risks  Fall risk Follow up Falls prevention discussed   Falls evaluation completed   Functional Status Survey:    Vitals:   04/11/23 0901  BP: 129/70  Pulse: 65  Resp: 20  Temp: 97.6 F (36.4 C)  SpO2: 90%  Weight: 163 lb 3.2 oz (74 kg)  Height: 5\' 3"  (1.6 m)   Body mass index is 28.91 kg/m. Physical Exam***  Labs reviewed: Recent Labs    02/12/23 0345 02/14/23 0558 02/15/23 0601 02/16/23 0000  NA 136 135 134* 137  K 3.4* 3.6 4.0 4.2  CL 105 104 103 103  CO2 22 25 25  26*  GLUCOSE 132* 95 85  --   BUN 37* 25* 26* 22*  CREATININE 0.72 0.67 0.68 0.6  CALCIUM 8.2* 8.2* 8.4* 7.9*   Recent Labs    02/08/23 0320 02/10/23 0640 02/11/23 0513  AST 20 16 15   ALT 11 9 13   ALKPHOS 96 103 102  BILITOT 1.5* 1.0 0.7  PROT 6.0* 6.0* 5.4*  ALBUMIN 2.4* 2.3* 2.1*   Recent Labs    02/07/23 1320 02/08/23 0320 02/10/23 0640 02/11/23 0513 02/12/23 0345 02/16/23 0000 02/23/23 0000  WBC 19.0*   < > 11.3* 10.7* 11.3* 9.8 6.3  NEUTROABS 16.0*  --   --   --   --  7,438.00 4,196.00  HGB 14.2   < > 13.0 11.8* 12.0 12.4 12.1  HCT 41.0   < > 37.7 34.5* 35.7* 39 38  MCV 88.7   < > 90.6 89.8 93.7  --   --   PLT 145*   < > 146* 169 207 288 200   < > = values in this interval not displayed.   Lab Results  Component Value Date   TSH 2.29 12/12/2022   Lab Results  Component Value Date   HGBA1C 6.0 (H) 12/12/2022   Lab Results  Component Value Date   CHOL 104 12/12/2022   HDL 33 (L) 12/12/2022   LDLCALC 59 12/12/2022   TRIG 42 12/12/2022   CHOLHDL 3.2 12/12/2022    Significant Diagnostic Results in last 30 days:  No results found.  Assessment/Plan No problem-specific Assessment & Plan notes found for this encounter.     Janene Harvey. Biagio Borg [facility] [phone]

## 2023-04-11 NOTE — Progress Notes (Deleted)
Location:  Other Nursing Home Room Number: [facility] [address] Place of Service:  SNF [phone])  Kathy Lowery, [place], MD  Patient Care Team: Earnestine Mealing, MD as PCP - General (Family Medicine) Delma Freeze, FNP as Nurse Practitioner (Family Medicine) Iran Ouch, MD as Consulting Physician (Cardiology)  Extended Emergency Contact Information Primary Emergency Contact: [name],[name] Address: [address]          [address]          [place], [place] [address] Darden Amber of [place] Home Phone: [phone] Mobile Phone: [phone] Relation: Daughter Secondary Emergency Contact: Rafeef, Lau Mobile Phone: [phone] Relation: Son  Goals of care: Advanced Directive information    03/31/2023   10:32 AM  Advanced Directives  Does Patient Have a Medical Advance Directive? Yes  Type of Advance Directive Living will;Out of facility DNR (pink MOST or yellow form)  Does patient want to make changes to medical advance directive? No - Patient declined     Chief Complaint  Patient presents with   Medical Management of Chronic Issues    Medical Management of Chronic Issues.     HPI:  Pt is a 89 y.o. female seen today for medical management of chronic disease. She was admitted to [facility] after hospitalization for pneumonia and UTI. She will not be long term care at [facility] due to immobility and requiring assistance with her ADLs   She is seated in her recliner and eating her breakfast this morning. Patient is pleasant and able to answer all questions appropriately. 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 She denies any bleeding, bloody stools, constipation, changes in mood, chest pain, shortness of breath, leg and feet pain, and urinary changes. She states she has a normal bowel movement everyday.   She speaks about her marriage, husband, and children. She is enjoying looking at the flowers her daughter sent her for Valentine's Day. She denies any acute needs at this time.     Past Medical History:  Diagnosis Date   Arthritis    CHF (congestive heart failure) (HCC)    Chronic atrial fibrillation (HCC)    Coronary artery disease    Non-ST elevation myocardial infarction in February 2017. Cardiac catheterization showed significant two-vessel coronary artery disease affecting the right coronary artery and left circumflex with no significant disease involving the LAD. Ejection fraction was 30-35% with akinesis of the mid to distal and apical segments consistent with stress-induced cardiomyopathy.   HTN (hypertension)    Hyperlipidemia    MI (myocardial infarction) [facility])    Peripheral neuralgia    Past Surgical History:  Procedure Laterality Date   APPENDECTOMY     CARDIAC CATHETERIZATION N/A 04/23/2015   Procedure: Left Heart Cath and Coronary Angiography;  Surgeon: Iran Ouch, MD;  Location: [facility] INVASIVE CV LAB;  Service: Cardiovascular;  Laterality: N/A;   LUMBAR SPINE SURGERY      No Known Allergies  Outpatient Encounter Medications as of 04/11/2023  Medication Sig   apixaban (ELIQUIS) 5 MG TABS tablet Take 1 tablet (5 mg total) by mouth 2 (two) times daily.   atorvastatin (LIPITOR) 40 MG tablet Take 1 tablet (40 mg total) by mouth daily at 6 PM.   ferrous gluconate (FERGON) 324 MG tablet Take 324 mg by mouth daily with breakfast.   furosemide (LASIX) 20 MG tablet Take 20 mg by mouth daily.   metoprolol succinate (TOPROL-XL) 25 MG 24 hr tablet Take 0.5 tablets (12.5 mg total) by mouth daily.   nystatin  cream (MYCOSTATIN) Apply 1 Application topically daily.   omeprazole (PRILOSEC) 40 MG capsule Take 40 mg by mouth daily.   potassium chloride (KLOR-CON M) 10 MEQ tablet Take 10 mEq by mouth daily.   sertraline (ZOLOFT) 50 MG tablet Take 50 mg by mouth daily.   spironolactone (ALDACTONE) 25 MG tablet Take 1 tablet (25 mg total) by mouth daily.   No facility-administered encounter medications on file as of 04/11/2023.    Review of Systems   Constitutional: Negative.   HENT: Negative.    Respiratory: Negative.    Cardiovascular:  Positive for leg swelling.  Gastrointestinal: Negative.   Endocrine: Negative.   Genitourinary:  Positive for frequency.  Musculoskeletal: Negative.   Skin: Negative.   Allergic/Immunologic: Negative.   Neurological:  Positive for speech difficulty.  Hematological: Negative.   Psychiatric/Behavioral: Negative.       Immunization History  Administered Date(s) Administered   Influenza Inj Mdck Quad Pf 11/17/2020, 11/22/2021   Influenza-Unspecified 12/27/2016, 12/08/2018, 12/28/2019   Moderna Sars-Covid-2 Vaccination 03/15/2019, 04/12/2019, 01/10/2020   Pneumococcal Polysaccharide-23 03/27/2012, 04/02/2013   Zoster, Live 03/23/2015   Pertinent  Health Maintenance Due  Topic Date Due   DEXA SCAN  Never done   INFLUENZA VACCINE  09/29/2022      05/05/2015    9:18 AM 06/02/2015   10:21 AM 12/07/2022    2:30 PM 01/18/2023    1:20 PM  Fall Risk  Falls in the past year? Yes No 0 0  Was there an injury with Fall? Yes  0 0  Fall Risk Category Calculator   0 0  Patient at Risk for Falls Due to    No Fall Risks  Fall risk Follow up Falls prevention discussed   Falls evaluation completed   Functional Status Survey:    Vitals:   04/11/23 0901  BP: 129/70  Pulse: 65  Resp: 20  Temp: 97.6 F (36.4 C)  SpO2: 90%  Weight: 163 lb 3.2 oz (74 kg)  Height: 5\' 3"  (1.6 m)   Body mass index is 28.91 kg/m. Physical Exam Constitutional:      Appearance: Normal appearance.  HENT:     Head: Normocephalic and atraumatic.     Right Ear: External ear normal.     Left Ear: External ear normal.     Nose: Nose normal.     Mouth/Throat:     Mouth: Mucous membranes are moist.     Pharynx: Oropharynx is clear.  Eyes:     Conjunctiva/sclera: Conjunctivae normal.  Cardiovascular:     Rate and Rhythm: Normal rate and regular rhythm.     Pulses:          Dorsalis pedis pulses are 1+ on the right side  and 1+ on the left side.     Heart sounds: Normal heart sounds.  Pulmonary:     Effort: Pulmonary effort is normal.     Breath sounds: Normal breath sounds.  Abdominal:     General: Bowel sounds are normal.     Palpations: Abdomen is soft.  Musculoskeletal:     Cervical back: Neck supple.     Right lower leg: Edema present.     Left lower leg: Edema present.     Right foot: Swelling present.     Left foot: Swelling present.  Feet:     Right foot:     Skin integrity: Callus and dry skin present.     Toenail Condition: Fungal disease present.    Left foot:  Skin integrity: Callus and dry skin present.     Toenail Condition: Fungal disease present. Skin:    General: Skin is warm and dry.     Capillary Refill: Capillary refill takes 2 to 3 seconds.     Findings: Abrasion, bruising and erythema present.     Comments: Small abrasion from scratching to right cheek. Generalized bruising noted. BLE swelling, warm to touch, purple and red in color with erythema (not painful to touch) to right lower leg.  Neurological:     General: No focal deficit present.     Mental Status: She is alert. Mental status is at baseline.     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        Thought Content: Thought content normal.    Labs reviewed: Recent Labs    02/12/23 0345 02/14/23 0558 02/15/23 0601 02/16/23 0000  NA 136 135 134* 137  K 3.4* 3.6 4.0 4.2  CL 105 104 103 103  CO2 22 25 25  26*  GLUCOSE 132* 95 85  --   BUN 37* 25* 26* 22*  CREATININE 0.72 0.67 0.68 0.6  CALCIUM 8.2* 8.2* 8.4* 7.9*   Recent Labs    02/08/23 0320 02/10/23 0640 02/11/23 0513  AST 20 16 15   ALT 11 9 13   ALKPHOS 96 103 102  BILITOT 1.5* 1.0 0.7  PROT 6.0* 6.0* 5.4*  ALBUMIN 2.4* 2.3* 2.1*   Recent Labs    02/07/23 1320 02/08/23 0320 02/10/23 0640 02/11/23 0513 02/12/23 0345 02/16/23 0000 02/23/23 0000  WBC 19.0*   < > 11.3* 10.7* 11.3* 9.8 6.3   NEUTROABS 16.0*  --   --   --   --  7,438.00 4,196.00  HGB 14.2   < > 13.0 11.8* 12.0 12.4 12.1  HCT 41.0   < > 37.7 34.5* 35.7* 39 38  MCV 88.7   < > 90.6 89.8 93.7  --   --   PLT 145*   < > 146* 169 207 288 200   < > = values in this interval not displayed.   Lab Results  Component Value Date   TSH 2.29 12/12/2022   Lab Results  Component Value Date   HGBA1C 6.0 (H) 12/12/2022   Lab Results  Component Value Date   CHOL 104 12/12/2022   HDL 33 (L) 12/12/2022   LDLCALC 59 12/12/2022   TRIG 42 12/12/2022   CHOLHDL 3.2 12/12/2022    Significant Diagnostic Results in last 30 days:  No results found.  Assessment/Plan  1. Chronic congestive heart failure, unspecified heart failure type (HCC) (Primary) - Stable, continue Lasix 20 mg daily, Toprol XL 12.5 mg daily, and spironolactone 25 mg daily - Continue potassium chloride 10 meq daily for hypokalemia prevention due to use of 2 diuretics - BLE swelling present, non pitting and chronic  - Patient advised to keep BLE elevated when seated in recliner.   2. Current use of long term anticoagulation - due to a fib, On Eliquis 5 mg BID - Generalized bruising present which is stable.  - Denies any issues with bleeding - Denies bloody stools and hematuria  3. Hyperlipidemia, mixed - Stable, LDL 59 and cholesterol 104 from 12/12/22 - Recheck lipid profile annually - Continue Lipitor 40 mg daily  4. Moderate dementia without behavioral disturbance, psychotic disturbance, mood disturbance, or anxiety, unspecified dementia type (HCC) - Stable, patient able to answer all questions appropriately.  Alert and oriented to person, place, and situation. - Shared stories of children and husband - Continue Zoloft 50 mg daily  5. Mycotic toenails - Recently trimmed by provider - Patient denies any pain to bilateral feet - All 10 toenails mycotic, thick, short and yellow- pt denies any discomfort - Continue to monitor for any adverse  symptoms  6. Essential hypertension - Stable, BP 129/70 - Continue Toprol XL 12.5 mg daily - Continue Lasix 20 mg daily - Continue spironolactone 25 mg daily  7. Apraxia - Stable, this is her baseline - Difficulty formulating/ recollection of certain words, overall able to communicate clearly  8. A fib Rate controlled. Continue current regimen   Lenord Fellers, DNP AGPCNP Student -I personally was present during the history, physical exam and medical decision-making activities of this service and have verified that the service and findings are accurately documented in the student's note  Mylo Driskill K. Biagio Borg [facility] [phone]

## 2023-04-11 NOTE — Progress Notes (Signed)
Location:  Other Nursing Home Room Number: [facility] [address] Place of Service:  SNF [phone])  Sydnee Cabal, [place], MD  Patient Care Team: Earnestine Mealing, MD as PCP - General (Family Medicine) Delma Freeze, FNP as Nurse Practitioner (Family Medicine) Iran Ouch, MD as Consulting Physician (Cardiology)  Extended Emergency Contact Information Primary Emergency Contact: [name],[name] Address: [address]          [address]          [place], [place] [address] Darden [place] of [place] Home Phone: [phone] Mobile Phone: [phone] Relation: Daughter Secondary Emergency Contact: Crimson, Dubberly Mobile Phone: [phone] Relation: Son  Goals of care: Advanced Directive information    04/11/2023    9:05 AM  Advanced Directives  Does Patient Have a Medical Advance Directive? Yes  Type of Advance Directive Out of facility DNR (pink MOST or yellow form)  Does patient want to make changes to medical advance directive? No - Patient declined     Chief Complaint  Patient presents with   Medical Management of Chronic Issues    Medical Management of Chronic Issues.     HPI:  Pt is a 89 y.o. female seen today for medical management of chronic disease. She was admitted to [facility] after hospitalization for pneumonia and UTI. She will not be long term care at [facility] due to immobility and requiring assistance with her ADLs   She is seated in her recliner and eating her breakfast this morning. Patient is pleasant and able to answer all questions appropriately. 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 She denies any bleeding, bloody stools, constipation, changes in mood, chest pain, shortness of breath, leg and feet pain, and urinary changes. She states she has a normal bowel movement everyday.   She speaks about her marriage, husband, and children. She is enjoying looking at the flowers her daughter sent her for Valentine's Day. She denies any acute needs at this time.    Past  Medical History:  Diagnosis Date   Arthritis    CHF (congestive heart failure) (HCC)    Chronic atrial fibrillation (HCC)    Coronary artery disease    Non-ST elevation myocardial infarction in February 2017. Cardiac catheterization showed significant two-vessel coronary artery disease affecting the right coronary artery and left circumflex with no significant disease involving the LAD. Ejection fraction was 30-35% with akinesis of the mid to distal and apical segments consistent with stress-induced cardiomyopathy.   HTN (hypertension)    Hyperlipidemia    MI (myocardial infarction) [facility])    Peripheral neuralgia    Past Surgical History:  Procedure Laterality Date   APPENDECTOMY     CARDIAC CATHETERIZATION N/A 04/23/2015   Procedure: Left Heart Cath and Coronary Angiography;  Surgeon: Iran Ouch, MD;  Location: [facility] INVASIVE CV LAB;  Service: Cardiovascular;  Laterality: N/A;   LUMBAR SPINE SURGERY      No Known Allergies  Outpatient Encounter Medications as of 04/11/2023  Medication Sig   apixaban (ELIQUIS) 5 MG TABS tablet Take 1 tablet (5 mg total) by mouth 2 (two) times daily.   atorvastatin (LIPITOR) 40 MG tablet Take 1 tablet (40 mg total) by mouth daily at 6 PM.   ferrous gluconate (FERGON) 324 MG tablet Take 324 mg by mouth daily with breakfast.   furosemide (LASIX) 20 MG tablet Take 20 mg by mouth daily.   metoprolol succinate (TOPROL-XL) 25 MG 24 hr tablet Take 0.5 tablets (12.5 mg total) by mouth daily.   omeprazole (  PRILOSEC) 40 MG capsule Take 40 mg by mouth daily.   potassium chloride (KLOR-CON M) 10 MEQ tablet Take 10 mEq by mouth daily.   sertraline (ZOLOFT) 50 MG tablet Take 50 mg by mouth daily.   spironolactone (ALDACTONE) 25 MG tablet Take 1 tablet (25 mg total) by mouth daily.   nystatin cream (MYCOSTATIN) Apply 1 Application topically daily. (Patient not taking: Reported on 04/11/2023)   No facility-administered encounter medications on file as of  04/11/2023.    Review of Systems  Constitutional: Negative.   HENT: Negative.    Respiratory: Negative.    Cardiovascular:  Positive for leg swelling.  Gastrointestinal: Negative.   Endocrine: Negative.   Genitourinary:  Positive for frequency.  Musculoskeletal: Negative.   Skin: Negative.   Allergic/Immunologic: Negative.   Neurological:  Positive for speech difficulty.  Hematological: Negative.   Psychiatric/Behavioral: Negative.       Immunization History  Administered Date(s) Administered   Influenza Inj Mdck Quad Pf 11/17/2020, 11/22/2021   Influenza-Unspecified 12/27/2016, 12/08/2018, 12/28/2019   Moderna Sars-Covid-2 Vaccination 03/15/2019, 04/12/2019, 01/10/2020   Pneumococcal Polysaccharide-23 03/27/2012, 04/02/2013   Zoster, Live 03/23/2015   Pertinent  Health Maintenance Due  Topic Date Due   DEXA SCAN  Never done   INFLUENZA VACCINE  09/29/2022      05/05/2015    9:18 AM 06/02/2015   10:21 AM 12/07/2022    2:30 PM 01/18/2023    1:20 PM  Fall Risk  Falls in the past year? Yes No 0 0  Was there an injury with Fall? Yes  0 0  Fall Risk Category Calculator   0 0  Patient at Risk for Falls Due to    No Fall Risks  Fall risk Follow up Falls prevention discussed   Falls evaluation completed   Functional Status Survey:    Vitals:   04/11/23 0901  BP: 129/70  Pulse: 65  Resp: 20  Temp: 97.6 F (36.4 C)  SpO2: 90%  Weight: 163 lb 3.2 oz (74 kg)  Height: 5\' 3"  (1.6 m)   Body mass index is 28.91 kg/m. Physical Exam Constitutional:      Appearance: Normal appearance.  HENT:     Head: Normocephalic and atraumatic.     Right Ear: External ear normal.     Left Ear: External ear normal.     Nose: Nose normal.     Mouth/Throat:     Mouth: Mucous membranes are moist.     Pharynx: Oropharynx is clear.  Eyes:     Conjunctiva/sclera: Conjunctivae normal.  Cardiovascular:     Rate and Rhythm: Normal rate and regular rhythm.     Pulses:          Dorsalis  pedis pulses are 1+ on the right side and 1+ on the left side.     Heart sounds: Normal heart sounds.  Pulmonary:     Effort: Pulmonary effort is normal.     Breath sounds: Normal breath sounds.  Abdominal:     General: Bowel sounds are normal.     Palpations: Abdomen is soft.  Musculoskeletal:     Cervical back: Neck supple.     Right lower leg: Edema present.     Left lower leg: Edema present.     Right foot: Swelling present.     Left foot: Swelling present.  Feet:     Right foot:     Skin integrity: Callus and dry skin present.     Toenail Condition: Fungal disease  present.    Left foot:     Skin integrity: Callus and dry skin present.     Toenail Condition: Fungal disease present. Skin:    General: Skin is warm and dry.     Capillary Refill: Capillary refill takes 2 to 3 seconds.     Findings: Abrasion, bruising and erythema present.     Comments: Small abrasion from scratching to right cheek. Generalized bruising noted. BLE swelling, warm to touch, purple and red in color with erythema (not painful to touch) to right lower leg.  Neurological:     General: No focal deficit present.     Mental Status: She is alert. Mental status is at baseline.     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        Thought Content: Thought content normal.    Labs reviewed: Recent Labs    02/12/23 0345 02/14/23 0558 02/15/23 0601 02/16/23 0000  NA 136 135 134* 137  K 3.4* 3.6 4.0 4.2  CL 105 104 103 103  CO2 22 25 25  26*  GLUCOSE 132* 95 85  --   BUN 37* 25* 26* 22*  CREATININE 0.72 0.67 0.68 0.6  CALCIUM 8.2* 8.2* 8.4* 7.9*   Recent Labs    02/08/23 0320 02/10/23 0640 02/11/23 0513  AST 20 16 15   ALT 11 9 13   ALKPHOS 96 103 102  BILITOT 1.5* 1.0 0.7  PROT 6.0* 6.0* 5.4*  ALBUMIN 2.4* 2.3* 2.1*   Recent Labs    02/07/23 1320 02/08/23 0320 02/10/23 0640 02/11/23 0513 02/12/23 0345 02/16/23 0000 02/23/23 0000  WBC  19.0*   < > 11.3* 10.7* 11.3* 9.8 6.3  NEUTROABS 16.0*  --   --   --   --  7,438.00 4,196.00  HGB 14.2   < > 13.0 11.8* 12.0 12.4 12.1  HCT 41.0   < > 37.7 34.5* 35.7* 39 38  MCV 88.7   < > 90.6 89.8 93.7  --   --   PLT 145*   < > 146* 169 207 288 200   < > = values in this interval not displayed.   Lab Results  Component Value Date   TSH 2.29 12/12/2022   Lab Results  Component Value Date   HGBA1C 6.0 (H) 12/12/2022   Lab Results  Component Value Date   CHOL 104 12/12/2022   HDL 33 (L) 12/12/2022   LDLCALC 59 12/12/2022   TRIG 42 12/12/2022   CHOLHDL 3.2 12/12/2022    Significant Diagnostic Results in last 30 days:  No results found.  Assessment/Plan  1. Chronic congestive heart failure, unspecified heart failure type (HCC) (Primary) - Stable, continue Lasix 20 mg daily, Toprol XL 12.5 mg daily, and spironolactone 25 mg daily - Continue potassium chloride 10 meq daily for hypokalemia prevention due to use of 2 diuretics - BLE swelling present, non pitting and chronic  - Patient advised to keep BLE elevated when seated in recliner.   2. Current use of long term anticoagulation - due to a fib, On Eliquis 5 mg BID - Generalized bruising present which is stable.  - Denies any issues with bleeding - Denies bloody stools and hematuria  3. Hyperlipidemia, mixed - Stable, LDL 59 and cholesterol 104 from 12/12/22 - Recheck lipid profile annually - Continue Lipitor 40 mg daily  4. Moderate dementia without behavioral disturbance, psychotic disturbance, mood disturbance, or anxiety, unspecified dementia type (  HCC) - Stable, patient able to answer all questions appropriately. Alert and oriented to person, place, and situation. - Shared stories of children and husband - Continue Zoloft 50 mg daily  5. Mycotic toenails - Recently trimmed by provider - Patient denies any pain to bilateral feet - All 10 toenails mycotic, thick, short and yellow- pt denies any discomfort -  Continue to monitor for any adverse symptoms  6. Essential hypertension - Stable, BP 129/70 - Continue Toprol XL 12.5 mg daily - Continue Lasix 20 mg daily - Continue spironolactone 25 mg daily  7. Apraxia - Stable, this is her baseline - Difficulty formulating/ recollection of certain words, overall able to communicate clearly  8. A fib Rate controlled. Continue current regimen   Lenord Fellers, DNP AGPCNP Student -I personally was present during the history, physical exam and medical decision-making activities of this service and have verified that the service and findings are accurately documented in the student's note  Moana Munford K. Biagio Borg [facility] [phone]

## 2023-04-26 ENCOUNTER — Ambulatory Visit: Payer: Medicare Other | Admitting: Student

## 2023-05-03 ENCOUNTER — Non-Acute Institutional Stay (SKILLED_NURSING_FACILITY): Payer: Self-pay | Admitting: Student

## 2023-05-03 ENCOUNTER — Encounter: Payer: Self-pay | Admitting: Student

## 2023-05-03 DIAGNOSIS — R532 Functional quadriplegia: Secondary | ICD-10-CM

## 2023-05-03 DIAGNOSIS — J690 Pneumonitis due to inhalation of food and vomit: Secondary | ICD-10-CM | POA: Diagnosis not present

## 2023-05-03 DIAGNOSIS — I5020 Unspecified systolic (congestive) heart failure: Secondary | ICD-10-CM | POA: Diagnosis not present

## 2023-05-03 LAB — CBC AND DIFFERENTIAL
HCT: 43 (ref 36–46)
Hemoglobin: 14 (ref 12.0–16.0)
Neutrophils Absolute: 4740
Platelets: 179 10*3/uL (ref 150–400)
WBC: 6

## 2023-05-03 LAB — COMPREHENSIVE METABOLIC PANEL
Albumin: 3.3 — AB (ref 3.5–5.0)
Calcium: 8.3 — AB (ref 8.7–10.7)
Globulin: 3.1
eGFR: 76

## 2023-05-03 LAB — BASIC METABOLIC PANEL
BUN: 19 (ref 4–21)
CO2: 25 — AB (ref 13–22)
Chloride: 105 (ref 99–108)
Creatinine: 0.7 (ref 0.5–1.1)
Glucose: 103
Potassium: 4.7 meq/L (ref 3.5–5.1)
Sodium: 135 — AB (ref 137–147)

## 2023-05-03 LAB — HEPATIC FUNCTION PANEL
ALT: 7 U/L (ref 7–35)
AST: 15 (ref 13–35)
Alkaline Phosphatase: 183 — AB (ref 25–125)

## 2023-05-03 LAB — CBC: RBC: 4.53 (ref 3.87–5.11)

## 2023-05-03 NOTE — Progress Notes (Signed)
 Location:  Other [facility].  Nursing Home Room Number: [facility] [address]A Place of Service:  SNF [phone]) Provider:  Earnestine Mealing, MD  Patient Care Team: Earnestine Mealing, MD as PCP - General (Family Medicine) Delma Freeze, FNP as Nurse Practitioner (Family Medicine) Iran Ouch, MD as Consulting Physician (Cardiology)  Extended Emergency Contact Information Primary Emergency Contact: [name],[name] Address: [address]          [address]          [place], [place] [address] [place] of [place] Home Phone: [phone] Mobile Phone: [phone] Relation: Daughter Secondary Emergency Contact: Azara, Gemme Mobile Phone: [phone] Relation: Son  Code Status:  DNR Goals of care: Advanced Directive information    04/11/2023    9:05 AM  Advanced Directives  Does Patient Have a Medical Advance Directive? Yes  Type of Advance Directive Out of facility DNR (pink MOST or yellow form)  Does patient want to make changes to medical advance directive? No - Patient declined     Chief Complaint  Patient presents with   Medical Management of Chronic Issues    Medical Management of Chronic Issues.     HPI:  Pt is a 89 y.o. female seen today for medical management of chronic diseases.   History of Present Illness The patient presents with cold symptoms. She is accompanied by her daughter, who noticed her cold symptoms over the phone.  She is currently on two liters of oxygen therapy. No shortness of breath is reported. She has not had breakfast yet.  She is not interested in going to the hospital if her condition worsens.  Past Medical History - Bronchi in bilateral lower lobes  Medications - 2 liters of oxygen  Physical Exam CHEST: Bronchi and decreased breath sounds in bilateral lower lobes.  Results RADIOLOGY Chest x-ray: Bronchi in bilateral lower lobes, decreased breath sounds (05/03/2023)  Assessment and Plan   Past Medical History:  Diagnosis  Date   Arthritis    CHF (congestive heart failure) (HCC)    Chronic atrial fibrillation (HCC)    Coronary artery disease    Non-ST elevation myocardial infarction in February 2017. Cardiac catheterization showed significant two-vessel coronary artery disease affecting the right coronary artery and left circumflex with no significant disease involving the LAD. Ejection fraction was 30-35% with akinesis of the mid to distal and apical segments consistent with stress-induced cardiomyopathy.   HTN (hypertension)    Hyperlipidemia    MI (myocardial infarction) [facility])    Peripheral neuralgia    Past Surgical History:  Procedure Laterality Date   APPENDECTOMY     CARDIAC CATHETERIZATION N/A 04/23/2015   Procedure: Left Heart Cath and Coronary Angiography;  Surgeon: Iran Ouch, MD;  Location: [facility] INVASIVE CV LAB;  Service: Cardiovascular;  Laterality: N/A;   LUMBAR SPINE SURGERY      No Known Allergies  Outpatient Encounter Medications as of 05/03/2023  Medication Sig   apixaban (ELIQUIS) 5 MG TABS tablet Take 1 tablet (5 mg total) by mouth 2 (two) times daily.   atorvastatin (LIPITOR) 40 MG tablet Take 1 tablet (40 mg total) by mouth daily at 6 PM.   ferrous gluconate (FERGON) 324 MG tablet Take 324 mg by mouth daily with breakfast.   furosemide (LASIX) 20 MG tablet Take 20 mg by mouth daily.   metoprolol succinate (TOPROL-XL) 25 MG 24 hr tablet Take 0.5 tablets (12.5 mg total) by mouth daily.   omeprazole (PRILOSEC) 40 MG capsule Take 40 mg  by mouth daily.   OXYGEN Inhale into the lungs. 2lpm   potassium chloride (KLOR-CON M) 10 MEQ tablet Take 10 mEq by mouth daily.   sertraline (ZOLOFT) 50 MG tablet Take 50 mg by mouth daily.   spironolactone (ALDACTONE) 25 MG tablet Take 1 tablet (25 mg total) by mouth daily.   nystatin cream (MYCOSTATIN) Apply 1 Application topically daily. (Patient not taking: Reported on 05/03/2023)   No facility-administered encounter medications on file as of  05/03/2023.    Review of Systems  Immunization History  Administered Date(s) Administered   Influenza Inj Mdck Quad Pf 11/17/2020, 11/22/2021   Influenza-Unspecified 12/27/2016, 12/08/2018, 12/28/2019   Moderna Sars-Covid-2 Vaccination 03/15/2019, 04/12/2019, 01/10/2020   Pneumococcal Polysaccharide-23 03/27/2012, 04/02/2013   Zoster, Live 03/23/2015   Pertinent  Health Maintenance Due  Topic Date Due   DEXA SCAN  Never done   INFLUENZA VACCINE  09/29/2022      05/05/2015    9:18 AM 06/02/2015   10:21 AM 12/07/2022    2:30 PM 01/18/2023    1:20 PM  Fall Risk  Falls in the past year? Yes No 0 0  Was there an injury with Fall? Yes  0 0  Fall Risk Category Calculator   0 0  Patient at Risk for Falls Due to    No Fall Risks  Fall risk Follow up Falls prevention discussed   Falls evaluation completed   Functional Status Survey:    Vitals:   05/03/23 1106  BP: 135/68  Pulse: 83  Resp: 18  Temp: (!) 97.3 F (36.3 C)  SpO2: (!) 77%  Weight: 166 lb 12.8 oz (75.7 kg)  Height: 5\' 3"  (1.6 m)   Body mass index is 29.55 kg/m. Physical Exam Constitutional:      Appearance: Normal appearance.  Cardiovascular:     Rate and Rhythm: Normal rate and regular rhythm.     Pulses: Normal pulses.     Heart sounds: Normal heart sounds.  Pulmonary:     Comments: 2LNC Decreased breath sounds bilaterally Abdominal:     General: Abdomen is flat. Bowel sounds are normal.     Palpations: Abdomen is soft.  Musculoskeletal:        General: No swelling or tenderness.  Skin:    General: Skin is warm and dry.  Neurological:     Mental Status: She is alert and oriented to person, place, and time.  Psychiatric:        Mood and Affect: Mood normal.     Labs reviewed: Recent Labs    02/12/23 0345 02/14/23 0558 02/15/23 0601 02/16/23 0000  NA 136 135 134* 137  K 3.4* 3.6 4.0 4.2  CL 105 104 103 103  CO2 22 25 25  26*  GLUCOSE 132* 95 85  --   BUN 37* 25* 26* 22*  CREATININE 0.72  0.67 0.68 0.6  CALCIUM 8.2* 8.2* 8.4* 7.9*   Recent Labs    02/08/23 0320 02/10/23 0640 02/11/23 0513  AST 20 16 15   ALT 11 9 13   ALKPHOS 96 103 102  BILITOT 1.5* 1.0 0.7  PROT 6.0* 6.0* 5.4*  ALBUMIN 2.4* 2.3* 2.1*   Recent Labs    02/07/23 1320 02/08/23 0320 02/10/23 0640 02/11/23 0513 02/12/23 0345 02/16/23 0000 02/23/23 0000  WBC 19.0*   < > 11.3* 10.7* 11.3* 9.8 6.3  NEUTROABS 16.0*  --   --   --   --  7,438.00 4,196.00  HGB 14.2   < > 13.0 11.8*  12.0 12.4 12.1  HCT 41.0   < > 37.7 34.5* 35.7* 39 38  MCV 88.7   < > 90.6 89.8 93.7  --   --   PLT 145*   < > 146* 169 207 288 200   < > = values in this interval not displayed.   Lab Results  Component Value Date   TSH 2.29 12/12/2022   Lab Results  Component Value Date   HGBA1C 6.0 (H) 12/12/2022   Lab Results  Component Value Date   CHOL 104 12/12/2022   HDL 33 (L) 12/12/2022   LDLCALC 59 12/12/2022   TRIG 42 12/12/2022   CHOLHDL 3.2 12/12/2022    Significant Diagnostic Results in last 30 days:  CXRFindings: There are infiltrates noted in the right perihilar area. The heart is enlarged. There are no acute fractures or pneumothorax.  Assessment/Plan Aspiration Pneumonia Hx of CHF Hx of Dementia Hx of functional quadriplegia Presents with cold symptoms, on 2L oxygen. Auscultation reveals bronchi in bilateral lower lobes and decreased breath sounds. Denies dyspnea. Prefers to avoid hospitalization if condition worsens. - Order blood collection and chest x-ray - Inform daughter of condition - Prescribe antitussive medication - Start Augmentin 875-125 mg BID x 5 days   Goals of Care Prefers to avoid hospitalization if condition worsens. Discussed to ensure wishes are respected. - Document preference in medical record.  Family/ staff Communication: Nursing, daughter  Labs/tests ordered:  CBC, CXR  I spent greater than 30  minutes for the care of this patient in face to face time, chart review, clinical  documentation, patient education. I spent an additional 16 minutes discussing goals of care and advanced care planning.

## 2023-05-06 ENCOUNTER — Encounter: Payer: Self-pay | Admitting: Student

## 2023-05-18 ENCOUNTER — Encounter: Payer: Self-pay | Admitting: Nurse Practitioner

## 2023-05-18 ENCOUNTER — Non-Acute Institutional Stay (SKILLED_NURSING_FACILITY): Payer: Self-pay | Admitting: Nurse Practitioner

## 2023-05-18 DIAGNOSIS — I5020 Unspecified systolic (congestive) heart failure: Secondary | ICD-10-CM | POA: Diagnosis not present

## 2023-05-18 NOTE — Progress Notes (Unsigned)
 Location:  Other [facility].  Nursing Home Room Number: [facility] [address] Place of Service:  SNF [phone]) Abbey Chatters, NP  PCP: Earnestine Mealing, MD  Patient Care Team: Earnestine Mealing, MD as PCP - General (Family Medicine) Delma Freeze, FNP as Nurse Practitioner (Family Medicine) Iran Ouch, MD as Consulting Physician (Cardiology)  Extended Emergency Contact Information Primary Emergency Contact: [name],[name] Address: [address]          [address]          [place], [place] [address] [place] of [place] Home Phone: [phone] Mobile Phone: [phone] Relation: Daughter Secondary Emergency Contact: Tnia, Anglada Mobile Phone: [phone] Relation: Son  Goals of care: Advanced Directive information    04/11/2023    9:05 AM  Advanced Directives  Does Patient Have a Medical Advance Directive? Yes  Type of Advance Directive Out of facility DNR (pink MOST or yellow form)  Does patient want to make changes to medical advance directive? No - Patient declined     Chief Complaint  Patient presents with   Weight Gain    Weight Gain and Lower Extremity Edema    HPI:  Pt is a 89 y.o. female seen today for an acute visit for Weight Gain and Lower Extremity Edema Pt with hx of CHF. A fib, htn, hyperlipidemia.  Nursing has been doing daily wieights and reported a 3.8 lb weight gain Also noted O2 sats dropped less than 90 and worsening LE edema She is very sedentary and sits and sleeps in her chair.  Pt denies worsening shortness or breath, cough or congestion   Past Medical History:  Diagnosis Date   Arthritis    CHF (congestive heart failure) (HCC)    Chronic atrial fibrillation (HCC)    Coronary artery disease    Non-ST elevation myocardial infarction in February 2017. Cardiac catheterization showed significant two-vessel coronary artery disease affecting the right coronary artery and left circumflex with no significant disease involving the LAD.  Ejection fraction was 30-35% with akinesis of the mid to distal and apical segments consistent with stress-induced cardiomyopathy.   HTN (hypertension)    Hyperlipidemia    MI (myocardial infarction) [facility])    Peripheral neuralgia    Past Surgical History:  Procedure Laterality Date   APPENDECTOMY     CARDIAC CATHETERIZATION N/A 04/23/2015   Procedure: Left Heart Cath and Coronary Angiography;  Surgeon: Iran Ouch, MD;  Location: [facility] INVASIVE CV LAB;  Service: Cardiovascular;  Laterality: N/A;   LUMBAR SPINE SURGERY      No Known Allergies  Outpatient Encounter Medications as of 05/18/2023  Medication Sig   apixaban (ELIQUIS) 5 MG TABS tablet Take 1 tablet (5 mg total) by mouth 2 (two) times daily.   atorvastatin (LIPITOR) 40 MG tablet Take 1 tablet (40 mg total) by mouth daily at 6 PM.   ferrous gluconate (FERGON) 324 MG tablet Take 324 mg by mouth daily with breakfast.   furosemide (LASIX) 20 MG tablet Take 20 mg by mouth daily.   metoprolol succinate (TOPROL-XL) 25 MG 24 hr tablet Take 0.5 tablets (12.5 mg total) by mouth daily.   omeprazole (PRILOSEC) 40 MG capsule Take 40 mg by mouth daily.   potassium chloride (KLOR-CON M) 10 MEQ tablet Take 10 mEq by mouth daily.   sertraline (ZOLOFT) 50 MG tablet Take 50 mg by mouth daily.   spironolactone (ALDACTONE) 25 MG tablet Take 1 tablet (25 mg total) by mouth daily.   nystatin cream (MYCOSTATIN) Apply  1 Application topically daily. (Patient not taking: Reported on 04/11/2023)   OXYGEN Inhale into the lungs. 2lpm (Patient not taking: Reported on 05/18/2023)   No facility-administered encounter medications on file as of 05/18/2023.    Review of Systems  Constitutional:  Negative for activity change (sedentary), appetite change, fatigue and unexpected weight change.  HENT:  Negative for congestion and hearing loss.   Eyes: Negative.   Respiratory:  Negative for cough and shortness of breath.   Cardiovascular:  Positive for leg  swelling. Negative for chest pain and palpitations.  Gastrointestinal:  Negative for abdominal pain, constipation and diarrhea.  Genitourinary:  Negative for difficulty urinating and dysuria.  Musculoskeletal:  Negative for arthralgias and myalgias.  Skin:  Negative for color change and wound.  Neurological:  Positive for weakness.    Immunization History  Administered Date(s) Administered   Influenza Inj Mdck Quad Pf 11/17/2020, 11/22/2021   Influenza-Unspecified 12/27/2016, 12/08/2018, 12/28/2019   Moderna Sars-Covid-2 Vaccination 03/15/2019, 04/12/2019, 01/10/2020   Pneumococcal Polysaccharide-23 03/27/2012, 04/02/2013   Zoster, Live 03/23/2015   Pertinent  Health Maintenance Due  Topic Date Due   DEXA SCAN  Never done   INFLUENZA VACCINE  05/29/2023 (Originally 09/29/2022)      05/05/2015    9:18 AM 06/02/2015   10:21 AM 12/07/2022    2:30 PM 01/18/2023    1:20 PM  Fall Risk  Falls in the past year? Yes No 0 0  Was there an injury with Fall? Yes  0 0  Fall Risk Category Calculator   0 0  Patient at Risk for Falls Due to    No Fall Risks  Fall risk Follow up Falls prevention discussed   Falls evaluation completed   Functional Status Survey:    Vitals:   05/18/23 1253  BP: 116/72  Pulse: 71  Resp: 20  Temp: (!) 97.1 F (36.2 C)  SpO2: (!) 87%  Weight: 174 lb 12.8 oz (79.3 kg)  Height: 5\' 3"  (1.6 m)   Body mass index is 30.96 kg/m. Physical Exam Constitutional:      General: She is not in acute distress.    Appearance: She is well-developed. She is not diaphoretic.  HENT:     Head: Normocephalic and atraumatic.     Mouth/Throat:     Pharynx: No oropharyngeal exudate.  Eyes:     Conjunctiva/sclera: Conjunctivae normal.     Pupils: Pupils are equal, round, and reactive to light.  Cardiovascular:     Rate and Rhythm: Normal rate and regular rhythm.     Heart sounds: Normal heart sounds.  Pulmonary:     Effort: Pulmonary effort is normal.     Breath sounds:  Normal breath sounds.  Abdominal:     General: Bowel sounds are normal.     Palpations: Abdomen is soft.  Musculoskeletal:     Cervical back: Normal range of motion and neck supple.     Right lower leg: Edema present.     Left lower leg: Edema present.  Skin:    General: Skin is warm and dry.  Neurological:     Mental Status: She is alert.  Psychiatric:        Mood and Affect: Mood normal.     Labs reviewed: Recent Labs    02/12/23 0345 02/14/23 0558 02/15/23 0601 02/16/23 0000 05/03/23 0000  NA 136 135 134* 137 135*  K 3.4* 3.6 4.0 4.2 4.7  CL 105 104 103 103 105  CO2 22 25 25  26*  25*  GLUCOSE 132* 95 85  --   --   BUN 37* 25* 26* 22* 19  CREATININE 0.72 0.67 0.68 0.6 0.7  CALCIUM 8.2* 8.2* 8.4* 7.9* 8.3*   Recent Labs    02/08/23 0320 02/10/23 0640 02/11/23 0513 05/03/23 0000  AST 20 16 15 15   ALT 11 9 13 7   ALKPHOS 96 103 102 183*  BILITOT 1.5* 1.0 0.7  --   PROT 6.0* 6.0* 5.4*  --   ALBUMIN 2.4* 2.3* 2.1* 3.3*   Recent Labs    02/10/23 0640 02/11/23 0513 02/12/23 0345 02/16/23 0000 02/23/23 0000 05/03/23 0000  WBC 11.3* 10.7* 11.3* 9.8 6.3 6.0  NEUTROABS  --   --   --  7,438.00 4,196.00 4,740.00  HGB 13.0 11.8* 12.0 12.4 12.1 14.0  HCT 37.7 34.5* 35.7* 39 38 43  MCV 90.6 89.8 93.7  --   --   --   PLT 146* 169 207 288 200 179   Lab Results  Component Value Date   TSH 2.29 12/12/2022   Lab Results  Component Value Date   HGBA1C 6.0 (H) 12/12/2022   Lab Results  Component Value Date   CHOL 104 12/12/2022   HDL 33 (L) 12/12/2022   LDLCALC 59 12/12/2022   TRIG 42 12/12/2022   CHOLHDL 3.2 12/12/2022    Significant Diagnostic Results in last 30 days:  No results found.  Assessment/Plan 1. Systolic congestive heart failure, unspecified HF chronicity (HCC) (Primary) -will increase lasix to 40 mg daily for 3 days then resume lasix 20 mg daily -continues on aldactone 25 mg daily -O2 was placed on pt at 2L with improvement in O2 level.   Continue O2 to keep sats over 90 -BMP, BNP next lab day Compression hose to Le due to edema during the day.   Janene Harvey. Biagio Borg [facility] [phone]

## 2023-05-22 LAB — CBC AND DIFFERENTIAL
HCT: 39 (ref 36–46)
Hemoglobin: 12.1 (ref 12.0–16.0)
Neutrophils Absolute: 6367
Platelets: 158 10*3/uL (ref 150–400)
WBC: 8.5

## 2023-05-22 LAB — BASIC METABOLIC PANEL WITH GFR
BUN: 24 — AB (ref 4–21)
CO2: 28 — AB (ref 13–22)
Chloride: 102 (ref 99–108)
Creatinine: 0.8 (ref 0.5–1.1)
Glucose: 83
Potassium: 4.2 meq/L (ref 3.5–5.1)
Sodium: 135 — AB (ref 137–147)

## 2023-05-22 LAB — COMPREHENSIVE METABOLIC PANEL WITH GFR
Calcium: 8 — AB (ref 8.7–10.7)
eGFR: 66

## 2023-05-22 LAB — CBC: RBC: 4 (ref 3.87–5.11)

## 2023-05-29 ENCOUNTER — Ambulatory Visit: Payer: Self-pay | Admitting: Student

## 2023-05-29 NOTE — Telephone Encounter (Signed)
 Additional Notes: Spoke with [facility], LPN nurse. Last week pt bumped both arms in the bathroom. Pt has a bruise on right elbow. Pt may have also hurt her left wrist. Pt had an order from Dr. Sydnee Cabal that said Tylenol 650 mg q4 hr for up to 72 hours and the order has expired Dr. Sydnee Cabal. Pt is currently in 7/10 pain and the LPN is requesting another order. This RN called the on call provider, Synthia Innocent, who stated it was okay for this order to be put in again. This RN let the LPN know this.    Copied from CRM [id number]. Topic: Clinical - Red Word Triage >> May 29, 2023  5:50 PM Antony Haste wrote: Red Word that prompted transfer to Nurse Triage: [facility] LPN - Swelling and scab on left arm from accidental injury from last Thursday. The caller states she may have hit her arm on the door while going to the restroom but they are not entirely sure. Reason for Disposition  [1] Caller requesting NON-URGENT health information AND [2] PCP's office is the best resource  Answer Assessment - Initial Assessment Questions 1. REASON FOR CALL or QUESTION: "What is your reason for calling today?" or "How can I best help you?" or "What question do you have that I can help answer?"     Medication  Protocols used: Information Only Call - No Triage-A-AH

## 2023-05-30 NOTE — Telephone Encounter (Signed)
 See Triage Notes from Triage Nurse FYI. Message sent to Earnestine Mealing, MD

## 2023-06-15 ENCOUNTER — Encounter: Payer: Self-pay | Admitting: Nurse Practitioner

## 2023-06-15 ENCOUNTER — Non-Acute Institutional Stay: Admitting: Nurse Practitioner

## 2023-06-15 DIAGNOSIS — Z0000 Encounter for general adult medical examination without abnormal findings: Secondary | ICD-10-CM

## 2023-06-15 NOTE — Progress Notes (Signed)
 Subjective:   Kathy Lowery is a 89 y.o. female who presents for Medicare Annual (Subsequent) preventive examination.  Visit Complete: In person [facility]    Cardiac Risk Factors include: advanced age (>15men, >20 women);sedentary lifestyle;hypertension;dyslipidemia;obesity (BMI >30kg/m2)     Objective:    Today's Vitals   06/15/23 1045  BP: 135/64  Pulse: 62  Resp: 16  Temp: 98.2 F (36.8 C)  SpO2: 94%  Weight: 171 lb 9.6 oz (77.8 kg)  Height: 5\' 3"  (1.6 m)   Body mass index is 30.4 kg/m.     06/15/2023   10:52 AM 04/11/2023    9:05 AM 03/31/2023   10:32 AM 03/17/2023    9:16 AM 02/17/2023    8:53 AM 02/14/2023    9:00 AM 02/07/2023    1:47 PM  Advanced Directives  Does Patient Have a Medical Advance Directive? Yes Yes Yes Yes Yes  Yes  Type of Advance Directive Out of facility DNR (pink MOST or yellow form) Out of facility DNR (pink MOST or yellow form) Living will;Out of facility DNR (pink MOST or yellow form) Out of facility DNR (pink MOST or yellow form) Out of facility DNR (pink MOST or yellow form) Healthcare Power of Attorney   Does patient want to make changes to medical advance directive? No - Patient declined No - Patient declined No - Patient declined No - Patient declined No - Patient declined  Yes (ED - Information included in AVS)  Copy of Healthcare Power of Attorney in Chart?      No - copy requested     Current Medications (verified) Outpatient Encounter Medications as of 06/15/2023  Medication Sig   apixaban (ELIQUIS) 5 MG TABS tablet Take 1 tablet (5 mg total) by mouth 2 (two) times daily.   atorvastatin (LIPITOR) 40 MG tablet Take 1 tablet (40 mg total) by mouth daily at 6 PM.   ferrous gluconate (FERGON) 324 MG tablet Take 324 mg by mouth daily with breakfast.   furosemide (LASIX) 20 MG tablet Take 20 mg by mouth daily.   metoprolol succinate (TOPROL-XL) 25 MG 24 hr tablet Take 0.5 tablets (12.5 mg total) by mouth daily.   omeprazole (PRILOSEC) 20  MG capsule Take 20 mg by mouth daily.   potassium chloride (KLOR-CON M) 10 MEQ tablet Take 10 mEq by mouth daily.   sertraline (ZOLOFT) 50 MG tablet Take 50 mg by mouth daily.   spironolactone (ALDACTONE) 25 MG tablet Take 1 tablet (25 mg total) by mouth daily.   Zinc Oxide (TRIPLE PASTE) 12.8 % ointment Apply 1 Application topically as needed for irritation.   nystatin cream (MYCOSTATIN) Apply 1 Application topically daily. (Patient not taking: Reported on 06/15/2023)   OXYGEN Inhale into the lungs. 2lpm (Patient not taking: Reported on 06/15/2023)   No facility-administered encounter medications on file as of 06/15/2023.    Allergies (verified) Patient has no known allergies.   History: Past Medical History:  Diagnosis Date   Arthritis    CHF (congestive heart failure) (HCC)    Chronic atrial fibrillation (HCC)    Coronary artery disease    Non-ST elevation myocardial infarction in February 2017. Cardiac catheterization showed significant two-vessel coronary artery disease affecting the right coronary artery and left circumflex with no significant disease involving the LAD. Ejection fraction was 30-35% with akinesis of the mid to distal and apical segments consistent with stress-induced cardiomyopathy.   HTN (hypertension)    Hyperlipidemia    MI (myocardial infarction) (HCC)  Peripheral neuralgia    Past Surgical History:  Procedure Laterality Date   APPENDECTOMY     CARDIAC CATHETERIZATION N/A 04/23/2015   Procedure: Left Heart Cath and Coronary Angiography;  Surgeon: Wenona Hamilton, MD;  Location: [facility] INVASIVE CV LAB;  Service: Cardiovascular;  Laterality: N/A;   LUMBAR SPINE SURGERY     Family History  Problem Relation Age of Onset   CAD Father    Hypertension Father    Tuberculosis Mother    Social History   Socioeconomic History   Marital status: Widowed    Spouse name: Not on file   Number of children: Not on file   Years of education: Not on file   Highest  education level: Not on file  Occupational History   Not on file  Tobacco Use   Smoking status: Former    Types: Cigarettes   Smokeless tobacco: Never   Tobacco comments:    About age 37  Vaping Use   Vaping status: Never Used  Substance and Sexual Activity   Alcohol use: No    Alcohol/week: 0.0 standard drinks of alcohol   Drug use: No   Sexual activity: Not Currently  Other Topics Concern   Not on file  Social History Narrative   Not on file   Social Drivers of Health   Financial Resource Strain: Low Risk  (05/31/2022)   Received from [facility]   Overall Financial Resource Strain (CARDIA)    Difficulty of Paying Living Expenses: Not hard at all  Food Insecurity: Patient Unable To Answer (02/08/2023)   Hunger Vital Sign    Worried About Running Out of Food in the Last Year: Patient unable to answer    Ran Out of Food in the Last Year: Patient unable to answer  Transportation Needs: Patient Unable To Answer (02/08/2023)   PRAPARE - Administrator, Civil Service (Medical): Patient unable to answer    Lack of Transportation (Non-Medical): Patient unable to answer  Physical Activity: Not on file  Stress: Not on file  Social Connections: Not on file    Tobacco Counseling Counseling given: Not Answered Tobacco comments: About age 29   Clinical Intake:  Pre-visit preparation completed: Yes  Pain : No/denies pain     BMI - recorded: 30.4 Nutritional Status: BMI > 30  Obese  How often do you need to have someone help you when you read instructions, pamphlets, or other written materials from your doctor or pharmacy?: 4 - Often         Activities of Daily Living    06/15/2023   12:34 PM 02/14/2023    9:00 AM  In your present state of health, do you have any difficulty performing the following activities:  Hearing? 1 0  Vision? 1 0  Difficulty concentrating or making decisions? 1 1  Walking or climbing stairs? 1   Dressing or  bathing? 1   Doing errands, shopping? 1 0  Preparing Food and eating ? Y   Using the Toilet? Y   In the past six months, have you accidently leaked urine? Y   Do you have problems with loss of bowel control? Y   Managing your Medications? Y   Managing your Finances? Y   Housekeeping or managing your Housekeeping? Y     Patient Care Team: Valrie Gehrig, MD as PCP - General (Family Medicine) Charlette Console, FNP as Nurse Practitioner (Family Medicine) Wenona Hamilton, MD as Consulting Physician (Cardiology)  Indicate any recent Medical Services you may have received from other than [facility] providers in the past year (date may be approximate).     Assessment:   This is a routine wellness examination for Kathy Lowery.  Hearing/Vision screen No results found.   Goals Addressed   None    Depression Screen    12/07/2022    2:30 PM 06/02/2015   10:21 AM 05/05/2015    9:18 AM  PHQ 2/9 Scores  PHQ - 2 Score 0 0 0    Fall Risk    01/18/2023    1:20 PM 12/07/2022    2:30 PM 06/02/2015   10:21 AM 05/05/2015    9:18 AM  Fall Risk   Falls in the past year? 0 0 No Yes  Number falls in past yr: 0 0  1  Injury with Fall? 0 0  Yes  Risk for fall due to : No Fall Risks     Follow up Falls evaluation completed   Falls prevention discussed    MEDICARE RISK AT HOME:    TIMED UP AND GO:  Was the test performed?  No    Cognitive Function:        Immunizations Immunization History  Administered Date(s) Administered   Influenza Inj Mdck Quad Pf 11/17/2020, 11/22/2021   Influenza-Unspecified 12/27/2016, 12/08/2018, 12/28/2019   Moderna Sars-Covid-2 Vaccination 03/15/2019, 04/12/2019, 01/10/2020   Pneumococcal Polysaccharide-23 03/27/2012, 04/02/2013   Zoster, Live 03/23/2015    TDAP status: Due, Education has been provided regarding the importance of this vaccine. Advised may receive this vaccine at local pharmacy or Health Dept. Aware to provide a copy of the vaccination record if  obtained from local pharmacy or Health Dept. Verbalized acceptance and understanding.  Flu Vaccine status: Up to date  Pneumococcal vaccine status: Due, Education has been provided regarding the importance of this vaccine. Advised may receive this vaccine at local pharmacy or Health Dept. Aware to provide a copy of the vaccination record if obtained from local pharmacy or Health Dept. Verbalized acceptance and understanding.  Covid-19 vaccine status: Information provided on how to obtain vaccines.   Qualifies for Shingles Vaccine? Yes   Zostavax completed No   Shingrix Completed?: Yes  Screening Tests Health Maintenance  Topic Date Due   COVID-19 Vaccine (4 - 2024-25 season) 10/30/2022   Zoster Vaccines- Shingrix (1 of 2) 08/18/2023 (Originally 05/23/1974)   DTaP/Tdap/Td (1 - Tdap) 05/17/2024 (Originally 05/23/1943)   Pneumonia Vaccine 2+ Years old (2 of 2 - PCV) 05/17/2024 (Originally 04/02/2014)   INFLUENZA VACCINE  09/29/2023   Medicare Annual Wellness (AWV)  06/14/2024   HPV VACCINES  Aged Out   Meningococcal B Vaccine  Aged Out   DEXA SCAN  Discontinued    Health Maintenance  Health Maintenance Due  Topic Date Due   COVID-19 Vaccine (4 - 2024-25 season) 10/30/2022    Colorectal cancer screening: No longer required.   Mammogram status: No longer required due to age.   Lung Cancer Screening: (Low Dose CT Chest recommended if Age 34-80 years, 20 pack-year currently smoking OR have quit w/in 15years.) does not qualify.   Lung Cancer Screening Referral: na  Additional Screening:  Hepatitis C Screening: does not qualify; Completed   Vision Screening: Recommended annual ophthalmology exams for early detection of glaucoma and other disorders of the eye. Is the patient up to date with their annual eye exam?  No  Who is the provider or what is the name of the office in which the  patient attends annual eye exams? unknown  Dental Screening: Recommended annual dental exams for  proper oral hygiene   Community Resource Referral / Chronic Care Management: CRR required this visit?  No   CCM required this visit?  No     Plan:     I have personally reviewed and noted the following in the patient's chart:   Medical and social history Use of alcohol, tobacco or illicit drugs  Current medications and supplements including opioid prescriptions. Patient is not currently taking opioid prescriptions. Functional ability and status Nutritional status Physical activity Advanced directives List of other physicians Hospitalizations, surgeries, and ER visits in previous 12 months Vitals Screenings to include cognitive, depression, and falls Referrals and appointments  In addition, I have reviewed and discussed with patient certain preventive protocols, quality metrics, and best practice recommendations. A written personalized care plan for preventive services as well as general preventive health recommendations were provided to patient.     Verma Gobble, NP   06/15/2023

## 2023-07-03 LAB — BASIC METABOLIC PANEL WITH GFR
BUN: 33 — AB (ref 4–21)
CO2: 27 — AB (ref 13–22)
Chloride: 103 (ref 99–108)
Creatinine: 0.8 (ref 0.5–1.1)
Glucose: 89
Potassium: 4.4 meq/L (ref 3.5–5.1)
Sodium: 134 — AB (ref 137–147)

## 2023-07-03 LAB — CBC AND DIFFERENTIAL
HCT: 40 (ref 36–46)
Hemoglobin: 12.8 (ref 12.0–16.0)
Neutrophils Absolute: 5901
Platelets: 202 10*3/uL (ref 150–400)
WBC: 8.3

## 2023-07-03 LAB — VITAMIN D 25 HYDROXY (VIT D DEFICIENCY, FRACTURES): Vit D, 25-Hydroxy: 21

## 2023-07-03 LAB — CBC: RBC: 4.23 (ref 3.87–5.11)

## 2023-07-03 LAB — COMPREHENSIVE METABOLIC PANEL WITH GFR
Calcium: 8.5 — AB (ref 8.7–10.7)
eGFR: 68

## 2023-07-12 ENCOUNTER — Encounter: Payer: Self-pay | Admitting: Student

## 2023-07-12 ENCOUNTER — Non-Acute Institutional Stay (SKILLED_NURSING_FACILITY): Payer: Self-pay | Admitting: Student

## 2023-07-12 DIAGNOSIS — I509 Heart failure, unspecified: Secondary | ICD-10-CM | POA: Diagnosis not present

## 2023-07-12 DIAGNOSIS — R532 Functional quadriplegia: Secondary | ICD-10-CM

## 2023-07-12 DIAGNOSIS — F03B0 Unspecified dementia, moderate, without behavioral disturbance, psychotic disturbance, mood disturbance, and anxiety: Secondary | ICD-10-CM

## 2023-07-12 NOTE — Progress Notes (Unsigned)
 Location:  Other [facility].  Nursing Home Room Number: [facility] [address] Place of Service:  SNF [phone]) Provider:  Valrie Gehrig, MD  Patient Care Team: Valrie Gehrig, MD as PCP - General (Family Medicine) Charlette Console, FNP as Nurse Practitioner (Family Medicine) Wenona Hamilton, MD as Consulting Physician (Cardiology)  Extended Emergency Contact Information Primary Emergency Contact: [name],[name] Address: [address]          [address]          [place], [place] [address] [place] Home Phone: [phone] Mobile Phone: [phone] Relation: Daughter Secondary Emergency Contact: Ngoc, Parmeter Mobile Phone: [phone] Relation: Son  Code Status:  DNR Goals of care: Advanced Directive information    06/15/2023   10:52 AM  Advanced Directives  Does Patient Have a Medical Advance Directive? Yes  Type of Advance Directive Out of facility DNR (pink MOST or yellow form)  Does patient want to make changes to medical advance directive? No - Patient declined     Chief Complaint  Patient presents with   Medical Management of Chronic Issues    Medical Management of Chronic Issues.     HPI:  Pt is a 89 y.o. female seen today for medical management of chronic diseases.   History of Present Illness The patient is a 89 year old who presents for a geriatric medicine consultation.  She is generally doing well and has eaten dinner. She is supposed to be on oxygen therapy but was not wearing it during the visit. She is able to walk independently and manage personal care such as going to the bathroom by herself.  She emphasizes the importance of comfort and family in her life, mentioning her children and grandchildren as significant aspects. She recalls her husband's age at the time of his death, which was 66.  No feelings of sadness, being down, or depression.   Past Medical History:  Diagnosis Date   Arthritis    CHF (congestive heart failure) (HCC)     Chronic atrial fibrillation (HCC)    Coronary artery disease    Non-ST elevation myocardial infarction in February 2017. Cardiac catheterization showed significant two-vessel coronary artery disease affecting the right coronary artery and left circumflex with no significant disease involving the LAD. Ejection fraction was 30-35% with akinesis of the mid to distal and apical segments consistent with stress-induced cardiomyopathy.   HTN (hypertension)    Hyperlipidemia    MI (myocardial infarction) [facility])    Peripheral neuralgia    Past Surgical History:  Procedure Laterality Date   APPENDECTOMY     CARDIAC CATHETERIZATION N/A 04/23/2015   Procedure: Left Heart Cath and Coronary Angiography;  Surgeon: Wenona Hamilton, MD;  Location: [facility] INVASIVE CV LAB;  Service: Cardiovascular;  Laterality: N/A;   LUMBAR SPINE SURGERY      No Known Allergies  Outpatient Encounter Medications as of 07/12/2023  Medication Sig   apixaban  (ELIQUIS ) 5 MG TABS tablet Take 1 tablet (5 mg total) by mouth 2 (two) times daily.   atorvastatin  (LIPITOR) 40 MG tablet Take 1 tablet (40 mg total) by mouth daily at 6 PM.   ferrous gluconate (FERGON) 324 MG tablet Take 324 mg by mouth daily with breakfast.   furosemide  (LASIX ) 20 MG tablet Take 20 mg by mouth daily.   metoprolol  succinate (TOPROL -XL) 25 MG 24 hr tablet Take 0.5 tablets (12.5 mg total) by mouth daily.   omeprazole (PRILOSEC) 20 MG capsule Take 20 mg by mouth daily.   potassium  chloride (KLOR-CON  M) 10 MEQ tablet Take 10 mEq by mouth daily.   sertraline  (ZOLOFT ) 50 MG tablet Take 50 mg by mouth daily.   spironolactone  (ALDACTONE ) 25 MG tablet Take 1 tablet (25 mg total) by mouth daily.   Zinc Oxide (TRIPLE PASTE) 12.8 % ointment Apply 1 Application topically as needed for irritation.   nystatin  cream (MYCOSTATIN ) Apply 1 Application topically daily. (Patient not taking: Reported on 04/11/2023)   OXYGEN Inhale into the lungs. 2lpm (Patient not taking:  Reported on 05/18/2023)   No facility-administered encounter medications on file as of 07/12/2023.    Review of Systems  Immunization History  Administered Date(s) Administered   Influenza Inj Mdck Quad Pf 11/17/2020, 11/22/2021   Influenza-Unspecified 12/27/2016, 12/08/2018, 12/28/2019, 03/05/2023   Moderna Sars-Covid-2 Vaccination 03/15/2019, 04/12/2019, 01/10/2020   PNEUMOCOCCAL CONJUGATE-20 03/04/2023   Pneumococcal Polysaccharide-23 03/27/2012, 04/02/2013   Zoster, Live 03/23/2015   Pertinent  Health Maintenance Due  Topic Date Due   INFLUENZA VACCINE  09/29/2023   DEXA SCAN  Discontinued      05/05/2015    9:18 AM 06/02/2015   10:21 AM 12/07/2022    2:30 PM 01/18/2023    1:20 PM  Fall Risk  Falls in the past year? Yes No 0 0  Was there an injury with Fall? Yes  0 0  Fall Risk Category Calculator   0 0  Patient at Risk for Falls Due to    No Fall Risks  Fall risk Follow up Falls prevention discussed   Falls evaluation completed   Functional Status Survey:    Vitals:   07/12/23 0938  BP: (!) 124/58  Pulse: 78  Resp: 17  Temp: (!) 97.2 F (36.2 C)  SpO2: 94%  Weight: 173 lb (78.5 kg)  Height: 5\' 3"  (1.6 m)   Body mass index is 30.65 kg/m. Physical Exam Cardiovascular:     Rate and Rhythm: Normal rate.  Pulmonary:     Effort: Pulmonary effort is normal.  Abdominal:     General: Abdomen is flat.  Skin:    General: Skin is warm and dry.  Neurological:     Mental Status: She is alert. Mental status is at baseline.     Labs reviewed: Recent Labs    02/12/23 0345 02/14/23 0558 02/15/23 0601 02/16/23 0000 05/03/23 0000 05/22/23 0000 07/03/23 0000  NA 136 135 134*   < > 135* 135* 134*  K 3.4* 3.6 4.0   < > 4.7 4.2 4.4  CL 105 104 103   < > 105 102 103  CO2 22 25 25    < > 25* 28* 27*  GLUCOSE 132* 95 85  --   --   --   --   BUN 37* 25* 26*   < > 19 24* 33*  CREATININE 0.72 0.67 0.68   < > 0.7 0.8 0.8  CALCIUM  8.2* 8.2* 8.4*   < > 8.3* 8.0* 8.5*   <  > = values in this interval not displayed.   Recent Labs    02/08/23 0320 02/10/23 0640 02/11/23 0513 05/03/23 0000  AST 20 16 15 15   ALT 11 9 13 7   ALKPHOS 96 103 102 183*  BILITOT 1.5* 1.0 0.7  --   PROT 6.0* 6.0* 5.4*  --   ALBUMIN 2.4* 2.3* 2.1* 3.3*   Recent Labs    02/10/23 0640 02/11/23 0513 02/12/23 0345 02/16/23 0000 05/03/23 0000 05/22/23 0000 07/03/23 0000  WBC 11.3* 10.7* 11.3*   < >  6.0 8.5 8.3  NEUTROABS  --   --   --    < > 4,740.00 6,367.00 5,901.00  HGB 13.0 11.8* 12.0   < > 14.0 12.1 12.8  HCT 37.7 34.5* 35.7*   < > 43 39 40  MCV 90.6 89.8 93.7  --   --   --   --   PLT 146* 169 207   < > 179 158 202   < > = values in this interval not displayed.   Lab Results  Component Value Date   TSH 2.29 12/12/2022   Lab Results  Component Value Date   HGBA1C 6.0 (H) 12/12/2022   Lab Results  Component Value Date   CHOL 104 12/12/2022   HDL 33 (L) 12/12/2022   LDLCALC 59 12/12/2022   TRIG 42 12/12/2022   CHOLHDL 3.2 12/12/2022    Significant Diagnostic Results in last 30 days:  No results found.  Assessment/Plan CHF  Hx of Aspiration pneumonia Oxygen therapy was not in use as prescribed. The reason for discontinuation was not discussed. At 89 years old, she prioritizes comfort and independence, demonstrated by her ability to walk to the bathroom independently. - Ensure adherence to prescribed oxygen therapy.  Dementia Verbal and cognitive deficits. Continue supportive care.   Goals of Care She prioritizes comfort and independence, emphasizing the importance of family and expressing that comfort is paramount. - Discuss and document advanced directives and goals of care with family. Family/ staff Communication: nursing  Labs/tests ordered:  none

## 2023-07-13 ENCOUNTER — Encounter: Payer: Self-pay | Admitting: Student

## 2023-09-07 ENCOUNTER — Encounter: Payer: Self-pay | Admitting: Nurse Practitioner

## 2023-09-07 ENCOUNTER — Non-Acute Institutional Stay (SKILLED_NURSING_FACILITY): Payer: Self-pay | Admitting: Nurse Practitioner

## 2023-09-07 DIAGNOSIS — K219 Gastro-esophageal reflux disease without esophagitis: Secondary | ICD-10-CM

## 2023-09-07 DIAGNOSIS — I10 Essential (primary) hypertension: Secondary | ICD-10-CM

## 2023-09-07 DIAGNOSIS — E782 Mixed hyperlipidemia: Secondary | ICD-10-CM | POA: Diagnosis not present

## 2023-09-07 DIAGNOSIS — I482 Chronic atrial fibrillation, unspecified: Secondary | ICD-10-CM

## 2023-09-07 DIAGNOSIS — I2510 Atherosclerotic heart disease of native coronary artery without angina pectoris: Secondary | ICD-10-CM

## 2023-09-07 DIAGNOSIS — F03B0 Unspecified dementia, moderate, without behavioral disturbance, psychotic disturbance, mood disturbance, and anxiety: Secondary | ICD-10-CM

## 2023-09-07 DIAGNOSIS — E669 Obesity, unspecified: Secondary | ICD-10-CM

## 2023-09-07 DIAGNOSIS — I509 Heart failure, unspecified: Secondary | ICD-10-CM | POA: Diagnosis not present

## 2023-09-07 DIAGNOSIS — B351 Tinea unguium: Secondary | ICD-10-CM

## 2023-09-07 NOTE — Progress Notes (Signed)
 Location:  [facility].  Nursing Home Room Number: [facility] [address] Place of Service:  SNF [phone]) Kathy An, NP  PCP: Kathy Fine, MD  Patient Care Team: Kathy Fine, MD as PCP - General (Family Medicine) Kathy Ellouise LABOR, FNP as Nurse Practitioner (Family Medicine) Kathy Deatrice LABOR, MD as Consulting Physician (Cardiology)  Extended Emergency Contact Information Primary Emergency Contact: Kathy Lowery Address: [address]          [address]          [place], [place] [address] [place] Home Phone: [phone] Mobile Phone: [phone] Relation: Daughter Secondary Emergency Contact: Kathy Lowery Mobile Phone: [phone] Relation: Son  Goals of care: Advanced Directive information    09/07/2023   12:50 PM  Advanced Directives  Does Patient Have a Medical Advance Directive? Yes  Type of Advance Directive Out of facility DNR (pink MOST or yellow form)  Does patient want to make changes to medical advance directive? No - Patient declined     Chief Complaint  Patient presents with   Medical Management of Chronic Issues    Medical Management of Chronic Issues.     HPI:  Pt is a 89 y.o. female seen today for medical management of chronic disease. Pt with hx of CHF,a fib, dementia,  htn, GERD, CAD Pt reports she is doing well. Denies shortness of breath, chest pain, constipation, Staff reports toenails are long, thick and overgrown, requesting to be cut. Podiatrist does not come back for over a month. Pt denies pain to toes.  She is eating well.   Past Medical History:  Diagnosis Date   Arthritis    CHF (congestive heart failure) (HCC)    Chronic atrial fibrillation (HCC)    Coronary artery disease    Non-ST elevation myocardial infarction in February 2017. Cardiac catheterization showed significant two-vessel coronary artery disease affecting the right coronary artery and left circumflex with no significant disease involving  the LAD. Ejection fraction was 30-35% with akinesis of the mid to distal and apical segments consistent with stress-induced cardiomyopathy.   HTN (hypertension)    Hyperlipidemia    MI (myocardial infarction) [facility])    Peripheral neuralgia    Past Surgical History:  Procedure Laterality Date   APPENDECTOMY     CARDIAC CATHETERIZATION N/A 04/23/2015   Procedure: Left Heart Cath and Coronary Angiography;  Surgeon: Deatrice Lowery Darron, MD;  Location: [facility] INVASIVE CV LAB;  Service: Cardiovascular;  Laterality: N/A;   LUMBAR SPINE SURGERY      No Known Allergies  Outpatient Encounter Medications as of 09/07/2023  Medication Sig   apixaban  (ELIQUIS ) 5 MG TABS tablet Take 1 tablet (5 mg total) by mouth 2 (two) times daily.   atorvastatin  (LIPITOR) 40 MG tablet Take 1 tablet (40 mg total) by mouth daily at 6 PM.   ferrous gluconate (FERGON) 324 MG tablet Take 324 mg by mouth daily with breakfast.   furosemide  (LASIX ) 20 MG tablet Take 20 mg by mouth daily.   metoprolol  succinate (TOPROL -XL) 25 MG 24 hr tablet Take 0.5 tablets (12.5 mg total) by mouth daily.   omeprazole (PRILOSEC) 20 MG capsule Take 20 mg by mouth daily.   potassium chloride  (KLOR-CON  M) 10 MEQ tablet Take 10 mEq by mouth daily.   sertraline  (ZOLOFT ) 50 MG tablet Take 50 mg by mouth daily. (Patient taking differently: Take 25 mg by mouth daily.)   spironolactone  (ALDACTONE ) 25 MG tablet Take 1 tablet (25 mg total) by mouth daily.  Zinc Oxide (TRIPLE PASTE) 12.8 % ointment Apply 1 Application topically as needed for irritation.   nystatin  cream (MYCOSTATIN ) Apply 1 Application topically daily. (Patient not taking: Reported on 09/07/2023)   OXYGEN Inhale into the lungs. 2lpm (Patient not taking: Reported on 09/07/2023)   No facility-administered encounter medications on file as of 09/07/2023.    Review of Systems  Constitutional:  Negative for activity change, appetite change, fatigue and unexpected weight change.  HENT:  Negative  for congestion and hearing loss.   Eyes: Negative.   Respiratory:  Negative for cough and shortness of breath.   Cardiovascular:  Negative for chest pain, palpitations and leg swelling.  Gastrointestinal:  Negative for abdominal pain, constipation and diarrhea.  Genitourinary:  Negative for difficulty urinating and dysuria.  Musculoskeletal:  Negative for arthralgias and myalgias.  Skin:  Negative for color change and wound.  Neurological:  Negative for dizziness and weakness.  Psychiatric/Behavioral:  Negative for agitation, behavioral problems and confusion.      Immunization History  Administered Date(s) Administered   Influenza Inj Mdck Quad Pf 11/17/2020, 11/22/2021   Influenza-Unspecified 12/27/2016, 12/08/2018, 12/28/2019, 03/05/2023   Moderna Sars-Covid-2 Vaccination 03/15/2019, 04/12/2019, 01/10/2020   PNEUMOCOCCAL CONJUGATE-20 03/04/2023   Pneumococcal Polysaccharide-23 03/27/2012, 04/02/2013   Zoster, Live 03/23/2015   Pertinent  Health Maintenance Due  Topic Date Due   INFLUENZA VACCINE  09/29/2023   DEXA SCAN  Discontinued      05/05/2015    9:18 AM 06/02/2015   10:21 AM 12/07/2022    2:30 PM 01/18/2023    1:20 PM  Fall Risk  Falls in the past year? Yes  No  0 0  Was there Lowery injury with Fall? Yes   0 0  Fall Risk Category Calculator   0 0  Patient at Risk for Falls Due to    No Fall Risks  Fall risk Follow up Falls prevention discussed    Falls evaluation completed     Data saved with a previous flowsheet row definition   Functional Status Survey:    Vitals:   09/07/23 1246  BP: (!) 158/63  Pulse: (!) 58  Resp: 16  Temp: (!) 94.7 F (34.8 C)  SpO2: 95%  Weight: 184 lb 3.2 oz (83.6 kg)  Height: 5' 3 (1.6 m)   Body mass index is 32.63 kg/m. Physical Exam Constitutional:      General: She is not in acute distress.    Appearance: She is well-developed. She is not diaphoretic.  HENT:     Head: Normocephalic and atraumatic.     Mouth/Throat:      Pharynx: No oropharyngeal exudate.  Eyes:     Conjunctiva/sclera: Conjunctivae normal.     Pupils: Pupils are equal, round, and reactive to light.  Cardiovascular:     Rate and Rhythm: Normal rate and regular rhythm.     Heart sounds: Normal heart sounds.  Pulmonary:     Effort: Pulmonary effort is normal.     Breath sounds: Normal breath sounds.  Abdominal:     General: Bowel sounds are normal.     Palpations: Abdomen is soft.  Musculoskeletal:     Cervical back: Normal range of motion and neck supple.     Right lower leg: No edema.     Left lower leg: No edema.  Skin:    General: Skin is warm and dry.  Neurological:     Mental Status: She is alert. Mental status is at baseline.     Motor:  Weakness present.     Gait: Gait abnormal.  Psychiatric:        Mood and Affect: Mood normal.     Labs reviewed: Recent Labs    02/12/23 0345 02/14/23 0558 02/15/23 0601 02/16/23 0000 05/03/23 0000 05/22/23 0000 07/03/23 0000  NA 136 135 134*   < > 135* 135* 134*  K 3.4* 3.6 4.0   < > 4.7 4.2 4.4  CL 105 104 103   < > 105 102 103  CO2 22 25 25    < > 25* 28* 27*  GLUCOSE 132* 95 85  --   --   --   --   BUN 37* 25* 26*   < > 19 24* 33*  CREATININE 0.72 0.67 0.68   < > 0.7 0.8 0.8  CALCIUM  8.2* 8.2* 8.4*   < > 8.3* 8.0* 8.5*   < > = values in this interval not displayed.   Recent Labs    02/08/23 0320 02/10/23 0640 02/11/23 0513 05/03/23 0000  AST 20 16 15 15   ALT 11 9 13 7   ALKPHOS 96 103 102 183*  BILITOT 1.5* 1.0 0.7  --   PROT 6.0* 6.0* 5.4*  --   ALBUMIN 2.4* 2.3* 2.1* 3.3*   Recent Labs    02/10/23 0640 02/11/23 0513 02/12/23 0345 02/16/23 0000 05/03/23 0000 05/22/23 0000 07/03/23 0000  WBC 11.3* 10.7* 11.3*   < > 6.0 8.5 8.3  NEUTROABS  --   --   --    < > 4,740.00 6,367.00 5,901.00  HGB 13.0 11.8* 12.0   < > 14.0 12.1 12.8  HCT 37.7 34.5* 35.7*   < > 43 39 40  MCV 90.6 89.8 93.7  --   --   --   --   PLT 146* 169 207   < > 179 158 202   < > = values  in this interval not displayed.   Lab Results  Component Value Date   TSH 2.29 12/12/2022   Lab Results  Component Value Date   HGBA1C 6.0 (H) 12/12/2022   Lab Results  Component Value Date   CHOL 104 12/12/2022   HDL 33 (L) 12/12/2022   LDLCALC 59 12/12/2022   TRIG 42 12/12/2022   CHOLHDL 3.2 12/12/2022    Significant Diagnostic Results in last 30 days:  No results found.  Assessment/Plan Atrial fibrillation, chronic (HCC) Rate controlled on metoprolol  with eliquis  for anticoagulation   CHF (congestive heart failure) (HCC) Euvolemic at this time, continues on metoprolol  with lasix  and aldactone  Follow bmp   Coronary artery disease Without chest pains, continues on metoprolol , eliquis , lipitor.   Essential hypertension Blood pressure overall well controlled, goal bp <140/90 Continue current medications and dietary modifications follow metabolic panel   Gastroesophageal reflux disease without esophagitis Stable on omeprazole.   Hyperlipidemia, mixed Continues on lipitor  Yearly lipid monitoring   Moderate dementia without behavioral disturbance, psychotic disturbance, mood disturbance, or anxiety (HCC) Stable, no acute changes in cognitive or functional status, continue supportive care.    Mycotic toenails -Consistent provided for toe nails to be trimmed, Nails debrided 10 without complication/bleeding She will need to be followed by podiatrist routinely due to thickness of toenails.    Obesity (BMI 30-39.9) BMI of 32, unable to exercise and diet is limited due to being in SNF.      Edvin Albus K. Caro BODILY [facility] [phone]

## 2023-09-08 ENCOUNTER — Encounter: Payer: Self-pay | Admitting: Nurse Practitioner

## 2023-09-08 DIAGNOSIS — K219 Gastro-esophageal reflux disease without esophagitis: Secondary | ICD-10-CM | POA: Insufficient documentation

## 2023-09-08 NOTE — Assessment & Plan Note (Signed)
-  Consistent provided for toe nails to be trimmed, Nails debrided 10 without complication/bleeding She will need to be followed by podiatrist routinely due to thickness of toenails.

## 2023-09-08 NOTE — Assessment & Plan Note (Signed)
 Continues on lipitor  Yearly lipid monitoring

## 2023-09-08 NOTE — Assessment & Plan Note (Signed)
" >>  ASSESSMENT AND PLAN FOR CHF (CONGESTIVE HEART FAILURE) (HCC) WRITTEN ON 09/08/2023 10:03 AM BY Shakiera Edelson K, NP  Euvolemic at this time, continues on metoprolol  with lasix  and aldactone  Follow bmp  "

## 2023-09-08 NOTE — Assessment & Plan Note (Signed)
 BMI of 32, unable to exercise and diet is limited due to being in SNF.

## 2023-09-08 NOTE — Assessment & Plan Note (Signed)
 Euvolemic at this time, continues on metoprolol  with lasix  and aldactone  Follow bmp

## 2023-09-08 NOTE — Assessment & Plan Note (Signed)
 Blood pressure overall well controlled, goal bp <140/90 Continue current medications and dietary modifications follow metabolic panel

## 2023-09-08 NOTE — Assessment & Plan Note (Signed)
 Rate controlled on metoprolol  with eliquis  for anticoagulation

## 2023-09-08 NOTE — Assessment & Plan Note (Signed)
 Stable, no acute changes in cognitive or functional status, continue supportive care.

## 2023-09-08 NOTE — Assessment & Plan Note (Signed)
 Without chest pains, continues on metoprolol , eliquis , lipitor.

## 2023-09-08 NOTE — Assessment & Plan Note (Signed)
 Stable on omeprazole.

## 2023-11-20 ENCOUNTER — Encounter: Payer: Self-pay | Admitting: Adult Health

## 2023-11-20 ENCOUNTER — Non-Acute Institutional Stay (SKILLED_NURSING_FACILITY): Payer: Self-pay | Admitting: Adult Health

## 2023-11-20 DIAGNOSIS — I10 Essential (primary) hypertension: Secondary | ICD-10-CM

## 2023-11-20 DIAGNOSIS — E782 Mixed hyperlipidemia: Secondary | ICD-10-CM

## 2023-11-20 DIAGNOSIS — F339 Major depressive disorder, recurrent, unspecified: Secondary | ICD-10-CM

## 2023-11-20 DIAGNOSIS — I509 Heart failure, unspecified: Secondary | ICD-10-CM | POA: Diagnosis not present

## 2023-11-20 DIAGNOSIS — K219 Gastro-esophageal reflux disease without esophagitis: Secondary | ICD-10-CM | POA: Diagnosis not present

## 2023-11-20 NOTE — Progress Notes (Signed)
 Location:  Other [facility].  Nursing Home Room Number: [facility] [id number] Place of Service:  SNF (31) Provider:  Phyllis Mutter, DNP, FNP-BC  Patient Care Team: Abdul Fine, MD as PCP - General (Family Medicine) Donette Ellouise LABOR, FNP as Nurse Practitioner (Family Medicine) Darron Deatrice LABOR, MD as Consulting Physician (Cardiology)  Extended Emergency Contact Information Primary Emergency Contact: [name],[name] Address: [address]          [address]          [place], [place] [address] [place] Home Phone: [phone] Mobile Phone: [phone] Relation: Daughter Secondary Emergency Contact: Jesalyn, Finazzo Mobile Phone: [phone] Relation: Son  Code Status:  DNR  Goals of care: Advanced Directive information    11/20/2023    2:34 PM  Advanced Directives  Does Patient Have a Medical Advance Directive? Yes  Type of Advance Directive Out of facility DNR (pink MOST or yellow form)  Does patient want to make changes to medical advance directive? No - Patient declined     Chief Complaint  Patient presents with   Medical Management of Chronic Issues    Medical Management of Chronic Issues.     HPI:  Pt is a 89 y.o. female seen today for medical management of chronic diseases. She is a resident of [facility].  Hyperlipidemia, mixed  -  takes atorvastatin   Essential hypertension  -  BP 142/76, 106/52, takes Toprol -XL 12.5 mg daily  Chronic congestive heart failure, unspecified heart failure type (HCC) no shortness of breath,-takes Lasix  and spironolactone   Gastroesophageal reflux disease without esophagitis -denies acid reflux,, takes omeprazole  Major depression, recurrent, chronic  -   takes sertraline    Past Medical History:  Diagnosis Date   Arthritis    CHF (congestive heart failure) (HCC)    Chronic atrial fibrillation (HCC)    Coronary artery disease    Non-ST elevation myocardial infarction in February 2017.  Cardiac catheterization showed significant two-vessel coronary artery disease affecting the right coronary artery and left circumflex with no significant disease involving the LAD. Ejection fraction was 30-35% with akinesis of the mid to distal and apical segments consistent with stress-induced cardiomyopathy.   HTN (hypertension)    Hyperlipidemia    MI (myocardial infarction) (HCC)    NSTEMI (non-ST elevated myocardial infarction) (HCC) 04/22/2015   Peripheral neuralgia    Past Surgical History:  Procedure Laterality Date   APPENDECTOMY     CARDIAC CATHETERIZATION N/A 04/23/2015   Procedure: Left Heart Cath and Coronary Angiography;  Surgeon: Deatrice LABOR Darron, MD;  Location: [facility] INVASIVE CV LAB;  Service: Cardiovascular;  Laterality: N/A;   LUMBAR SPINE SURGERY      No Known Allergies  Outpatient Encounter Medications as of 11/20/2023  Medication Sig   apixaban  (ELIQUIS ) 5 MG TABS tablet Take 1 tablet (5 mg total) by mouth 2 (two) times daily.   atorvastatin  (LIPITOR) 40 MG tablet Take 1 tablet (40 mg total) by mouth daily at 6 PM.   ferrous gluconate (FERGON) 324 MG tablet Take 324 mg by mouth daily with breakfast.   furosemide  (LASIX ) 20 MG tablet Take 20 mg by mouth daily.   furosemide  (LASIX ) 40 MG tablet Take 40 mg by mouth as needed. For 3lbs weight gain in 24 hours or 5lbs weight gain in 7 days.   metoprolol  succinate (TOPROL -XL) 25 MG 24 hr tablet Take 0.5 tablets (12.5 mg total) by mouth daily.   omeprazole (PRILOSEC) 20 MG capsule Take 20  mg by mouth daily.   OXYGEN Inhale into the lungs. 2lpm   potassium chloride  (KLOR-CON  M) 10 MEQ tablet Take 10 mEq by mouth daily.   sertraline  (ZOLOFT ) 25 MG tablet Take 25 mg by mouth daily.   spironolactone  (ALDACTONE ) 25 MG tablet Take 1 tablet (25 mg total) by mouth daily.   Zinc Oxide (TRIPLE PASTE) 12.8 % ointment Apply 1 Application topically as needed for irritation.   [DISCONTINUED] potassium chloride  (MICRO-K ) 10 MEQ CR capsule  Take 10 mEq by mouth daily.   No facility-administered encounter medications on file as of 11/20/2023.    Review of Systems  Constitutional:  Negative for appetite change, chills, fatigue and fever.  HENT:  Negative for congestion, hearing loss, rhinorrhea and sore throat.   Eyes: Negative.   Respiratory:  Negative for cough, shortness of breath and wheezing.   Cardiovascular:  Negative for chest pain, palpitations and leg swelling.  Gastrointestinal:  Negative for abdominal pain, constipation, diarrhea, nausea and vomiting.  Genitourinary:  Negative for dysuria.  Musculoskeletal:  Negative for arthralgias, back pain and myalgias.  Skin:  Negative for color change, rash and wound.  Neurological:  Negative for dizziness, weakness and headaches.  Psychiatric/Behavioral:  Negative for behavioral problems. The patient is not nervous/anxious.        Immunization History  Administered Date(s) Administered   Influenza Inj Mdck Quad Pf 11/17/2020, 11/22/2021   Influenza-Unspecified 12/27/2016, 12/08/2018, 12/28/2019, 03/05/2023   Moderna Sars-Covid-2 Vaccination 03/15/2019, 04/12/2019, 01/10/2020   PNEUMOCOCCAL CONJUGATE-20 03/04/2023   Pneumococcal Polysaccharide-23 03/27/2012, 04/02/2013   Zoster, Live 03/23/2015   Pertinent  Health Maintenance Due  Topic Date Due   Influenza Vaccine  09/29/2023   DEXA SCAN  Discontinued      05/05/2015    9:18 AM 06/02/2015   10:21 AM 12/07/2022    2:30 PM 01/18/2023    1:20 PM  Fall Risk  Falls in the past year? Yes  No  0 0  Was there an injury with Fall? Yes   0 0  Fall Risk Category Calculator   0 0  Patient at Risk for Falls Due to    No Fall Risks  Fall risk Follow up Falls prevention discussed    Falls evaluation completed     Data saved with a previous flowsheet row definition     Vitals:   11/20/23 1427 11/20/23 1435  BP: (!) 142/76 (!) 106/52  Pulse: 65   Resp: 20   Temp: 98.2 F (36.8 C)   SpO2: 96%   Weight: 168 lb (76.2  kg)   Height: 5' 3 (1.6 m)    Body mass index is 29.76 kg/m.  Physical Exam Constitutional:      Appearance: Normal appearance.  HENT:     Head: Normocephalic and atraumatic.     Nose: Nose normal.     Mouth/Throat:     Mouth: Mucous membranes are moist.  Eyes:     Conjunctiva/sclera: Conjunctivae normal.  Cardiovascular:     Rate and Rhythm: Normal rate and regular rhythm.  Pulmonary:     Effort: Pulmonary effort is normal.     Breath sounds: Normal breath sounds.  Abdominal:     General: Bowel sounds are normal.     Palpations: Abdomen is soft.  Musculoskeletal:        General: Normal range of motion.     Cervical back: Normal range of motion.     Right lower leg: Edema present.     Left lower  leg: Edema present.     Comments: Bilateral lower extremity trace edema  Skin:    General: Skin is warm and dry.  Neurological:     General: No focal deficit present.     Mental Status: She is alert and oriented to person, place, and time.  Psychiatric:        Mood and Affect: Mood normal.        Behavior: Behavior normal.        Thought Content: Thought content normal.        Judgment: Judgment normal.      Labs reviewed: Recent Labs    02/12/23 0345 02/14/23 0558 02/15/23 0601 02/16/23 0000 05/03/23 0000 05/22/23 0000 07/03/23 0000  NA 136 135 134*   < > 135* 135* 134*  K 3.4* 3.6 4.0   < > 4.7 4.2 4.4  CL 105 104 103   < > 105 102 103  CO2 22 25 25    < > 25* 28* 27*  GLUCOSE 132* 95 85  --   --   --   --   BUN 37* 25* 26*   < > 19 24* 33*  CREATININE 0.72 0.67 0.68   < > 0.7 0.8 0.8  CALCIUM  8.2* 8.2* 8.4*   < > 8.3* 8.0* 8.5*   < > = values in this interval not displayed.   Recent Labs    02/08/23 0320 02/10/23 0640 02/11/23 0513 05/03/23 0000  AST 20 16 15 15   ALT 11 9 13 7   ALKPHOS 96 103 102 183*  BILITOT 1.5* 1.0 0.7  --   PROT 6.0* 6.0* 5.4*  --   ALBUMIN 2.4* 2.3* 2.1* 3.3*   Recent Labs    02/10/23 0640 02/11/23 0513 02/12/23 0345  02/16/23 0000 05/03/23 0000 05/22/23 0000 07/03/23 0000  WBC 11.3* 10.7* 11.3*   < > 6.0 8.5 8.3  NEUTROABS  --   --   --    < > 4,740.00 6,367.00 5,901.00  HGB 13.0 11.8* 12.0   < > 14.0 12.1 12.8  HCT 37.7 34.5* 35.7*   < > 43 39 40  MCV 90.6 89.8 93.7  --   --   --   --   PLT 146* 169 207   < > 179 158 202   < > = values in this interval not displayed.   Lab Results  Component Value Date   TSH 2.29 12/12/2022   Lab Results  Component Value Date   HGBA1C 6.0 (H) 12/12/2022   Lab Results  Component Value Date   CHOL 104 12/12/2022   HDL 33 (L) 12/12/2022   LDLCALC 59 12/12/2022   TRIG 42 12/12/2022   CHOLHDL 3.2 12/12/2022    Significant Diagnostic Results in last 30 days:  No results found.  Assessment/Plan  1. Hyperlipidemia, mixed (Primary) Lab Results  Component Value Date   CHOL 104 12/12/2022   HDL 33 (L) 12/12/2022   LDLCALC 59 12/12/2022   TRIG 42 12/12/2022   CHOLHDL 3.2 12/12/2022    -  at goal -  continue atorvastatin  40 mg in the evening  2. Essential hypertension -   BP stable    - Continue Toprol -XL 12.5 mg daily  3. Chronic congestive heart failure, unspecified heart failure type (HCC) -   No shortness of breath -   Continue Lasix  20 mg daily -   Continue spironolactone  25 mg daily  4. Gastroesophageal reflux disease without esophagitis -   Denies heartburns -  Continue omeprazole 20 mg daily  5. Major depression, recurrent, chronic -   Was reading a book while sitting up in her room    Family/ staff Communication: Discussed plan of care with resident and charge nurse  Labs/tests ordered: None    Jereld Serum, DNP, MSN, FNP-BC [facility] [phone] (Monday-Friday 8:00 a.m. - 5:00 p.m.) [phone] (after hours)

## 2024-01-03 ENCOUNTER — Non-Acute Institutional Stay (SKILLED_NURSING_FACILITY): Admitting: Orthopedic Surgery

## 2024-01-03 ENCOUNTER — Encounter: Payer: Self-pay | Admitting: Orthopedic Surgery

## 2024-01-03 DIAGNOSIS — K219 Gastro-esophageal reflux disease without esophagitis: Secondary | ICD-10-CM

## 2024-01-03 DIAGNOSIS — I5022 Chronic systolic (congestive) heart failure: Secondary | ICD-10-CM

## 2024-01-03 DIAGNOSIS — Z5181 Encounter for therapeutic drug level monitoring: Secondary | ICD-10-CM | POA: Diagnosis not present

## 2024-01-03 DIAGNOSIS — I482 Chronic atrial fibrillation, unspecified: Secondary | ICD-10-CM | POA: Diagnosis not present

## 2024-01-03 DIAGNOSIS — F03B0 Unspecified dementia, moderate, without behavioral disturbance, psychotic disturbance, mood disturbance, and anxiety: Secondary | ICD-10-CM

## 2024-01-03 DIAGNOSIS — E669 Obesity, unspecified: Secondary | ICD-10-CM

## 2024-01-03 DIAGNOSIS — I10 Essential (primary) hypertension: Secondary | ICD-10-CM

## 2024-01-03 DIAGNOSIS — I2510 Atherosclerotic heart disease of native coronary artery without angina pectoris: Secondary | ICD-10-CM

## 2024-01-03 DIAGNOSIS — J9611 Chronic respiratory failure with hypoxia: Secondary | ICD-10-CM

## 2024-01-03 NOTE — Progress Notes (Signed)
 Location:  Other [facility].  Nursing Home Room Number: [facility] [id number] Place of Service:  SNF [phone]) Provider:  Greig Cluster, NP  Laurence Locus, DO  Patient Care Team: Laurence Locus, DO as PCP - General (Internal Medicine) Donette Ellouise LABOR, FNP as Nurse Practitioner (Family Medicine) Darron Deatrice LABOR, MD as Consulting Physician (Cardiology)  Extended Emergency Contact Information Primary Emergency Contact: [name],[name] Address: [address]          [address]          [place], [place] [address] [place] Home Phone: [phone] Mobile Phone: [phone] Relation: Daughter Secondary Emergency Contact: Alanna, Storti Mobile Phone: [phone] Relation: Son  Code Status:  DNR Goals of care: Advanced Directive information    11/20/2023    2:34 PM  Advanced Directives  Does Patient Have a Medical Advance Directive? Yes  Type of Advance Directive Out of facility DNR (pink MOST or yellow form)  Does patient want to make changes to medical advance directive? No - Patient declined     Chief Complaint  Patient presents with   Medical Management of Chronic Issues    Medical management of Chronic Issues.     HPI:  Pt is a 89 y.o. female seen today for medical management of chronic diseases.    She currently resides on the skilled nursing at [facility]. PMH: Atrial fib, CHF, CAD, HTN, PAD, GERD, dementia, venous stasis, thrombocytopenia, h/o uterine cancer, quadriplegia and obesity.   CHF- LVEF 30-35% 04/2015, weight trending down> see below, remains on spirolactone furosemide    PAF- TSH 2.29 12/12/2023, remains on metoprolol  and Eliquis  HTN- BUN/creat 33/0.8 07/03/2023, remains on metoprolol  CAD- LDL 59 12/12/2022, remains on atorvastatin  and metoprolol  Dementia- aphasia, no behaviors, 1+ assist with walker, dependent with ADLs except feeding, remains on Zoloft  GERD- hgb 12.8 07/03/2023, remains on omeprazole  Obesity- sedentary lifestyle, see below  No recent  falls per chart review.   Recent weights:  11/05- 167.1 lbs  10/07- 166.8 lbs  09/01- 174.8 lbs   Past Medical History:  Diagnosis Date   Arthritis    CHF (congestive heart failure) (HCC)    Chronic atrial fibrillation (HCC)    Coronary artery disease    Non-ST elevation myocardial infarction in February 2017. Cardiac catheterization showed significant two-vessel coronary artery disease affecting the right coronary artery and left circumflex with no significant disease involving the LAD. Ejection fraction was 30-35% with akinesis of the mid to distal and apical segments consistent with stress-induced cardiomyopathy.   HTN (hypertension)    Hyperlipidemia    MI (myocardial infarction) (HCC)    NSTEMI (non-ST elevated myocardial infarction) (HCC) 04/22/2015   Peripheral neuralgia    Past Surgical History:  Procedure Laterality Date   APPENDECTOMY     CARDIAC CATHETERIZATION N/A 04/23/2015   Procedure: Left Heart Cath and Coronary Angiography;  Surgeon: Deatrice LABOR Darron, MD;  Location: [facility] INVASIVE CV LAB;  Service: Cardiovascular;  Laterality: N/A;   LUMBAR SPINE SURGERY      No Known Allergies  Outpatient Encounter Medications as of 01/03/2024  Medication Sig   apixaban  (ELIQUIS ) 5 MG TABS tablet Take 1 tablet (5 mg total) by mouth 2 (two) times daily.   atorvastatin  (LIPITOR) 40 MG tablet Take 1 tablet (40 mg total) by mouth daily at 6 PM.   ferrous gluconate (FERGON) 324 MG tablet Take 324 mg by mouth daily with breakfast.   furosemide  (LASIX ) 20 MG tablet Take 20 mg by mouth daily.  furosemide  (LASIX ) 40 MG tablet Take 40 mg by mouth as needed. For 3lbs weight gain in 24 hours or 5lbs weight gain in 7 days.   metoprolol  succinate (TOPROL -XL) 25 MG 24 hr tablet Take 0.5 tablets (12.5 mg total) by mouth daily.   omeprazole (PRILOSEC) 20 MG capsule Take 20 mg by mouth daily.   OXYGEN Inhale into the lungs. 2lpm   potassium chloride  (KLOR-CON  M) 10 MEQ tablet Take 10 mEq by  mouth daily.   sertraline  (ZOLOFT ) 25 MG tablet Take 25 mg by mouth daily.   spironolactone  (ALDACTONE ) 25 MG tablet Take 1 tablet (25 mg total) by mouth daily.   Zinc Oxide (TRIPLE PASTE) 12.8 % ointment Apply 1 Application topically as needed for irritation.   No facility-administered encounter medications on file as of 01/03/2024.    Review of Systems  Unable to perform ROS: Dementia    Immunization History  Administered Date(s) Administered   Influenza Inj Mdck Quad Pf 11/17/2020, 11/22/2021   Influenza-Unspecified 12/27/2016, 12/08/2018, 12/28/2019, 03/05/2023, 12/12/2023   Moderna Sars-Covid-2 Vaccination 03/15/2019, 04/12/2019, 01/10/2020   PNEUMOCOCCAL CONJUGATE-20 03/04/2023   Pneumococcal Polysaccharide-23 03/27/2012, 04/02/2013   Zoster, Live 03/23/2015   Pertinent  Health Maintenance Due  Topic Date Due   Influenza Vaccine  Completed   DEXA SCAN  Discontinued      05/05/2015    9:18 AM 06/02/2015   10:21 AM 12/07/2022    2:30 PM 01/18/2023    1:20 PM  Fall Risk  Falls in the past year? Yes  No  0 0  Was there an injury with Fall? Yes   0 0  Fall Risk Category Calculator   0 0  Patient at Risk for Falls Due to    No Fall Risks  Fall risk Follow up Falls prevention discussed    Falls evaluation completed     Data saved with a previous flowsheet row definition   Functional Status Survey:    Vitals:   01/03/24 0939  BP: 126/70  Pulse: 64  Resp: (!) 25  Temp: 98 F (36.7 C)  SpO2: 90%  Weight: 167 lb 1.6 oz (75.8 kg)  Height: 5' 3 (1.6 m)   Body mass index is 29.6 kg/m. Physical Exam Vitals reviewed.  Constitutional:      General: She is not in acute distress.    Appearance: She is not ill-appearing.  HENT:     Head: Normocephalic.     Right Ear: There is no impacted cerumen.     Left Ear: There is no impacted cerumen.     Nose: Nose normal.     Mouth/Throat:     Mouth: Mucous membranes are moist.  Eyes:     General:        Right eye: No  discharge.        Left eye: No discharge.  Cardiovascular:     Rate and Rhythm: Normal rate and regular rhythm.     Pulses: Normal pulses.     Heart sounds: Normal heart sounds.  Pulmonary:     Effort: Pulmonary effort is normal.     Breath sounds: Normal breath sounds.     Comments: 2LNC Abdominal:     General: Bowel sounds are normal. There is no distension.     Palpations: Abdomen is soft.     Tenderness: There is no abdominal tenderness.  Musculoskeletal:     Cervical back: Neck supple.     Right lower leg: Edema present.  Left lower leg: Edema present.     Comments: Non pitting  Skin:    General: Skin is warm.     Capillary Refill: Capillary refill takes less than 2 seconds.  Neurological:     General: No focal deficit present.     Mental Status: She is alert. Mental status is at baseline.     Gait: Gait abnormal.     Comments: aphasia  Psychiatric:        Mood and Affect: Mood normal.     Comments: Follows commands, alert to self/person     Labs reviewed: Recent Labs    02/12/23 0345 02/14/23 0558 02/15/23 0601 02/16/23 0000 05/03/23 0000 05/22/23 0000 07/03/23 0000  NA 136 135 134*   < > 135* 135* 134*  K 3.4* 3.6 4.0   < > 4.7 4.2 4.4  CL 105 104 103   < > 105 102 103  CO2 22 25 25    < > 25* 28* 27*  GLUCOSE 132* 95 85  --   --   --   --   BUN 37* 25* 26*   < > 19 24* 33*  CREATININE 0.72 0.67 0.68   < > 0.7 0.8 0.8  CALCIUM  8.2* 8.2* 8.4*   < > 8.3* 8.0* 8.5*   < > = values in this interval not displayed.   Recent Labs    02/08/23 0320 02/10/23 0640 02/11/23 0513 05/03/23 0000  AST 20 16 15 15   ALT 11 9 13 7   ALKPHOS 96 103 102 183*  BILITOT 1.5* 1.0 0.7  --   PROT 6.0* 6.0* 5.4*  --   ALBUMIN 2.4* 2.3* 2.1* 3.3*   Recent Labs    02/10/23 0640 02/11/23 0513 02/12/23 0345 02/16/23 0000 05/03/23 0000 05/22/23 0000 07/03/23 0000  WBC 11.3* 10.7* 11.3*   < > 6.0 8.5 8.3  NEUTROABS  --   --   --    < > 4,740.00 6,367.00 5,901.00  HGB  13.0 11.8* 12.0   < > 14.0 12.1 12.8  HCT 37.7 34.5* 35.7*   < > 43 39 40  MCV 90.6 89.8 93.7  --   --   --   --   PLT 146* 169 207   < > 179 158 202   < > = values in this interval not displayed.   Lab Results  Component Value Date   TSH 2.29 12/12/2022   Lab Results  Component Value Date   HGBA1C 6.0 (H) 12/12/2022   Lab Results  Component Value Date   CHOL 104 12/12/2022   HDL 33 (L) 12/12/2022   LDLCALC 59 12/12/2022   TRIG 42 12/12/2022   CHOLHDL 3.2 12/12/2022    Significant Diagnostic Results in last 30 days:  No results found.  Assessment/Plan 1. Encounter for medication titration (Primary) - no recent GDR for sertraline  - plan to administer every other day x 7 days then discontinue   2. Chronic systolic heart failure (HCC) - LVEF 30-35% 2017 - no weight gain, cough, lungs clear - cont spirolactone and furosemide    3. Atrial fibrillation, chronic (HCC) - rate controlled with metoprolol  - cont Eliquid for clot prevention  4. Essential hypertension - controlled, goal < 150/90 - cont metoprolol   5. Coronary artery disease involving native coronary artery of native heart without angina pectoris - LDL stable - cont atorvastatin  and metoprolol   6. Moderate dementia without behavioral disturbance, psychotic disturbance, mood disturbance, or anxiety, unspecified dementia type (HCC) -  no behaviors - 1+ assist with walker - dependent with ADLs - aphasia - plan to wean off Zoloft   7. Gastroesophageal reflux disease without esophagitis - hgb stable - cont omeprazole  8. Obesity (BMI 30-39.9) - BMI 29.60 - recent weight decline  - sedentary lifestyle - cont weekly weight  9. Chronic respiratory failure with hypoxia (HCC) - cont oxygen     Family/ staff Communication: plan discussed with nurse  Labs/tests ordered:  none

## 2024-01-30 ENCOUNTER — Encounter: Payer: Self-pay | Admitting: Nurse Practitioner

## 2024-01-30 ENCOUNTER — Non-Acute Institutional Stay (SKILLED_NURSING_FACILITY): Admitting: Nurse Practitioner

## 2024-01-30 DIAGNOSIS — I10 Essential (primary) hypertension: Secondary | ICD-10-CM | POA: Diagnosis not present

## 2024-01-30 DIAGNOSIS — J9611 Chronic respiratory failure with hypoxia: Secondary | ICD-10-CM | POA: Insufficient documentation

## 2024-01-30 DIAGNOSIS — F03B0 Unspecified dementia, moderate, without behavioral disturbance, psychotic disturbance, mood disturbance, and anxiety: Secondary | ICD-10-CM

## 2024-01-30 DIAGNOSIS — D649 Anemia, unspecified: Secondary | ICD-10-CM | POA: Insufficient documentation

## 2024-01-30 DIAGNOSIS — I482 Chronic atrial fibrillation, unspecified: Secondary | ICD-10-CM | POA: Diagnosis not present

## 2024-01-30 DIAGNOSIS — K219 Gastro-esophageal reflux disease without esophagitis: Secondary | ICD-10-CM

## 2024-01-30 DIAGNOSIS — I2510 Atherosclerotic heart disease of native coronary artery without angina pectoris: Secondary | ICD-10-CM

## 2024-01-30 DIAGNOSIS — I5022 Chronic systolic (congestive) heart failure: Secondary | ICD-10-CM | POA: Diagnosis not present

## 2024-01-30 DIAGNOSIS — F339 Major depressive disorder, recurrent, unspecified: Secondary | ICD-10-CM | POA: Insufficient documentation

## 2024-01-30 NOTE — Progress Notes (Signed)
 Location:  Other [facility].  Nursing Home Room Number: [facility] [id number] Place of Service:  SNF [phone]) [name], NP  PCP: Laurence Locus, DO  Patient Care Team: Laurence Locus, DO as PCP - General (Internal Medicine) Donette Ellouise LABOR, FNP as Nurse Practitioner (Family Medicine) Darron Deatrice LABOR, MD as Consulting Physician (Cardiology)  Extended Emergency Contact Information Primary Emergency Contact: [name],[name] Address: [address]          [address]          [place], [place] [address] [place] Home Phone: [phone] Mobile Phone: [phone] Relation: Daughter Secondary Emergency Contact: Talesha, Ellithorpe Mobile Phone: [phone] Relation: Son  Goals of care: Advanced Directive information    01/30/2024    1:49 PM  Advanced Directives  Does Patient Have a Medical Advance Directive? Yes  Type of Advance Directive Out of facility DNR (pink MOST or yellow form)  Does patient want to make changes to medical advance directive? No - Patient declined     Chief Complaint  Patient presents with   Medical Management of Chronic Issues    Medical management of Chronic Issues.     HPI:  Pt is a 89 y.o. female seen today for medical management of chronic disease.  Pt with hx of a fib, chf, CAD ,htn, PAD, GERD, dementia and obesity.  She is at [facility] for long term care.  She eats well. Weight has been trending up in the last month. She denies any increase in LE edema, shortness of breath, cough or congestion.  Staff reports worsening memory loss. She is dependant of ADLs except for feeding.  She continues on metoprolol  due to a fib and htn.  No palpitations or chest pains reported.  Denies GERD.  Nursing has no concerns.   Past Medical History:  Diagnosis Date   Arthritis    CHF (congestive heart failure) (HCC)    Chronic atrial fibrillation (HCC)    Coronary artery disease    Non-ST elevation myocardial infarction in February 2017. Cardiac  catheterization showed significant two-vessel coronary artery disease affecting the right coronary artery and left circumflex with no significant disease involving the LAD. Ejection fraction was 30-35% with akinesis of the mid to distal and apical segments consistent with stress-induced cardiomyopathy.   HTN (hypertension)    Hyperlipidemia    MI (myocardial infarction) (HCC)    NSTEMI (non-ST elevated myocardial infarction) (HCC) 04/22/2015   Peripheral neuralgia    Past Surgical History:  Procedure Laterality Date   APPENDECTOMY     CARDIAC CATHETERIZATION N/A 04/23/2015   Procedure: Left Heart Cath and Coronary Angiography;  Surgeon: Deatrice LABOR Darron, MD;  Location: [facility] INVASIVE CV LAB;  Service: Cardiovascular;  Laterality: N/A;   LUMBAR SPINE SURGERY      No Known Allergies  Outpatient Encounter Medications as of 01/30/2024  Medication Sig   acetaminophen  (TYLENOL ) 500 MG tablet Take 1,000 mg by mouth every 8 (eight) hours as needed.   apixaban  (ELIQUIS ) 5 MG TABS tablet Take 1 tablet (5 mg total) by mouth 2 (two) times daily.   atorvastatin  (LIPITOR) 40 MG tablet Take 1 tablet (40 mg total) by mouth daily at 6 PM.   ferrous gluconate (FERGON) 324 MG tablet Take 324 mg by mouth daily with breakfast.   furosemide  (LASIX ) 20 MG tablet Take 20 mg by mouth daily.   furosemide  (LASIX ) 40 MG tablet Take 40 mg by mouth as needed. For 3lbs weight gain in 24 hours or  5lbs weight gain in 7 days.   metoprolol  succinate (TOPROL -XL) 25 MG 24 hr tablet Take 0.5 tablets (12.5 mg total) by mouth daily.   omeprazole (PRILOSEC) 20 MG capsule Take 20 mg by mouth daily.   OXYGEN Inhale into the lungs. 2lpm   potassium chloride  (KLOR-CON  M) 10 MEQ tablet Take 10 mEq by mouth daily.   spironolactone  (ALDACTONE ) 25 MG tablet Take 1 tablet (25 mg total) by mouth daily.   Zinc Oxide (TRIPLE PASTE) 12.8 % ointment Apply 1 Application topically as needed for irritation.   sertraline  (ZOLOFT ) 25 MG tablet  Take 25 mg by mouth every other day. (Patient not taking: Reported on 01/30/2024)   No facility-administered encounter medications on file as of 01/30/2024.    Review of Systems  Constitutional:  Negative for chills and fever.  HENT:  Negative for tinnitus.   Respiratory:  Negative for cough and shortness of breath.   Cardiovascular:  Negative for chest pain, palpitations and leg swelling.  Gastrointestinal:  Negative for abdominal pain, constipation and diarrhea.  Genitourinary:  Negative for dysuria, frequency and urgency.  Musculoskeletal:  Negative for back pain and myalgias.  Skin: Negative.   Neurological:  Negative for dizziness and headaches.     Immunization History  Administered Date(s) Administered   Influenza Inj Mdck Quad Pf 11/17/2020, 11/22/2021   Influenza-Unspecified 12/27/2016, 12/08/2018, 12/28/2019, 03/05/2023, 12/12/2023   Moderna Sars-Covid-2 Vaccination 03/15/2019, 04/12/2019, 01/10/2020   PNEUMOCOCCAL CONJUGATE-20 03/04/2023   Pneumococcal Polysaccharide-23 03/27/2012, 04/02/2013   Zoster, Live 03/23/2015   Pertinent  Health Maintenance Due  Topic Date Due   Influenza Vaccine  Completed   Bone Density Scan  Discontinued      05/05/2015    9:18 AM 06/02/2015   10:21 AM 12/07/2022    2:30 PM 01/18/2023    1:20 PM 01/03/2024   11:49 AM  Fall Risk  Falls in the past year? Yes  No  0 0 0  Was there an injury with Fall? Yes   0  0  0   Fall Risk Category Calculator   0 0 0  Patient at Risk for Falls Due to    No Fall Risks History of fall(s);Impaired balance/gait;Impaired mobility  Fall risk Follow up Falls prevention discussed    Falls evaluation completed Falls evaluation completed     Data saved with a previous flowsheet row definition   Functional Status Survey:    Vitals:   01/30/24 1344  BP: (!) 112/57  Pulse: 63  Resp: 16  Temp: (!) 96.4 F (35.8 C)  SpO2: 90%  Weight: 173 lb 6.4 oz (78.7 kg)  Height: 5' 3 (1.6 m)   Body mass index is  30.72 kg/m. Physical Exam Constitutional:      General: She is not in acute distress.    Appearance: She is well-developed. She is not diaphoretic.  HENT:     Head: Normocephalic and atraumatic.     Mouth/Throat:     Pharynx: No oropharyngeal exudate.  Eyes:     Conjunctiva/sclera: Conjunctivae normal.     Pupils: Pupils are equal, round, and reactive to light.  Cardiovascular:     Rate and Rhythm: Normal rate and regular rhythm.     Heart sounds: Normal heart sounds.  Pulmonary:     Effort: Pulmonary effort is normal.     Breath sounds: Normal breath sounds.  Abdominal:     General: Bowel sounds are normal.     Palpations: Abdomen is soft.  Musculoskeletal:  General: No tenderness.     Cervical back: Normal range of motion and neck supple.  Skin:    General: Skin is warm and dry.  Neurological:     Mental Status: She is alert.     Motor: Weakness present.     Gait: Gait abnormal.     Labs reviewed: Recent Labs    02/12/23 0345 02/14/23 0558 02/15/23 0601 02/16/23 0000 05/03/23 0000 05/22/23 0000 07/03/23 0000  NA 136 135 134*   < > 135* 135* 134*  K 3.4* 3.6 4.0   < > 4.7 4.2 4.4  CL 105 104 103   < > 105 102 103  CO2 22 25 25    < > 25* 28* 27*  GLUCOSE 132* 95 85  --   --   --   --   BUN 37* 25* 26*   < > 19 24* 33*  CREATININE 0.72 0.67 0.68   < > 0.7 0.8 0.8  CALCIUM  8.2* 8.2* 8.4*   < > 8.3* 8.0* 8.5*   < > = values in this interval not displayed.   Recent Labs    02/08/23 0320 02/10/23 0640 02/11/23 0513 05/03/23 0000  AST 20 16 15 15   ALT 11 9 13 7   ALKPHOS 96 103 102 183*  BILITOT 1.5* 1.0 0.7  --   PROT 6.0* 6.0* 5.4*  --   ALBUMIN 2.4* 2.3* 2.1* 3.3*   Recent Labs    02/10/23 0640 02/11/23 0513 02/12/23 0345 02/16/23 0000 05/03/23 0000 05/22/23 0000 07/03/23 0000  WBC 11.3* 10.7* 11.3*   < > 6.0 8.5 8.3  NEUTROABS  --   --   --    < > 4,740.00 6,367.00 5,901.00  HGB 13.0 11.8* 12.0   < > 14.0 12.1 12.8  HCT 37.7 34.5*  35.7*   < > 43 39 40  MCV 90.6 89.8 93.7  --   --   --   --   PLT 146* 169 207   < > 179 158 202   < > = values in this interval not displayed.   Lab Results  Component Value Date   TSH 2.29 12/12/2022   Lab Results  Component Value Date   HGBA1C 6.0 (H) 12/12/2022   Lab Results  Component Value Date   CHOL 104 12/12/2022   HDL 33 (L) 12/12/2022   LDLCALC 59 12/12/2022   TRIG 42 12/12/2022   CHOLHDL 3.2 12/12/2022    Significant Diagnostic Results in last 30 days:  No results found.  Assessment/Plan Moderate dementia without behavioral disturbance, psychotic disturbance, mood disturbance, or anxiety (HCC) Stable, no acute changes in cognitive or functional status, continue supportive care.    Major depression, recurrent, chronic Off antidepressant- no worsening of mood/depression reported. Will monitor.   Gastroesophageal reflux disease without esophagitis Stable on omeprazole.   Essential hypertension Blood pressure overall well controlled on metoprolol , aldactone  and lasix  Continue current medications and dietary modifications follow metabolic panel   Coronary artery disease Without chest pains, continues on metoprolol , eliquis , lipitor.  Update lipids  Chronic systolic heart failure (HCC) Euvolemic at this time Continues on lasix , aldactone  and metoprolol .   Chronic respiratory failure with hypoxia (HCC) Continues on long term O2  Atrial fibrillation, chronic (HCC) Rate controlled on metoprolol  with eliquis  for anticoagulation   Anemia Continues on iron supplement Will update cbc     Aneliese Beaudry K. Caro BODILY [facility] [phone]

## 2024-01-30 NOTE — Assessment & Plan Note (Signed)
 Blood pressure overall well controlled on metoprolol , aldactone  and lasix  Continue current medications and dietary modifications follow metabolic panel

## 2024-01-30 NOTE — Assessment & Plan Note (Signed)
 Continues on iron supplement Will update cbc

## 2024-01-30 NOTE — Assessment & Plan Note (Signed)
 Off antidepressant- no worsening of mood/depression reported. Will monitor.

## 2024-01-30 NOTE — Assessment & Plan Note (Signed)
 Stable on omeprazole.

## 2024-01-30 NOTE — Assessment & Plan Note (Signed)
 Rate controlled on metoprolol  with eliquis  for anticoagulation

## 2024-01-30 NOTE — Assessment & Plan Note (Signed)
 Stable, no acute changes in cognitive or functional status, continue supportive care.

## 2024-01-30 NOTE — Assessment & Plan Note (Signed)
 Without chest pains, continues on metoprolol , eliquis , lipitor.  Update lipids

## 2024-01-30 NOTE — Assessment & Plan Note (Signed)
 Continues on long term O2

## 2024-01-30 NOTE — Assessment & Plan Note (Signed)
 Euvolemic at this time Continues on lasix , aldactone  and metoprolol .

## 2024-02-01 LAB — COMPREHENSIVE METABOLIC PANEL WITH GFR
Albumin: 3.1 — AB (ref 3.5–5.0)
Calcium: 8.5 — AB (ref 8.7–10.7)
Globulin: 3.3
eGFR: 61

## 2024-02-01 LAB — HEPATIC FUNCTION PANEL
ALT: 8 U/L (ref 7–35)
AST: 14 (ref 13–35)
Alkaline Phosphatase: 156 — AB (ref 25–125)
Bilirubin, Total: 0.7

## 2024-02-01 LAB — BASIC METABOLIC PANEL WITH GFR
CO2: 30 — AB (ref 13–22)
Chloride: 101 (ref 99–108)
Creatinine: 0.9 (ref 0.5–1.1)
Glucose: 85
Potassium: 4.2 meq/L (ref 3.5–5.1)
Sodium: 138 (ref 137–147)

## 2024-02-01 LAB — LIPID PANEL
Cholesterol: 90 (ref 0–200)
HDL: 33 — AB (ref 35–70)
LDL Cholesterol: 43
Triglycerides: 49 (ref 40–160)

## 2024-02-01 LAB — CBC AND DIFFERENTIAL
HCT: 40 (ref 36–46)
Hemoglobin: 13 (ref 12.0–16.0)
Platelets: 205 K/uL (ref 150–400)
WBC: 7.2

## 2024-02-01 LAB — CBC: RBC: 4.34 (ref 3.87–5.11)

## 2024-02-15 ENCOUNTER — Non-Acute Institutional Stay (SKILLED_NURSING_FACILITY): Payer: Self-pay | Admitting: Nurse Practitioner

## 2024-02-15 ENCOUNTER — Encounter: Payer: Self-pay | Admitting: Nurse Practitioner

## 2024-02-15 DIAGNOSIS — B372 Candidiasis of skin and nail: Secondary | ICD-10-CM | POA: Diagnosis not present

## 2024-02-15 NOTE — Progress Notes (Signed)
 [facility]  N[facility]ing Home Room Number: [facility] [id number] Place of Service:  SNF [phone]) [name], NP  PCP: Laurence Locus, DO  Patient Care Team: Laurence Locus, DO as PCP - General (Internal Medicine) Donette Ellouise LABOR, FNP as Nurse Practitioner (Family Medicine) Darron Deatrice LABOR, MD as Consulting Physician (Cardiology)  Extended Emergency Contact Information Primary Emergency Contact: [name],[name] Address: [address]          [address]          [place], [place] [address] [place] Home Phone: [phone] Mobile Phone: [phone] Relation: Daughter Secondary Emergency Contact: Aalyssa, Elderkin Mobile Phone: [phone] Relation: Son  Goals of care: Advanced Directive information    01/30/2024    1:49 PM  Advanced Directives  Does Patient Have a Medical Advance Directive? Yes  Type of Advance Directive Out of facility DNR (pink MOST or yellow form)  Does patient want to make changes to medical advance directive? No - Patient declined     Chief Complaint  Patient presents with   Redness under Breast    Redness under Breast.     HPI:  Pt is a 89 y.o. female seen today for an acute visit for Redness under Breast.  Staff reports they have been applying nystatin  powder under right breast but becoming more red and malodorous.  Pt denies pain or itching.   Past Medical History:  Diagnosis Date   Arthritis    CHF (congestive heart failure) (HCC)    Chronic atrial fibrillation (HCC)    Coronary artery disease    Non-ST elevation myocardial infarction in February 2017. Cardiac catheterization showed significant two-vessel coronary artery disease affecting the right coronary artery and left circumflex with no significant disease involving the LAD. Ejection fraction was 30-35% with akinesis of the mid to distal and apical segments consistent with stress-induced cardiomyopathy.   HTN (hypertension)    Hyperlipidemia    MI (myocardial  infarction) (HCC)    NSTEMI (non-ST elevated myocardial infarction) (HCC) 04/22/2015   Peripheral neuralgia    Past Surgical History:  Procedure Laterality Date   APPENDECTOMY     CARDIAC CATHETERIZATION N/A 04/23/2015   Procedure: Left Heart Cath and Coronary Angiography;  Surgeon: Deatrice LABOR Darron, MD;  Location: [facility] INVASIVE CV LAB;  Service: Cardiovascular;  Laterality: N/A;   LUMBAR SPINE SURGERY      Allergies[1]  Outpatient Encounter Medications as of 02/15/2024  Medication Sig   acetaminophen  (TYLENOL ) 500 MG tablet Take 1,000 mg by mouth every 8 (eight) hours as needed.   apixaban  (ELIQUIS ) 5 MG TABS tablet Take 1 tablet (5 mg total) by mouth 2 (two) times daily.   atorvastatin  (LIPITOR) 40 MG tablet Take 1 tablet (40 mg total) by mouth daily at 6 PM.   ferrous gluconate (FERGON) 324 MG tablet Take 324 mg by mouth daily with breakfast.   fluconazole  (DIFLUCAN ) 100 MG tablet Take 100 mg by mouth at bedtime.   furosemide  (LASIX ) 20 MG tablet Take 20 mg by mouth daily.   furosemide  (LASIX ) 40 MG tablet Take 40 mg by mouth as needed. For 3lbs weight gain in 24 hours or 5lbs weight gain in 7 days.   metoprolol  succinate (TOPROL -XL) 25 MG 24 hr tablet Take 0.5 tablets (12.5 mg total) by mouth daily.   nystatin  cream (MYCOSTATIN ) Apply 1 Application topically 2 (two) times daily.   omeprazole (PRILOSEC) 20 MG capsule Take 20 mg by mouth daily.   OXYGEN Inhale into the lungs.  2lpm   potassium chloride  (KLOR-CON  M) 10 MEQ tablet Take 10 mEq by mouth daily.   spironolactone  (ALDACTONE ) 25 MG tablet Take 1 tablet (25 mg total) by mouth daily.   Zinc Oxide (TRIPLE PASTE) 12.8 % ointment Apply 1 Application topically as needed for irritation.   sertraline  (ZOLOFT ) 25 MG tablet Take 25 mg by mouth every other day. (Patient not taking: Reported on 02/15/2024)   No facility-administered encounter medications on file as of 02/15/2024.    Review of Systems  Unable to perform ROS: Dementia     Immunization History  Administered Date(s) Administered   Influenza Inj Mdck Quad Pf 11/17/2020, 11/22/2021   Influenza-Unspecified 12/27/2016, 12/08/2018, 12/28/2019, 03/05/2023, 12/12/2023   Moderna Sars-Covid-2 Vaccination 03/15/2019, 04/12/2019, 01/10/2020   PNEUMOCOCCAL CONJUGATE-20 03/04/2023   Pneumococcal Polysaccharide-23 03/27/2012, 04/02/2013   Zoster, Live 03/23/2015   Pertinent  Health Maintenance Due  Topic Date Due   Influenza Vaccine  Completed   Bone Density Scan  Discontinued      05/05/2015    9:18 AM 06/02/2015   10:21 AM 12/07/2022    2:30 PM 01/18/2023    1:20 PM 01/03/2024   11:49 AM  Fall Risk  Falls in the past year? Yes  No  0 0 0  Was there an injury with Fall? Yes   0  0  0   Fall Risk Category Calculator   0 0 0  Patient at Risk for Falls Due to    No Fall Risks History of fall(s);Impaired balance/gait;Impaired mobility  Fall risk Follow up Falls prevention discussed    Falls evaluation completed Falls evaluation completed     Data saved with a previous flowsheet row definition   Functional Status Survey:    Vitals:   02/15/24 1325  BP: 126/60  Pulse: 60  Resp: 18  Temp: (!) 97.3 F (36.3 C)  SpO2: 97%  Weight: 174 lb (78.9 kg)  Height: 5' 3 (1.6 m)   Body mass index is 30.82 kg/m. Physical Exam Skin:    Findings: Rash (redness under breast) present.     Labs reviewed: Recent Labs    05/03/23 0000 05/22/23 0000 07/03/23 0000 02/01/24 0000  NA 135* 135* 134* 138  K 4.7 4.2 4.4 4.2  CL 105 102 103 101  CO2 25* 28* 27* 30*  BUN 19 24* 33*  --   CREATININE 0.7 0.8 0.8 0.9  CALCIUM  8.3* 8.0* 8.5* 8.5*   Recent Labs    05/03/23 0000 02/01/24 0000  AST 15 14  ALT 7 8  ALKPHOS 183* 156*  ALBUMIN 3.3* 3.1*   Recent Labs    05/03/23 0000 05/22/23 0000 07/03/23 0000 02/01/24 0000  WBC 6.0 8.5 8.3 7.2  NEUTROABS 4,740.00 6,367.00 5,901.00  --   HGB 14.0 12.1 12.8 13.0  HCT 43 39 40 40  PLT 179 158 202 205   Lab  Results  Component Value Date   TSH 2.29 12/12/2022   Lab Results  Component Value Date   HGBA1C 6.0 (H) 12/12/2022   Lab Results  Component Value Date   CHOL 90 02/01/2024   HDL 33 (A) 02/01/2024   LDLCALC 43 02/01/2024   TRIG 49 02/01/2024   CHOLHDL 3.2 12/12/2022    Significant Diagnostic Results in last 30 days:  No results found.  Assessment/Plan 1. Yeast infection of the skin (Primary) -will start diflucan  100 mg daily for 3 days Dc nystatin  powder and add nystatin  ointment BID To clean and dry skin thoroughly  before applying Nursing to monitor and notify for worsening rash.    Allexa Acoff K. Caro BODILY [facility] [phone]       [1] No Known Allergies

## 2024-03-08 ENCOUNTER — Encounter: Payer: Self-pay | Admitting: Internal Medicine

## 2024-03-08 ENCOUNTER — Non-Acute Institutional Stay (SKILLED_NURSING_FACILITY): Admitting: Internal Medicine

## 2024-03-08 DIAGNOSIS — F339 Major depressive disorder, recurrent, unspecified: Secondary | ICD-10-CM | POA: Diagnosis not present

## 2024-03-08 DIAGNOSIS — K219 Gastro-esophageal reflux disease without esophagitis: Secondary | ICD-10-CM | POA: Diagnosis not present

## 2024-03-08 DIAGNOSIS — I482 Chronic atrial fibrillation, unspecified: Secondary | ICD-10-CM | POA: Diagnosis not present

## 2024-03-08 DIAGNOSIS — G301 Alzheimer's disease with late onset: Secondary | ICD-10-CM

## 2024-03-08 DIAGNOSIS — E669 Obesity, unspecified: Secondary | ICD-10-CM | POA: Diagnosis not present

## 2024-03-08 DIAGNOSIS — Z7901 Long term (current) use of anticoagulants: Secondary | ICD-10-CM | POA: Diagnosis not present

## 2024-03-08 DIAGNOSIS — I10 Essential (primary) hypertension: Secondary | ICD-10-CM

## 2024-03-08 DIAGNOSIS — I872 Venous insufficiency (chronic) (peripheral): Secondary | ICD-10-CM | POA: Diagnosis not present

## 2024-03-08 DIAGNOSIS — E782 Mixed hyperlipidemia: Secondary | ICD-10-CM

## 2024-03-08 DIAGNOSIS — I5022 Chronic systolic (congestive) heart failure: Secondary | ICD-10-CM

## 2024-03-08 DIAGNOSIS — J9611 Chronic respiratory failure with hypoxia: Secondary | ICD-10-CM | POA: Diagnosis not present

## 2024-03-08 DIAGNOSIS — R532 Functional quadriplegia: Secondary | ICD-10-CM

## 2024-03-08 DIAGNOSIS — H9190 Unspecified hearing loss, unspecified ear: Secondary | ICD-10-CM

## 2024-03-08 NOTE — Progress Notes (Unsigned)
 [facility] Routine Visit Progress Note    Location:  [facility].  Nursing Home Room Number: [facility] [id number] Place of Service:  SNF (31)   Laurence Locus, DO   Patient Care Team: Laurence Locus, DO as PCP - General (Internal Medicine) Donette Ellouise LABOR, FNP as Nurse Practitioner (Family Medicine) Darron Deatrice LABOR, MD as Consulting Physician (Cardiology)   Extended Emergency Contact Information Primary Emergency Contact: [name],[name] Address: [address]          [address]          [place], [place] [address] [place] Home Phone: [phone] Mobile Phone: [phone] Relation: Daughter Secondary Emergency Contact: Brynnley, Dayrit Mobile Phone: [phone] Relation: Son   Goals of care: Advanced Directive information    01/30/2024    1:49 PM  Advanced Directives  Does Patient Have a Medical Advance Directive? Yes  Type of Advance Directive Out of facility DNR (pink MOST or yellow form)  Does patient want to make changes to medical advance directive? No - Patient declined    CODE STATUS: Do Not Resuscitate (DNR)   Chief Complaint  Patient presents with   Medical Management of Chronic Issues    Medical Management of Chronic Issues.      HPI: Pt is a 89 y.o. female seen today for medical management of chronic disease.   Mrs. Yero is a 89 yo WF with hx of chronic hypoxic respiratory failure on home O2 @ 3L/min, functional quadriplegia, chronic systolic CHF, chronic afib on systemic anticoagulation, moderate Alzheimer's dementia without behavioral disturbance who is being seen at [facility] for long term care management.    Pt was asleep in her recliner when I spoke with her. She states she likes to be in her recliner. She sleeps in her recliner. Was reading a [employer] romance novel.   Past Medical History:  Diagnosis Date   Arthritis    CHF (congestive heart failure) (HCC)    Chronic atrial fibrillation (HCC)    Coronary artery  disease    Non-ST elevation myocardial infarction in February 2017. Cardiac catheterization showed significant two-vessel coronary artery disease affecting the right coronary artery and left circumflex with no significant disease involving the LAD. Ejection fraction was 30-35% with akinesis of the mid to distal and apical segments consistent with stress-induced cardiomyopathy.   HTN (hypertension)    Hyperlipidemia    MI (myocardial infarction) (HCC)    NSTEMI (non-ST elevated myocardial infarction) (HCC) 04/22/2015   Peripheral neuralgia    Past Surgical History:  Procedure Laterality Date   APPENDECTOMY     CARDIAC CATHETERIZATION N/A 04/23/2015   Procedure: Left Heart Cath and Coronary Angiography;  Surgeon: Deatrice LABOR Darron, MD;  Location: [facility] INVASIVE CV LAB;  Service: Cardiovascular;  Laterality: N/A;   LUMBAR SPINE SURGERY       Allergies[1]   Outpatient Encounter Medications as of 03/08/2024  Medication Sig   acetaminophen  (TYLENOL ) 500 MG tablet Take 1,000 mg by mouth every 8 (eight) hours as needed.   apixaban  (ELIQUIS ) 5 MG TABS tablet Take 1 tablet (5 mg total) by mouth 2 (two) times daily.   atorvastatin  (LIPITOR) 40 MG tablet Take 1 tablet (40 mg total) by mouth daily at 6 PM.   ferrous gluconate (FERGON) 324 MG tablet Take 324 mg by mouth daily with breakfast.   furosemide  (LASIX ) 20 MG tablet Take 20 mg by mouth daily.   furosemide  (LASIX ) 40 MG tablet Take 40 mg  by mouth as needed. For 3lbs weight gain in 24 hours or 5lbs weight gain in 7 days.   metoprolol  succinate (TOPROL -XL) 25 MG 24 hr tablet Take 0.5 tablets (12.5 mg total) by mouth daily.   omeprazole (PRILOSEC) 20 MG capsule Take 20 mg by mouth daily.   OXYGEN Inhale into the lungs. 2lpm   potassium chloride  (KLOR-CON  M) 10 MEQ tablet Take 10 mEq by mouth daily.   spironolactone  (ALDACTONE ) 25 MG tablet Take 1 tablet (25 mg total) by mouth daily.   Zinc Oxide (TRIPLE PASTE) 12.8 % ointment Apply 1 Application  topically as needed for irritation.   fluconazole  (DIFLUCAN ) 100 MG tablet Take 100 mg by mouth at bedtime. (Patient not taking: Reported on 03/08/2024)   nystatin  cream (MYCOSTATIN ) Apply 1 Application topically 2 (two) times daily. (Patient not taking: Reported on 03/08/2024)   sertraline  (ZOLOFT ) 25 MG tablet Take 25 mg by mouth every other day. (Patient not taking: Reported on 03/08/2024)   No facility-administered encounter medications on file as of 03/08/2024.     Review of Systems  Constitutional: Negative.   HENT: Negative.    Eyes: Negative.   Respiratory: Negative.    Cardiovascular: Negative.   Gastrointestinal: Negative.   Endocrine: Negative.   Genitourinary: Negative.   Allergic/Immunologic: Negative.   Hematological: Negative.   Psychiatric/Behavioral: Negative.    All other systems reviewed and are negative.     Immunization History  Administered Date(s) Administered   Influenza Inj Mdck Quad Pf 11/17/2020, 11/22/2021   Influenza-Unspecified 12/27/2016, 12/08/2018, 12/28/2019, 03/05/2023, 12/12/2023   Moderna Sars-Covid-2 Vaccination 03/15/2019, 04/12/2019, 01/10/2020   PNEUMOCOCCAL CONJUGATE-20 03/04/2023   Pneumococcal Polysaccharide-23 03/27/2012, 04/02/2013   Zoster, Live 03/23/2015   Pertinent  Health Maintenance Due  Topic Date Due   Influenza Vaccine  Completed   Bone Density Scan  Discontinued      05/05/2015    9:18 AM 06/02/2015   10:21 AM 12/07/2022    2:30 PM 01/18/2023    1:20 PM 01/03/2024   11:49 AM  Fall Risk  Falls in the past year? Yes  No  0 0 0  Was there an injury with Fall? Yes   0  0  0   Fall Risk Category Calculator   0 0 0  Patient at Risk for Falls Due to    No Fall Risks History of fall(s);Impaired balance/gait;Impaired mobility  Fall risk Follow up Falls prevention discussed    Falls evaluation completed Falls evaluation completed     Data saved with a previous flowsheet row definition   Functional Status Survey:     Vitals:    03/08/24 1512  BP: 117/67  Pulse: 68  Resp: 18  Temp: (!) 96.4 F (35.8 C)  SpO2: 94%  Weight: 169 lb (76.7 kg)  Height: 5' 3 (1.6 m)   Body mass index is 29.94 kg/m. Physical Exam Vitals and nursing note reviewed.  Constitutional:      Appearance: She is obese.     Comments: Elderly female. Was sleeping in her recliner when I entered her room. Arousable to loud verbal stimuli.  HENT:     Head: Normocephalic and atraumatic.     Nose: Nose normal.  Eyes:     General: No scleral icterus. Cardiovascular:     Rate and Rhythm: Normal rate and regular rhythm.  Pulmonary:     Effort: Pulmonary effort is normal. No respiratory distress.  Abdominal:     General: Bowel sounds are normal. There is no  distension.     Palpations: Abdomen is soft.  Skin:    General: Skin is warm and dry.     Capillary Refill: Capillary refill takes less than 2 seconds.     Comments: Chronic venous stasis of both lower legs. No apparent skin break down. No anterior lower leg ulcers seen.  Neurological:     Comments: Not oriented to time, knows her name.      Labs reviewed: Recent Labs    05/03/23 0000 05/22/23 0000 07/03/23 0000 02/01/24 0000  NA 135* 135* 134* 138  K 4.7 4.2 4.4 4.2  CL 105 102 103 101  CO2 25* 28* 27* 30*  BUN 19 24* 33*  --   CREATININE 0.7 0.8 0.8 0.9  CALCIUM  8.3* 8.0* 8.5* 8.5*   Recent Labs    05/03/23 0000 02/01/24 0000  AST 15 14  ALT 7 8  ALKPHOS 183* 156*  ALBUMIN 3.3* 3.1*   Recent Labs    05/03/23 0000 05/22/23 0000 07/03/23 0000 02/01/24 0000  WBC 6.0 8.5 8.3 7.2  NEUTROABS 4,740.00 6,367.00 5,901.00  --   HGB 14.0 12.1 12.8 13.0  HCT 43 39 40 40  PLT 179 158 202 205   Lab Results  Component Value Date   TSH 2.29 12/12/2022   Lab Results  Component Value Date   HGBA1C 6.0 (H) 12/12/2022   Lab Results  Component Value Date   CHOL 90 02/01/2024   HDL 33 (A) 02/01/2024   LDLCALC 43 02/01/2024   TRIG 49 02/01/2024   CHOLHDL 3.2  12/12/2022    Assessment/Plan Chronic systolic heart failure (HCC) Continue with GDMT with lasix  20 mg daily, Toprol -XL 12.5 mg daily, aldactone  25 mg daily, prn lasix  40 mg for weight gain. BP ok but given her advanced age of 57, I'm hesitant to add ARB/ACEI.  Last echo from 04-2015 that showed LVEF of 30%. She is essentially wheelchair bound. I don't think there is a utility in updating her echo at this point.  Essential hypertension BP is stable on lasix , aldactone  and Toprol -XL. Given her advanced age of 83, will not add any ACEI/ARB at this time.  Atrial fibrillation, chronic (HCC) On Eliquis  and Toprol -XL  Major depression, recurrent, chronic Stable. Not on any anti-depressants at this time.  Moderate dementia without behavioral disturbance, psychotic disturbance, mood disturbance, or anxiety (HCC) Chronic. Not on any anti-psychotics.   Current use of long term anticoagulation - on Eliquis  for afib Continue Eliquis  for afib  Obesity (BMI 30-39.9) Body mass index is 29.94 kg/m.   Quadriplegia, functional (HCC) Pt is wheelchair bound  Venous stasis dermatitis Chronic. No open wounds at this time.  Gastroesophageal reflux disease without esophagitis Stable. On prilosec 20 mg daily.  Hyperlipidemia, mixed Continue with lipitor 40 mg daily.  Chronic respiratory failure with hypoxia (HCC) - Remains on 2-3 L/min. Chronic. Remains on 2-3 L/min.   Hard of hearing Chronic. Pt is very hard of hearing. She had the TV volume turned up very loud when I entered her room.    Camellia Door, DO  [facility] [phone]      [1] No Known Allergies

## 2024-03-09 NOTE — Assessment & Plan Note (Signed)
 Chronic. Remains on 2-3 L/min.

## 2024-03-09 NOTE — Assessment & Plan Note (Signed)
 Chronic. Pt is very hard of hearing. She had the TV volume turned up very loud when I entered her room.

## 2024-03-09 NOTE — Assessment & Plan Note (Signed)
 Pt is wheelchair bound.

## 2024-03-09 NOTE — Assessment & Plan Note (Signed)
 Chronic. No open wounds at this time.

## 2024-03-09 NOTE — Assessment & Plan Note (Signed)
 On Eliquis  and Toprol -XL

## 2024-03-09 NOTE — Assessment & Plan Note (Signed)
Continue with lipitor 40 mg daily. 

## 2024-03-09 NOTE — Assessment & Plan Note (Signed)
 Stable. Not on any anti-depressants at this time.

## 2024-03-09 NOTE — Assessment & Plan Note (Signed)
 BP is stable on lasix , aldactone  and Toprol -XL. Given her advanced age of 29, will not add any ACEI/ARB at this time.

## 2024-03-09 NOTE — Assessment & Plan Note (Signed)
 Chronic. Not on any anti-psychotics.

## 2024-03-09 NOTE — Assessment & Plan Note (Signed)
 Continue Eliquis for a-fib.

## 2024-03-09 NOTE — Assessment & Plan Note (Signed)
Body mass index is 29.94 kg/m².

## 2024-03-09 NOTE — Assessment & Plan Note (Signed)
 Stable. On prilosec 20 mg daily.

## 2024-03-09 NOTE — Assessment & Plan Note (Signed)
 Continue with GDMT with lasix  20 mg daily, Toprol -XL 12.5 mg daily, aldactone  25 mg daily, prn lasix  40 mg for weight gain. BP ok but given her advanced age of 76, I'm hesitant to add ARB/ACEI.  Last echo from 04-2015 that showed LVEF of 30%. She is essentially wheelchair bound. I don't think there is a utility in updating her echo at this point.

## 2024-04-04 ENCOUNTER — Encounter: Payer: Self-pay | Admitting: Nurse Practitioner

## 2024-04-04 ENCOUNTER — Non-Acute Institutional Stay: Payer: Self-pay | Admitting: Nurse Practitioner

## 2024-04-04 NOTE — Progress Notes (Unsigned)
 " Location:  [facility].  Nursing Home Room Number: [facility] [id number] Place of Service:  SNF [phone]) [name], NP  PCP: Laurence Locus, DO  Patient Care Team: Laurence Locus, DO as PCP - General (Internal Medicine) Donette Ellouise LABOR, FNP as Nurse Practitioner (Family Medicine) Darron Deatrice LABOR, MD as Consulting Physician (Cardiology)  Extended Emergency Contact Information Primary Emergency Contact: [name],[name] Address: [address]          [address]          [place], [place] [address] [place] Home Phone: [phone] Mobile Phone: [phone] Relation: Daughter Secondary Emergency Contact: Sumire, Halbleib Mobile Phone: [phone] Relation: Son  Goals of care: Advanced Directive information    04/04/2024   10:31 AM  Advanced Directives  Does Patient Have a Medical Advance Directive? Yes  Type of Advance Directive Out of facility DNR (pink MOST or yellow form)  Does patient want to make changes to medical advance directive? No - Patient declined     Chief Complaint  Patient presents with   Medical Management of Chronic Issues    Medical Management of Chronic Issues.     HPI:  Pt is a 89 y.o. female seen today for medical management of chronic disease.    Past Medical History:  Diagnosis Date   Arthritis    CHF (congestive heart failure) (HCC)    Chronic atrial fibrillation (HCC)    Coronary artery disease    Non-ST elevation myocardial infarction in February 2017. Cardiac catheterization showed significant two-vessel coronary artery disease affecting the right coronary artery and left circumflex with no significant disease involving the LAD. Ejection fraction was 30-35% with akinesis of the mid to distal and apical segments consistent with stress-induced cardiomyopathy.   HTN (hypertension)    Hyperlipidemia    MI (myocardial infarction) (HCC)    NSTEMI (non-ST elevated myocardial infarction) (HCC) 04/22/2015   Peripheral neuralgia    Past  Surgical History:  Procedure Laterality Date   APPENDECTOMY     CARDIAC CATHETERIZATION N/A 04/23/2015   Procedure: Left Heart Cath and Coronary Angiography;  Surgeon: Deatrice LABOR Darron, MD;  Location: [facility] INVASIVE CV LAB;  Service: Cardiovascular;  Laterality: N/A;   LUMBAR SPINE SURGERY      Allergies[1]  Outpatient Encounter Medications as of 04/04/2024  Medication Sig   acetaminophen  (TYLENOL ) 500 MG tablet Take 1,000 mg by mouth every 8 (eight) hours as needed.   apixaban  (ELIQUIS ) 5 MG TABS tablet Take 1 tablet (5 mg total) by mouth 2 (two) times daily.   atorvastatin  (LIPITOR) 40 MG tablet Take 1 tablet (40 mg total) by mouth daily at 6 PM.   ferrous gluconate (FERGON) 324 MG tablet Take 324 mg by mouth daily with breakfast.   furosemide  (LASIX ) 20 MG tablet Take 20 mg by mouth daily.   furosemide  (LASIX ) 40 MG tablet Take 40 mg by mouth as needed. For 3lbs weight gain in 24 hours or 5lbs weight gain in 7 days.   metoprolol  succinate (TOPROL -XL) 25 MG 24 hr tablet Take 0.5 tablets (12.5 mg total) by mouth daily.   omeprazole (PRILOSEC) 20 MG capsule Take 20 mg by mouth daily.   OXYGEN Inhale into the lungs. 2lpm   polyethylene glycol (MIRALAX  / GLYCOLAX ) 17 g packet Take 17 g by mouth daily as needed.   potassium chloride  (KLOR-CON  M) 10 MEQ tablet Take 10 mEq by mouth daily.   spironolactone  (ALDACTONE ) 25 MG tablet Take 1 tablet (25 mg total)  by mouth daily.   Zinc Oxide (TRIPLE PASTE) 12.8 % ointment Apply 1 Application topically as needed for irritation.   fluconazole  (DIFLUCAN ) 100 MG tablet Take 100 mg by mouth at bedtime. (Patient not taking: Reported on 04/04/2024)   nystatin  cream (MYCOSTATIN ) Apply 1 Application topically 2 (two) times daily. (Patient not taking: Reported on 04/04/2024)   sertraline  (ZOLOFT ) 25 MG tablet Take 25 mg by mouth every other day. (Patient not taking: Reported on 04/04/2024)   No facility-administered encounter medications on file as of 04/04/2024.     Review of Systems ***  Immunization History  Administered Date(s) Administered   Influenza Inj Mdck Quad Pf 11/17/2020, 11/22/2021   Influenza-Unspecified 12/27/2016, 12/08/2018, 12/28/2019, 03/05/2023, 12/12/2023   Moderna Sars-Covid-2 Vaccination 03/15/2019, 04/12/2019, 01/10/2020   PNEUMOCOCCAL CONJUGATE-20 03/04/2023   Pneumococcal Polysaccharide-23 03/27/2012, 04/02/2013   Zoster, Live 03/23/2015   Pertinent  Health Maintenance Due  Topic Date Due   Influenza Vaccine  Completed   Bone Density Scan  Discontinued      05/05/2015    9:18 AM 06/02/2015   10:21 AM 12/07/2022    2:30 PM 01/18/2023    1:20 PM 01/03/2024   11:49 AM  Fall Risk  Falls in the past year? Yes  No  0 0 0  Was there an injury with Fall? Yes   0  0  0   Fall Risk Category Calculator   0 0 0  Patient at Risk for Falls Due to    No Fall Risks History of fall(s);Impaired balance/gait;Impaired mobility  Fall risk Follow up Falls prevention discussed    Falls evaluation completed Falls evaluation completed     Data saved with a previous flowsheet row definition   Functional Status Survey:    Vitals:   04/04/24 1024  BP: 136/70  Pulse: 65  Resp: 18  Temp: (!) 97 F (36.1 C)  SpO2: 94%  Weight: 169 lb (76.7 kg)  Height: 5' 3 (1.6 m)   Body mass index is 29.94 kg/m. Physical Exam***  Labs reviewed: Recent Labs    05/03/23 0000 05/22/23 0000 07/03/23 0000 02/01/24 0000  NA 135* 135* 134* 138  K 4.7 4.2 4.4 4.2  CL 105 102 103 101  CO2 25* 28* 27* 30*  BUN 19 24* 33*  --   CREATININE 0.7 0.8 0.8 0.9  CALCIUM  8.3* 8.0* 8.5* 8.5*   Recent Labs    05/03/23 0000 02/01/24 0000  AST 15 14  ALT 7 8  ALKPHOS 183* 156*  ALBUMIN 3.3* 3.1*   Recent Labs    05/03/23 0000 05/22/23 0000 07/03/23 0000 02/01/24 0000  WBC 6.0 8.5 8.3 7.2  NEUTROABS 4,740.00 6,367.00 5,901.00  --   HGB 14.0 12.1 12.8 13.0  HCT 43 39 40 40  PLT 179 158 202 205   Lab Results  Component Value Date   TSH  2.29 12/12/2022   Lab Results  Component Value Date   HGBA1C 6.0 (H) 12/12/2022   Lab Results  Component Value Date   CHOL 90 02/01/2024   HDL 33 (A) 02/01/2024   LDLCALC 43 02/01/2024   TRIG 49 02/01/2024   CHOLHDL 3.2 12/12/2022    Significant Diagnostic Results in last 30 days:  No results found.  Assessment/Plan No problem-specific Assessment & Plan notes found for this encounter.     Jessica K. Caro BODILY [facility] [phone]      [1] No Known Allergies  "
